# Patient Record
Sex: Female | Born: 1945 | Race: White | Hispanic: No | Marital: Married | Smoking: Former smoker
Health system: Southern US, Community
[De-identification: ages and names within clinical notes are randomized; demographics above are authoritative.]

## PROBLEM LIST (undated history)

## (undated) DIAGNOSIS — I483 Typical atrial flutter: Secondary | ICD-10-CM

## (undated) DIAGNOSIS — Z9289 Personal history of other medical treatment: Secondary | ICD-10-CM

## (undated) DIAGNOSIS — I4892 Unspecified atrial flutter: Secondary | ICD-10-CM

## (undated) DIAGNOSIS — I10 Essential (primary) hypertension: Secondary | ICD-10-CM

## (undated) DIAGNOSIS — I480 Paroxysmal atrial fibrillation: Secondary | ICD-10-CM

## (undated) DIAGNOSIS — F32A Depression, unspecified: Secondary | ICD-10-CM

## (undated) DIAGNOSIS — E785 Hyperlipidemia, unspecified: Secondary | ICD-10-CM

## (undated) DIAGNOSIS — F329 Major depressive disorder, single episode, unspecified: Secondary | ICD-10-CM

## (undated) HISTORY — DX: Typical atrial flutter: I48.3

## (undated) HISTORY — PX: COLONOSCOPY: SHX174

## (undated) HISTORY — PX: TUBAL LIGATION: SHX77

## (undated) HISTORY — DX: Essential (primary) hypertension: I10

## (undated) HISTORY — DX: Personal history of other medical treatment: Z92.89

## (undated) HISTORY — DX: Paroxysmal atrial fibrillation: I48.0

## (undated) HISTORY — DX: Depression, unspecified: F32.A

## (undated) HISTORY — PX: CARPAL TUNNEL RELEASE: SHX101

## (undated) HISTORY — PX: FOOT SURGERY: SHX648

## (undated) HISTORY — DX: Unspecified atrial flutter: I48.92

## (undated) HISTORY — DX: Hyperlipidemia, unspecified: E78.5

## (undated) HISTORY — PX: APPENDECTOMY: SHX54

## (undated) HISTORY — DX: Major depressive disorder, single episode, unspecified: F32.9

---

## 1971-08-08 DIAGNOSIS — K759 Inflammatory liver disease, unspecified: Secondary | ICD-10-CM

## 1971-08-08 HISTORY — DX: Inflammatory liver disease, unspecified: K75.9

## 1996-08-07 HISTORY — PX: ROTATOR CUFF REPAIR: SHX139

## 1998-08-07 HISTORY — PX: KNEE SURGERY: SHX244

## 2007-05-09 ENCOUNTER — Ambulatory Visit: Payer: Self-pay | Admitting: Family Medicine

## 2007-06-03 ENCOUNTER — Ambulatory Visit: Payer: Self-pay | Admitting: Gastroenterology

## 2008-05-11 ENCOUNTER — Ambulatory Visit: Payer: Self-pay | Admitting: Family Medicine

## 2009-05-13 ENCOUNTER — Ambulatory Visit: Payer: Self-pay | Admitting: Internal Medicine

## 2010-01-25 LAB — HM PAP SMEAR: HM Pap smear: NORMAL

## 2010-05-20 ENCOUNTER — Ambulatory Visit: Payer: Self-pay | Admitting: Internal Medicine

## 2011-05-10 ENCOUNTER — Telehealth: Payer: Self-pay | Admitting: Internal Medicine

## 2011-05-10 DIAGNOSIS — Z1239 Encounter for other screening for malignant neoplasm of breast: Secondary | ICD-10-CM

## 2011-05-10 NOTE — Telephone Encounter (Signed)
On your printer

## 2011-05-10 NOTE — Telephone Encounter (Signed)
Patient needs order for mammogram to be faxed to [facility].

## 2011-05-10 NOTE — Telephone Encounter (Signed)
Pt called she received letter from the hospital stating its time for her mammogram  She would like you to set this up

## 2011-05-31 ENCOUNTER — Ambulatory Visit: Payer: Self-pay | Admitting: Internal Medicine

## 2011-07-17 ENCOUNTER — Other Ambulatory Visit (INDEPENDENT_AMBULATORY_CARE_PROVIDER_SITE_OTHER): Payer: Medicare Other | Admitting: *Deleted

## 2011-07-17 DIAGNOSIS — E785 Hyperlipidemia, unspecified: Secondary | ICD-10-CM

## 2011-07-17 LAB — LIPID PANEL
HDL: 46.2 mg/dL (ref >39.00)
Total CHOL/HDL Ratio: 3
Triglycerides: 123 mg/dL (ref 0.0–149.0)

## 2011-07-24 ENCOUNTER — Encounter: Payer: Self-pay | Admitting: Internal Medicine

## 2011-07-24 ENCOUNTER — Ambulatory Visit (INDEPENDENT_AMBULATORY_CARE_PROVIDER_SITE_OTHER): Payer: Medicare Other | Admitting: Internal Medicine

## 2011-07-24 VITALS — BP 128/70 | HR 72 | Temp 98.7°F | Wt 173.5 lb

## 2011-07-24 DIAGNOSIS — G4700 Insomnia, unspecified: Secondary | ICD-10-CM

## 2011-07-24 DIAGNOSIS — I10 Essential (primary) hypertension: Secondary | ICD-10-CM

## 2011-07-24 DIAGNOSIS — Z79899 Other long term (current) drug therapy: Secondary | ICD-10-CM

## 2011-07-24 DIAGNOSIS — R197 Diarrhea, unspecified: Secondary | ICD-10-CM

## 2011-07-24 DIAGNOSIS — F324 Major depressive disorder, single episode, in partial remission: Secondary | ICD-10-CM | POA: Insufficient documentation

## 2011-07-24 DIAGNOSIS — E782 Mixed hyperlipidemia: Secondary | ICD-10-CM | POA: Insufficient documentation

## 2011-07-24 DIAGNOSIS — Z124 Encounter for screening for malignant neoplasm of cervix: Secondary | ICD-10-CM

## 2011-07-24 DIAGNOSIS — E785 Hyperlipidemia, unspecified: Secondary | ICD-10-CM

## 2011-07-24 DIAGNOSIS — L600 Ingrowing nail: Secondary | ICD-10-CM

## 2011-07-24 MED ORDER — SIMVASTATIN 40 MG PO TABS: 40.0000 mg | ORAL_TABLET | Freq: Every day | ORAL | Status: DC

## 2011-07-24 MED ORDER — LISINOPRIL 30 MG PO TABS: 30.0000 mg | ORAL_TABLET | Freq: Every day | ORAL | Status: DC

## 2011-07-24 MED ORDER — SERTRALINE HCL 100 MG PO TABS: 100.0000 mg | ORAL_TABLET | Freq: Every day | ORAL | Status: DC

## 2011-07-24 MED ORDER — HYDROCHLOROTHIAZIDE 25 MG PO TABS: 25.0000 mg | ORAL_TABLET | Freq: Every day | ORAL | Status: DC

## 2011-07-24 MED ORDER — TRAZODONE HCL 100 MG PO TABS: 100.0000 mg | ORAL_TABLET | Freq: Every day | ORAL | Status: DC

## 2011-07-24 NOTE — Assessment & Plan Note (Signed)
Etiology unclear.  Will rule out infectious etiologies with stool cultures

## 2011-07-24 NOTE — Progress Notes (Signed)
  Subjective:    Patient ID: Katherine Hudson, female    DOB: [birth year], 65 y.o.   MRN: [id number]  HPI  65 yo white female presentss with 3 weeks of change in bowel habitsw with loose stools alternating with constipation . She notes some cramping with loose stools .  No blood, no fever,  Occasional nausea and near vomiting,  Last colonoscopy was  3 yrs ago at [facility]  by unknown practitioner.  Has had recent travel to [place], [place] .  No sick contacts.    Past Medical History  Diagnosis Date  . Hyperlipidemia   . Hypertension   . Depression    No current outpatient prescriptions on file prior to visit.     Review of Systems  Constitutional: Negative for fever, chills and unexpected weight change.  HENT: Negative for hearing loss, ear pain, nosebleeds, congestion, sore throat, facial swelling, rhinorrhea, sneezing, mouth sores, trouble swallowing, neck pain, neck stiffness, voice change, postnasal drip, sinus pressure, tinnitus and ear discharge.   Eyes: Negative for pain, discharge, redness and visual disturbance.  Respiratory: Negative for cough, chest tightness, shortness of breath, wheezing and stridor.   Cardiovascular: Negative for chest pain, palpitations and leg swelling.  Musculoskeletal: Negative for myalgias and arthralgias.  Skin: Negative for color change and rash.  Neurological: Negative for dizziness, weakness, light-headedness and headaches.  Hematological: Negative for adenopathy.       Objective:   Physical Exam  Constitutional: She is oriented to person, place, and time. She appears well-developed and well-nourished.  HENT:  Mouth/Throat: Oropharynx is clear and moist.  Eyes: EOM are normal. Pupils are equal, round, and reactive to light. No scleral icterus.  Neck: Normal range of motion. Neck supple. No JVD present. No thyromegaly present.  Cardiovascular: Normal rate, regular rhythm, normal heart sounds and intact distal pulses.   Pulmonary/Chest: Effort normal and  breath sounds normal.  Abdominal: Soft. Bowel sounds are normal. She exhibits no mass. There is no tenderness.  Musculoskeletal: Normal range of motion. She exhibits no edema.  Lymphadenopathy:    She has no cervical adenopathy.  Neurological: She is alert and oriented to person, place, and time.  Skin: Skin is warm and dry.  Psychiatric: She has a normal mood and affect.      Assessment & Plan:

## 2011-07-24 NOTE — Assessment & Plan Note (Signed)
Well controlled on current meds,  No changes

## 2011-07-24 NOTE — Assessment & Plan Note (Signed)
Well controlled on current statin by recent labs. No changes today.

## 2011-07-25 LAB — COMPREHENSIVE METABOLIC PANEL
BUN: 21 mg/dL (ref 6–23)
CO2: 24 mEq/L (ref 19–32)
Calcium: 9.4 mg/dL (ref 8.4–10.5)
Chloride: 112 mEq/L (ref 96–112)
Creatinine, Ser: 0.9 mg/dL (ref 0.4–1.2)
GFR: 67.54 mL/min (ref >60.00)
Glucose, Bld: 104 mg/dL — ABNORMAL HIGH (ref 70–99)
Total Bilirubin: 0.5 mg/dL (ref 0.3–1.2)

## 2011-07-28 LAB — FECAL LACTOFERRIN, QUANT: Lactoferrin: NEGATIVE

## 2011-07-28 LAB — OVA AND PARASITE SCREEN: OP: NONE SEEN

## 2011-07-28 LAB — CLOSTRIDIUM DIFFICILE EIA: CDIFTX: NEGATIVE

## 2011-07-31 LAB — STOOL CULTURE

## 2011-08-04 ENCOUNTER — Ambulatory Visit: Payer: Medicare Other | Admitting: Internal Medicine

## 2011-08-15 ENCOUNTER — Other Ambulatory Visit: Payer: Self-pay | Admitting: Internal Medicine

## 2011-08-15 ENCOUNTER — Other Ambulatory Visit: Payer: Self-pay | Admitting: *Deleted

## 2011-08-15 DIAGNOSIS — G4700 Insomnia, unspecified: Secondary | ICD-10-CM

## 2011-08-15 NOTE — Telephone Encounter (Signed)
Faxed request from [facility] [address], last filled today.  This request is for future refills.

## 2011-08-16 MED ORDER — SERTRALINE HCL 100 MG PO TABS: 100.0000 mg | ORAL_TABLET | Freq: Every day | ORAL | Status: DC

## 2011-08-28 ENCOUNTER — Telehealth: Payer: Self-pay | Admitting: Internal Medicine

## 2011-08-28 MED ORDER — FLUTICASONE PROPIONATE 50 MCG/ACT NA SUSP: 2.0000 | Freq: Every day | NASAL | Status: DC | PRN

## 2011-08-28 NOTE — Telephone Encounter (Signed)
[phone]  cell  Pt call to get refill on fluticafone 50 cg  1 daily at night [facility] [address]

## 2011-08-29 ENCOUNTER — Telehealth: Payer: Self-pay | Admitting: *Deleted

## 2011-08-29 DIAGNOSIS — G4700 Insomnia, unspecified: Secondary | ICD-10-CM

## 2011-08-29 NOTE — Telephone Encounter (Signed)
Patient says that the rx for the Sertraline that was sent in for her is incorrect. This was done on 08-15-11. She says that it was sent in as 100 mg daily and she has been taking 150 mg daily.Patient is asking for a new script to be sent to pharmacy.

## 2011-08-30 MED ORDER — SERTRALINE HCL 50 MG PO TABS: 150.0000 mg | ORAL_TABLET | Freq: Every day | ORAL | Status: DC

## 2011-08-30 NOTE — Telephone Encounter (Signed)
Left message asking patient to return my call.

## 2011-08-30 NOTE — Telephone Encounter (Signed)
Sent rx for #90 50 mg tablets  (150 mg total)

## 2011-09-01 NOTE — Telephone Encounter (Signed)
Left message informing patient.

## 2012-01-16 ENCOUNTER — Encounter: Payer: Self-pay | Admitting: Internal Medicine

## 2012-01-29 ENCOUNTER — Telehealth: Payer: Self-pay | Admitting: Internal Medicine

## 2012-01-29 NOTE — Telephone Encounter (Signed)
Caller: Katherine Hudson/Patient; PCP: Duncan Dull; CB#: [phone];  Call regarding Diarrhea and Nausea;  Onset: 01/25/12.  Temp unknown.  Reports at least 6 watery stools qd.  Advised to see MD within 24 hrs for diarrhea lasting > 24 hrs and not improving with home care per Diarrhea or Other Change in Bowel Habits. No appts remain for 01/29/12 o 01/30/12.  Info noted and sent to [facility] for work in appt.  Please call pt.

## 2012-01-29 NOTE — Telephone Encounter (Signed)
Called and advised patient the BRATS diet, no dairy, drink plenty of clear liquids and call us if symptoms have not improved in a couple of day. Patient agreed.

## 2012-03-01 ENCOUNTER — Encounter: Payer: Self-pay | Admitting: *Deleted

## 2012-03-08 ENCOUNTER — Encounter: Payer: Self-pay | Admitting: Internal Medicine

## 2012-03-25 LAB — HM PAP SMEAR: HM Pap smear: NORMAL

## 2012-03-27 ENCOUNTER — Ambulatory Visit (INDEPENDENT_AMBULATORY_CARE_PROVIDER_SITE_OTHER): Payer: Medicare Other | Admitting: Internal Medicine

## 2012-03-27 ENCOUNTER — Encounter: Payer: Self-pay | Admitting: Internal Medicine

## 2012-03-27 ENCOUNTER — Other Ambulatory Visit (HOSPITAL_COMMUNITY)
Admission: RE | Admit: 2012-03-27 | Discharge: 2012-03-27 | Disposition: A | Discharge status: Home / Self Care | Payer: Medicare Other | Source: Ambulatory Visit | Attending: Internal Medicine | Admitting: Internal Medicine

## 2012-03-27 VITALS — BP 120/68 | HR 64 | Temp 98.0°F | Resp 14 | Ht 65.0 in | Wt 178.0 lb

## 2012-03-27 DIAGNOSIS — R5383 Other fatigue: Secondary | ICD-10-CM

## 2012-03-27 DIAGNOSIS — F5104 Psychophysiologic insomnia: Secondary | ICD-10-CM | POA: Insufficient documentation

## 2012-03-27 DIAGNOSIS — Z79899 Other long term (current) drug therapy: Secondary | ICD-10-CM

## 2012-03-27 DIAGNOSIS — E785 Hyperlipidemia, unspecified: Secondary | ICD-10-CM

## 2012-03-27 DIAGNOSIS — Z124 Encounter for screening for malignant neoplasm of cervix: Secondary | ICD-10-CM

## 2012-03-27 DIAGNOSIS — R0683 Snoring: Secondary | ICD-10-CM

## 2012-03-27 DIAGNOSIS — G4700 Insomnia, unspecified: Secondary | ICD-10-CM

## 2012-03-27 DIAGNOSIS — Z0000 Encounter for general adult medical examination without abnormal findings: Secondary | ICD-10-CM

## 2012-03-27 DIAGNOSIS — Z1151 Encounter for screening for human papillomavirus (HPV): Secondary | ICD-10-CM | POA: Insufficient documentation

## 2012-03-27 DIAGNOSIS — R0989 Other specified symptoms and signs involving the circulatory and respiratory systems: Secondary | ICD-10-CM

## 2012-03-27 DIAGNOSIS — I10 Essential (primary) hypertension: Secondary | ICD-10-CM

## 2012-03-27 DIAGNOSIS — R5381 Other malaise: Secondary | ICD-10-CM

## 2012-03-27 DIAGNOSIS — R6882 Decreased libido: Secondary | ICD-10-CM | POA: Insufficient documentation

## 2012-03-27 LAB — COMPREHENSIVE METABOLIC PANEL
Alkaline Phosphatase: 55 U/L (ref 39–117)
BUN: 19 mg/dL (ref 6–23)
Creatinine, Ser: 0.9 mg/dL (ref 0.4–1.2)
Glucose, Bld: 98 mg/dL (ref 70–99)
Total Bilirubin: 0.8 mg/dL (ref 0.3–1.2)

## 2012-03-27 LAB — LIPID PANEL
Cholesterol: 147 mg/dL (ref 0–200)
HDL: 46.7 mg/dL (ref >39.00)
VLDL: 26.8 mg/dL (ref 0.0–40.0)

## 2012-03-27 LAB — TSH: TSH: 1.83 u[IU]/mL (ref 0.35–5.50)

## 2012-03-27 MED ORDER — SERTRALINE HCL 50 MG PO TABS: 150.0000 mg | ORAL_TABLET | Freq: Every day | ORAL | Status: DC

## 2012-03-27 MED ORDER — HYDROCHLOROTHIAZIDE 25 MG PO TABS: 25.0000 mg | ORAL_TABLET | Freq: Every day | ORAL | Status: DC

## 2012-03-27 MED ORDER — TRAZODONE HCL 100 MG PO TABS: 100.0000 mg | ORAL_TABLET | Freq: Every day | ORAL | Status: DC

## 2012-03-27 MED ORDER — LISINOPRIL 30 MG PO TABS: 30.0000 mg | ORAL_TABLET | Freq: Every day | ORAL | Status: DC

## 2012-03-27 MED ORDER — SIMVASTATIN 40 MG PO TABS: 40.0000 mg | ORAL_TABLET | Freq: Every day | ORAL | Status: DC

## 2012-03-27 MED ORDER — BUPROPION HCL ER (SR) 150 MG PO TB12: 150.0000 mg | ORAL_TABLET | Freq: Two times a day (BID) | ORAL | Status: DC

## 2012-03-27 MED ORDER — FLUTICASONE PROPIONATE 50 MCG/ACT NA SUSP: 2.0000 | Freq: Every day | NASAL | Status: DC | PRN

## 2012-03-27 NOTE — Assessment & Plan Note (Signed)
Well controlled on current regimen. LDL is at goal on current statin dose and LFTS are normal. No changes today. 

## 2012-03-27 NOTE — Assessment & Plan Note (Signed)
Well controlled on current regimen. Renal function stable, no changes today. 

## 2012-03-27 NOTE — Progress Notes (Signed)
Patient ID: Katherine Hudson, female   DOB: Sep 07, 1945, 66 y.o.   MRN: [id number]  The patient is here for annual Medicare wellness examination and management of other chronic and acute problems. She needs a PAP .  Cc: Poor sleep.  Persistent fatigue.  Snoring,  Frequent wakenings.  Non restorative sleep.  Had a remote home study which showed desats.   Needs sleep study test.  2) No libido, even on vacation.  Not due to anticipated pain from vaginal dryness.  Has been gone for years.  Happy in marriage.     The risk factors are reflected in the social history.  The roster of all physicians providing medical care to patient - is listed in the Snapshot section of the chart.  Activities of daily living:  The patient is 100% independent in all ADLs: dressing, toileting, feeding as well as independent mobility  Home safety : The patient has smoke detectors in the home. They wear seatbelts.  There are no firearms at home. There is no violence in the home.   There is no risks for hepatitis, STDs or HIV. There is no   history of blood transfusion. They have no travel history to infectious disease endemic areas of the world.  The patient has seen their dentist in the last six month. They have seen their eye doctor in the last year. They admit to slight hearing difficulty with regard to whispered voices and some television programs.  They have deferred audiologic testing in the last year.  They do not  have excessive sun exposure. Discussed the need for sun protection: hats, long sleeves and use of sunscreen if there is significant sun exposure.   Diet: the importance of a healthy diet is discussed. They do have a healthy diet.  The benefits of regular aerobic exercise were discussed. She walks 2 times per week ,  20 minutes.   Depression screen:her depression is well managed.  there are no signs or vegative symptoms of depression- irritability, change in appetite, anhedonia, sadness/tearfullness.  Cognitive  assessment: the patient manages all their financial and personal affairs and is actively engaged. They could relate day,date,year and events; recalled 2/3 objects at 3 minutes; performed clock-face test normally.  The following portions of the patient's history were reviewed and updated as appropriate: allergies, current medications, past family history, past medical history,  past surgical history, past social history  and problem list.  Visual acuity was not assessed per patient preference since she has regular follow up with her ophthalmologist. Hearing and body mass index were assessed and reviewed.   During the course of the visit the patient was educated and counseled about appropriate screening and preventive services including : fall prevention , diabetes screening, nutrition counseling, colorectal cancer screening, and recommended immunizations.    BP 120/68  Pulse 64  Temp 98 F (36.7 C) (Oral)  Resp 14  Ht 5\' 5"  (1.651 m)  Wt 178 lb (80.74 kg)  BMI 29.62 kg/m2  SpO2 95%  General Appearance:    Alert, cooperative, no distress, appears stated age  Head:    Normocephalic, without obvious abnormality, atraumatic  Eyes:    PERRL, conjunctiva/corneas clear, EOM's intact, fundi    benign, both eyes  Ears:    Normal TM's and external ear canals, both ears  Nose:   Nares normal, septum midline, mucosa normal, no drainage    or sinus tenderness  Throat:   Lips, mucosa, and tongue normal; teeth and gums normal  Neck:  Supple, symmetrical, trachea midline, no adenopathy;    thyroid:  no enlargement/tenderness/nodules; no carotid   bruit or JVD  Back:     Symmetric, no curvature, ROM normal, no CVA tenderness  Lungs:     Clear to auscultation bilaterally, respirations unlabored  Chest Wall:    No tenderness or deformity   Heart:    Regular rate and rhythm, S1 and S2 normal, no murmur, rub   or gallop  Breast Exam:    No tenderness, masses, or nipple abnormality  Abdomen:     Soft,  non-tender, bowel sounds active all four quadrants,    no masses, no organomegaly  Genitalia:    Normal female without lesion, discharge or tenderness     Extremities:   Extremities normal, atraumatic, no cyanosis or edema  Pulses:   2+ and symmetric all extremities  Skin:   Skin color, texture, turgor normal, no rashes or lesions  Lymph nodes:   Cervical, supraclavicular, and axillary nodes normal  Neurologic:   CNII-XII intact, normal strength, sensation and reflexes    Throughout    Assessment and Plan  Chronic insomnia With snoring, hypersomnolence, hypertesnion.  Sleep study ordered.   Loss of libido Likely secondary to chronic use of sertraline.  VAginal exam was not atrophic.  Trial of wellbutrin as a substitute for sertraline.   Screening for cervical cancer PAP was done today.  Last one was 2011, normal.   Hypertension Well controlled on current regimen. Renal function stable, no changes today.  Hyperlipidemia Well controlled on current regimen. LDL is at goal on current statin dose and LFTS are normal. No changes today.    Updated Medication List Outpatient Encounter Prescriptions as of 03/27/2012  Medication Sig Dispense Refill  . aspirin 81 MG tablet Take 81 mg by mouth daily.        Marland Kitchen co-enzyme Q-10 30 MG capsule Take 30 mg by mouth 3 (three) times daily.      . fluticasone (FLONASE) 50 MCG/ACT nasal spray Place 2 sprays into the nose daily as needed.  16 g  5  . Glucosamine-Chondroit-Vit C-Mn (GLUCOSAMINE CHONDR 1500 COMPLX PO) Take by mouth 2 (two) times daily.        . hydrochlorothiazide (HYDRODIURIL) 25 MG tablet Take 1 tablet (25 mg total) by mouth daily.  90 tablet  3  . ibuprofen (ADVIL,MOTRIN) 200 MG tablet Take 200 mg by mouth as needed.        Marland Kitchen KRILL OIL PO Take by mouth daily.      Marland Kitchen lisinopril (PRINIVIL,ZESTRIL) 30 MG tablet Take 1 tablet (30 mg total) by mouth daily.  90 tablet  3  . loratadine (CLARITIN) 10 MG tablet Take 10 mg by mouth daily.        . methocarbamol (ROBAXIN) 750 MG tablet Take 750 mg by mouth at bedtime as needed.      . Multiple Vitamin (MULTIVITAMIN) tablet Take 1 tablet by mouth daily.        . simvastatin (ZOCOR) 40 MG tablet Take 1 tablet (40 mg total) by mouth at bedtime.  90 tablet  3  . traMADol (ULTRAM) 50 MG tablet Take 50 mg by mouth at bedtime as needed.      . traZODone (DESYREL) 100 MG tablet Take 1 tablet (100 mg total) by mouth at bedtime.  90 tablet  3  . DISCONTD: fluticasone (FLONASE) 50 MCG/ACT nasal spray Place 2 sprays into the nose daily as needed.  4 g  2  .  DISCONTD: hydrochlorothiazide (HYDRODIURIL) 25 MG tablet Take 1 tablet (25 mg total) by mouth daily.  90 tablet  1  . DISCONTD: hydrochlorothiazide (HYDRODIURIL) 25 MG tablet Take 1 tablet (25 mg total) by mouth daily.  90 tablet  3  . DISCONTD: lisinopril (PRINIVIL,ZESTRIL) 30 MG tablet TAKE ONE TABLET BY MOUTH EVERY DAY  90 tablet  3  . DISCONTD: lisinopril (PRINIVIL,ZESTRIL) 30 MG tablet Take 1 tablet (30 mg total) by mouth daily.  90 tablet  3  . DISCONTD: sertraline (ZOLOFT) 50 MG tablet Take 3 tablets (150 mg total) by mouth daily.  90 tablet  6  . DISCONTD: sertraline (ZOLOFT) 50 MG tablet Take 3 tablets (150 mg total) by mouth daily.  270 tablet  3  . DISCONTD: simvastatin (ZOCOR) 40 MG tablet Take 1 tablet (40 mg total) by mouth at bedtime.  90 tablet  1  . DISCONTD: traZODone (DESYREL) 100 MG tablet Take 1 tablet (100 mg total) by mouth at bedtime.  90 tablet  3  . buPROPion (WELLBUTRIN SR) 150 MG 12 hr tablet Take 1 tablet (150 mg total) by mouth 2 (two) times daily.  60 tablet  3  . DISCONTD: calcium gluconate 500 MG tablet Take 500 mg by mouth daily.        Marland Kitchen DISCONTD: Omega-3 Fatty Acids (FISH OIL) 1200 MG CAPS Take by mouth daily.        Marland Kitchen DISCONTD: polycarbophil (FIBERCON) 625 MG tablet Take by mouth 3 times a week

## 2012-03-27 NOTE — Assessment & Plan Note (Signed)
PAP was done today.  Last one was 2011, normal.

## 2012-03-27 NOTE — Assessment & Plan Note (Signed)
With snoring, hypersomnolence, hypertesnion.  Sleep study ordered.

## 2012-03-27 NOTE — Assessment & Plan Note (Signed)
Likely secondary to chronic use of sertraline.  VAginal exam was not atrophic.  Trial of wellbutrin as a substitute for sertraline.

## 2012-03-27 NOTE — Patient Instructions (Addendum)
We are substituting wellbutrin for zoloft to see if it helps your lack of libido.,  Start with one tablet daily in the morning,  You can add the second tablet after one week either in the morning or by 2 pm  Daily

## 2012-04-04 ENCOUNTER — Ambulatory Visit: Payer: Self-pay | Admitting: Internal Medicine

## 2012-04-24 ENCOUNTER — Telehealth: Payer: Self-pay | Admitting: Internal Medicine

## 2012-04-24 NOTE — Telephone Encounter (Signed)
Have you seen patients results?  They are not scanned in her chart.

## 2012-04-24 NOTE — Telephone Encounter (Signed)
Pt came in today she stated she had sleep study done in end of August @ [facility].  Pt stated she has not received the results yet and she will be leaving Monday to go out of town for 6 months.  Please  Advise pt of results

## 2012-04-25 NOTE — Telephone Encounter (Signed)
Patient advised as instructed via telephone, she stated that she leaves for [place] on Monday and she doesn't know if she can order the supplies in [place] or not.  She would like a call as soon as the results are back.

## 2012-04-25 NOTE — Telephone Encounter (Signed)
Pt came in today checking on her results.  i had pt signed medical release form and faxed request to get results

## 2012-04-25 NOTE — Telephone Encounter (Signed)
[facility] Has not sent me the results.  I [facility] with [facility] and Dr. Meredeth Ide has not finished interpreting it yet.  Please apologize to patient and let her know we will call her when we get the results and order the supplies if it is positive.

## 2012-05-01 ENCOUNTER — Telehealth: Payer: Self-pay | Admitting: Internal Medicine

## 2012-05-01 DIAGNOSIS — G2581 Restless legs syndrome: Secondary | ICD-10-CM

## 2012-05-01 NOTE — Telephone Encounter (Signed)
Her sleep study was negative for sleep apnea but positive for snoring about 155 of the time,  And postive for restless legs .  If she would like to try a medicatino for the restless legs to take at bedtime ,.  Let me know

## 2012-05-02 DIAGNOSIS — G2581 Restless legs syndrome: Secondary | ICD-10-CM | POA: Insufficient documentation

## 2012-05-02 MED ORDER — PRAMIPEXOLE DIHYDROCHLORIDE 0.25 MG PO TABS: ORAL_TABLET | ORAL | Status: DC

## 2012-05-02 NOTE — Telephone Encounter (Signed)
Ok i have sent rx to [facility].  Have her start with `1/2 tablet daily 2 to 3 hours before bedtime.  She may increase by 1/2 tablet every week up to a maximum dose of 2 tablets (5 mg)  daily .   Headache,  insomnia , and nausea are the most common side effects .  I have printed patient information; mail to patient

## 2012-05-02 NOTE — Telephone Encounter (Signed)
Spoke with patient and advised her of the medication and side effects.  The information that was printed has been mailed to patient.

## 2012-05-02 NOTE — Telephone Encounter (Signed)
Left message asking patient to return my call.

## 2012-05-02 NOTE — Assessment & Plan Note (Signed)
Trial of mirapex.  

## 2012-05-02 NOTE — Telephone Encounter (Signed)
Patient notified.  She does want to try a medication for restless legs.  Please advise.

## 2012-05-02 NOTE — Patient Instructions (Signed)
Restless Legs Syndrome  Restless legs syndrome is a movement disorder. It may also be called a sensori-motor disorder.   CAUSES   No one knows what specifically causes restless legs syndrome, but it tends to run in families. It is also more common in people with low iron, in pregnancy, in people who need dialysis, and those with nerve damage (neuropathy).Some medications may make restless legs syndrome worse.Those medications include drugs to treat high blood pressure, some heart conditions, nausea, colds, allergies, and depression.  SYMPTOMS  Symptoms include uncomfortable sensations in the legs. These leg sensations are worse during periods of inactivity or rest. They are also worse while sitting or lying down. Individuals that have the disorder describe sensations in the legs that feel like:   Pulling.   Drawing.   Crawling.   Worming.   Boring.   Tingling.   Pins and needles.   Prickling.   Pain.  The sensations are usually accompanied by an overwhelming urge to move the legs. Sudden muscle jerks may also occur. Movement provides temporary relief from the discomfort. In rare cases, the arms may also be affected. Symptoms may interfere with going to sleep (sleep onset insomnia). Restless legs syndrome may also be related to periodic limb movement disorder (PLMD). PLMD is another more common motor disorder. It also causes interrupted sleep. The symptoms from PLMD usually occur most often when you are awake.  TREATMENT   Treatment for restless legs syndrome is symptomatic. This means that the symptoms are treated.    Massage and cold compresses may provide temporary relief.   Walk, stretch, or take a cold or hot bath.   Get regular exercise and a good night's sleep.   Avoid caffeine, alcohol, nicotine, and medications that can make it worse.   Do activities that provide mental stimulation like discussions, needlework, and video games. These may be helpful if you are not able to walk or  stretch.  Some medications are effective in relieving the symptoms. However, many of these medications have side effects. Ask your caregiver about medications that may help your symptoms. Correcting iron deficiency may improve symptoms for some patients.  Document Released: 07/14/2002 Document Revised: 07/13/2011 Document Reviewed: 10/20/2010  Exit[employer] Patient Information 2012 [employer].

## 2012-08-10 ENCOUNTER — Other Ambulatory Visit: Payer: Self-pay | Admitting: Internal Medicine

## 2012-09-09 ENCOUNTER — Ambulatory Visit (INDEPENDENT_AMBULATORY_CARE_PROVIDER_SITE_OTHER): Payer: Medicare Other | Admitting: Internal Medicine

## 2012-09-09 ENCOUNTER — Encounter: Payer: Self-pay | Admitting: Internal Medicine

## 2012-09-09 VITALS — BP 126/72 | HR 62 | Temp 98.1°F | Resp 16 | Wt 186.8 lb

## 2012-09-09 DIAGNOSIS — Z79899 Other long term (current) drug therapy: Secondary | ICD-10-CM

## 2012-09-09 DIAGNOSIS — R21 Rash and other nonspecific skin eruption: Secondary | ICD-10-CM

## 2012-09-09 DIAGNOSIS — R350 Frequency of micturition: Secondary | ICD-10-CM

## 2012-09-09 DIAGNOSIS — N390 Urinary tract infection, site not specified: Secondary | ICD-10-CM

## 2012-09-09 LAB — COMPREHENSIVE METABOLIC PANEL
AST: 24 U/L (ref 0–37)
Alkaline Phosphatase: 58 U/L (ref 39–117)
BUN: 21 mg/dL (ref 6–23)
Creatinine, Ser: 1 mg/dL (ref 0.4–1.2)
Potassium: 4.1 mEq/L (ref 3.5–5.1)
Total Bilirubin: 0.7 mg/dL (ref 0.3–1.2)

## 2012-09-09 LAB — POCT URINALYSIS DIPSTICK
Ketones, UA: NEGATIVE
Protein, UA: NEGATIVE
Urobilinogen, UA: 0.2

## 2012-09-09 MED ORDER — CIPROFLOXACIN HCL 250 MG PO TABS: 250.0000 mg | ORAL_TABLET | Freq: Two times a day (BID) | ORAL | Status: DC

## 2012-09-09 NOTE — Patient Instructions (Addendum)
I am treating you for a UTI with cipro for 5 days  Your chest wall skin looks irritated by no cancer lesions,  Try eucerin skin cream and protect from sun  try a MVI with biotin for your nails.    You need to lose 10%  Of your current body weight over the next 6 months   This is  my version of a  "Low GI"  Diet:  It is not ultra low carb, but will still lower your blood sugars and allow you to lose 5 to 10 lbs per month if you follow it carefully. All of the foods can be found at grocery stores and in bulk at [employer].  The Atkins protein bars and shakes are available in more varieties at [employer], [employer] and [employer].     7 AM Breakfast:  Low carbohydrate Protein  Shakes (I recommend the EAS AdvantEdge "Carb Control" shakes  Or the low carb shakes by Atkins.   Both are available everywhere:  In  cases at [employer]Js  Or in 4 packs at grocery stores and pharmacies  2.5 carbs  (Alternative is  a toasted Arnold's Sandwhich Thin w/ peanut butter, a "Bagel Thin" with cream cheese and salmon) or  a scrambled egg burrito made with a low carb tortilla .  Avoid cereal and bananas, oatmeal too unless you are cooking the old fashioned kind that takes 30-40 minutes to prepare.  the rest is overly processed, has minimal fiber, and is loaded with carbohydrates!   10 AM: Protein bar by Atkins (the snack size, under 200 cal).  There are many varieties , available widely again or in bulk in limited varieties at [employer]Js)  Other so called "protein bars" tend to be loaded with carbohydrates.  Remember, in food advertising, the word "energy" is synonymous for " carbohydrate."  Lunch: sandwich of [place], (or any lunchmeat, grilled meat or canned tuna), fresh avocado, mayonnaise  and cheese on a lower carbohydrate pita bread, flatbread, or tortilla . Ok to use regular mayonnaise. The bread is the only source or carbohydrate that can be decreased (Joseph's makes a pita bread and a flat bread that are 50 cal and 4 net carbs ;  Toufayan makes a low carb flatbread that's 100 cal and 9 net carbs  and  [employer] makes a low carb whole wheat tortilla  That is 210 cal and 6 net carbs)  3 PM:  Mid day :  Another protein bar,  Or a  cheese stick (100 cal, 0 carbs),  Or 1 ounce of  almonds, walnuts, pistachios, pecans, peanuts,  Macadamia nuts. Or a Dannon light n Fit greek yogurt, 80 cal 8 net carbs . Avoid "granola"; the dried cranberries and raisins are loaded with carbohydrates. Mixed nuts ok if no raisins or cranberries or dried fruit.      6 PM  Dinner:  "mean and green:"  Meat/chicken/fish or a high protein legume; , with a green salad, and a low GI  Veggie (broccoli, cauliflower, green beans, spinach, brussel sprouts. Lima beans) : Avoid "Low fat dressings, as well as Reyne Dumas and [address]! They are loaded with sugar! Instead use ranch, vinagrette,  Blue cheese, etc.  There is a low carb pasta by Dreamfield's available at [facility] that is acceptable and tastes great. Try Michel Angel's chicken piccata over low carb pasta. The chicken dish is 0 carbs, and can be found in frozen section at [employer]Js and [employer]. Also try [employer] "  Carnitas" (pulled pork, no sauce,  0 carbs) and his pot roast.   both are in the refrigerated section at BJs   Dreamfield's makes a low carb pasta only 5 g/serving.  Available at all grocery stores,  And tastes like normal pasta  9 PM snack : Breyer's "low carb" fudgsicle or  ice cream bar (Carb Smart line), or  Weight Watcher's ice cream bar , or another "no sugar added" ice cream;a serving of fresh berries/cherries with whipped cream (Avoid bananas, pineapple, grapes  and watermelon on a regular basis because they are high in sugar)   Remember that snack Substitutions should be less than 10 carbs per serving and meals < 20 carbs. Remember to subtract fiber grams and sugar alcohols to get the "net carbs."

## 2012-09-09 NOTE — Assessment & Plan Note (Signed)
Empiric antibiotics prescribed.  

## 2012-09-09 NOTE — Progress Notes (Signed)
Patient ID: Katherine Hudson, female   DOB: [birth year], 67 y.o.   MRN: [id number]  Patient Active Problem List  Diagnosis  . Screening for cervical cancer  . Hyperlipidemia  . Hypertension  . Depression  . Diarrhea  . Chronic insomnia  . Loss of libido  . Restless legs syndrome  . UTI (lower urinary tract infection)  . Rash of neck    Subjective:  CC:   Chief Complaint  Patient presents with  . Urinary Tract Infection    HPI:   Katherine Hudson a 67 y.o. female who presents 1) Urinary frequency and urinary odor for the past month,  Was in [place]  But did not go to Urgent Care despite a month of symptoms,  No swimming while in [place] , no pools.  No discharge   2) Larry's mother died in 08-01-2023 at the age of 67 of dementia.  Her mother is 71 and in [place]   3) telangiectasias n chest,  Skin of chest wall irritated.  Has been wearing the same gold necklace for years.  No steroid use and overindulgence.  No sunscreen.    Past Medical History  Diagnosis Date  . Hyperlipidemia   . Hypertension   . Depression     Past Surgical History  Procedure Date  . Appendectomy   . Knee surgery 2000    right  . Rotator cuff repair 1998    right         The following portions of the patient's history were reviewed and updated as appropriate: Allergies, current medications, and problem list.    Review of Systems:   Patient denies headache, fevers, malaise, unintentional weight loss, skin rash, eye pain, sinus congestion and sinus pain, sore throat, dysphagia,  hemoptysis , cough, dyspnea, wheezing, chest pain, palpitations, orthopnea, edema, abdominal pain, nausea, melena, diarrhea, constipation, flank pain, dysuria, hematuria, urinary  Frequency, nocturia, numbness, tingling, seizures,  Focal weakness, Loss of consciousness,  Tremor, insomnia, depression, anxiety, and suicidal ideation.      History   Social History  . Marital Status: Married    Spouse Name: N/A    Number of  Children: N/A  . Years of Education: N/A   Occupational History  . Not on file.   Social History Main Topics  . Smoking status: Former Games developer  . Smokeless tobacco: Never Used     Comment: quit 1980  . Alcohol Use: Yes     Comment: occassionally  . Drug Use: No  . Sexually Active: Not on file   Other Topics Concern  . Not on file   Social History Narrative  . No narrative on file    Objective:  BP 126/72  Pulse 62  Temp 98.1 F (36.7 C) (Oral)  Resp 16  Wt 186 lb 12 oz (84.709 kg)  SpO2 95%  General appearance: alert, cooperative and appears stated age Ears: normal TM's and external ear canals both ears Throat: lips, mucosa, and tongue normal; teeth and gums normal Neck: no adenopathy, no carotid bruit, supple, symmetrical, trachea midline and thyroid not enlarged, symmetric, no tenderness/mass/nodules Back: symmetric, no curvature. ROM normal. No CVA tenderness. Lungs: clear to auscultation bilaterally Heart: regular rate and rhythm, S1, S2 normal, no murmur, click, rub or gallop Abdomen: soft, non-tender; bowel sounds normal; no masses,  no organomegaly Pulses: 2+ and symmetric Skin: Skin color, texture, turgor normal. Chest/neck wall containing multiple telangiectasiasNo rashes or lesions Lymph nodes: Cervical, supraclavicular, and axillary nodes normal.  Assessment and Plan:  UTI (lower urinary tract infection) Empiric antibiotics prescribed  Rash of neck Blanching, with multiple scattered telangiectasias.  recommended moisturizer and sunscreen .  No precancerous lesions noted.    Updated Medication List Outpatient Encounter Prescriptions as of 09/09/2012  Medication Sig Dispense Refill  . aspirin 81 MG tablet Take 81 mg by mouth daily.        Marland Kitchen co-enzyme Q-10 30 MG capsule Take 30 mg by mouth 3 (three) times daily.      . fluticasone (FLONASE) 50 MCG/ACT nasal spray Place 2 sprays into the nose daily as needed.  16 g  5  . Glucosamine-Chondroit-Vit C-Mn  (GLUCOSAMINE CHONDR 1500 COMPLX PO) Take by mouth 2 (two) times daily.        . hydrochlorothiazide (HYDRODIURIL) 25 MG tablet Take 1 tablet (25 mg total) by mouth daily.  90 tablet  3  . ibuprofen (ADVIL,MOTRIN) 200 MG tablet Take 200 mg by mouth as needed.        Marland Kitchen KRILL OIL PO Take by mouth daily.      Marland Kitchen lisinopril (PRINIVIL,ZESTRIL) 30 MG tablet Take 1 tablet (30 mg total) by mouth daily.  90 tablet  3  . loratadine (CLARITIN) 10 MG tablet Take 10 mg by mouth daily.      . methocarbamol (ROBAXIN) 750 MG tablet Take 750 mg by mouth at bedtime as needed.      . Multiple Vitamin (MULTIVITAMIN) tablet Take 1 tablet by mouth daily.        . pramipexole (MIRAPEX) 0.25 MG tablet 1 or 2 tablet daily at bedtime for restless legs  60 tablet  3  . sertraline (ZOLOFT) 50 MG tablet       . simvastatin (ZOCOR) 40 MG tablet Take 1 tablet (40 mg total) by mouth at bedtime.  90 tablet  3  . traMADol (ULTRAM) 50 MG tablet Take 50 mg by mouth at bedtime as needed.      . traZODone (DESYREL) 100 MG tablet Take 1 tablet (100 mg total) by mouth at bedtime.  90 tablet  3  . traZODone (DESYREL) 100 MG tablet TAKE ONE TABLET BY MOUTH AT BEDTIME  90 tablet  2  . buPROPion (WELLBUTRIN SR) 150 MG 12 hr tablet Take 1 tablet (150 mg total) by mouth 2 (two) times daily.  60 tablet  3  . ciprofloxacin (CIPRO) 250 MG tablet Take 1 tablet (250 mg total) by mouth 2 (two) times daily.  10 tablet  0     Orders Placed This Encounter  Procedures  . Urine culture  . Comprehensive metabolic panel  . POCT urinalysis dipstick    No Follow-up on file.

## 2012-09-09 NOTE — Assessment & Plan Note (Signed)
Blanching, with multiple scattered telangiectasias.  recommended moisturizer and sunscreen .  No precancerous lesions noted.

## 2012-09-10 LAB — URINE CULTURE: Colony Count: NO GROWTH

## 2012-10-03 ENCOUNTER — Other Ambulatory Visit: Payer: Self-pay | Admitting: Internal Medicine

## 2012-10-04 NOTE — Telephone Encounter (Signed)
Med filled.  

## 2012-10-24 ENCOUNTER — Other Ambulatory Visit: Payer: Self-pay | Admitting: Internal Medicine

## 2012-10-25 NOTE — Telephone Encounter (Signed)
Med filled.  

## 2013-02-01 ENCOUNTER — Other Ambulatory Visit: Payer: Self-pay | Admitting: Internal Medicine

## 2013-03-07 ENCOUNTER — Ambulatory Visit (INDEPENDENT_AMBULATORY_CARE_PROVIDER_SITE_OTHER): Payer: Medicare Other | Admitting: Internal Medicine

## 2013-03-07 ENCOUNTER — Encounter: Payer: Self-pay | Admitting: Internal Medicine

## 2013-03-07 VITALS — BP 106/60 | HR 78 | Temp 98.1°F | Resp 14 | Wt 179.5 lb

## 2013-03-07 DIAGNOSIS — N179 Acute kidney failure, unspecified: Secondary | ICD-10-CM

## 2013-03-07 DIAGNOSIS — R197 Diarrhea, unspecified: Secondary | ICD-10-CM

## 2013-03-07 DIAGNOSIS — Z1211 Encounter for screening for malignant neoplasm of colon: Secondary | ICD-10-CM

## 2013-03-07 LAB — COMPREHENSIVE METABOLIC PANEL
ALT: 20 U/L (ref 0–35)
CO2: 23 mEq/L (ref 19–32)
Calcium: 10.3 mg/dL (ref 8.4–10.5)
Chloride: 106 mEq/L (ref 96–112)
Creat: 1.43 mg/dL — ABNORMAL HIGH (ref 0.50–1.10)
Glucose, Bld: 97 mg/dL (ref 70–99)
Total Protein: 7.4 g/dL (ref 6.0–8.3)

## 2013-03-07 LAB — CBC WITH DIFFERENTIAL/PLATELET
Basophils Relative: 1 % (ref 0–1)
Eosinophils Relative: 0 % (ref 0–5)
HCT: 45.8 % (ref 36.0–46.0)
Hemoglobin: 15.6 g/dL — ABNORMAL HIGH (ref 12.0–15.0)
MCH: 28.2 pg (ref 26.0–34.0)
MCHC: 34.1 g/dL (ref 30.0–36.0)
MCV: 82.7 fL (ref 78.0–100.0)
Monocytes Absolute: 0.5 10*3/uL (ref 0.1–1.0)
Monocytes Relative: 7 % (ref 3–12)
Neutro Abs: 5.5 10*3/uL (ref 1.7–7.7)

## 2013-03-07 MED ORDER — CIPROFLOXACIN HCL 250 MG PO TABS: 250.0000 mg | ORAL_TABLET | Freq: Two times a day (BID) | ORAL | Status: DC

## 2013-03-07 MED ORDER — METRONIDAZOLE 500 MG PO TABS: 500.0000 mg | ORAL_TABLET | Freq: Three times a day (TID) | ORAL | Status: DC

## 2013-03-07 MED ORDER — CIPROFLOXACIN HCL 500 MG PO TABS: 500.0000 mg | ORAL_TABLET | Freq: Two times a day (BID) | ORAL | Status: DC

## 2013-03-07 NOTE — Progress Notes (Signed)
Patient ID: Katherine Hudson, female   DOB: 03-12-1946, 67 y.o.   MRN: [id number]   Patient Active Problem List   Diagnosis Date Noted  . Diarrhea in adult patient 03/09/2013  . UTI (lower urinary tract infection) 09/09/2012  . Rash of neck 09/09/2012  . Restless legs syndrome 05/02/2012  . Chronic insomnia 03/27/2012  . Loss of libido 03/27/2012  . Screening for cervical cancer 07/24/2011  . Diarrhea 07/24/2011  . Hyperlipidemia   . Hypertension   . Depression     Subjective:  CC:   Chief Complaint  Patient presents with  . Acute Visit    diarrhea X 1 month patient reports also fever  and chills with pain in lower  abdomen.    HPI:   Katherine Hudson a 67 y.o. female who presents with a one month history of daily loose stools occurring 7 to 8 times per day and waking her up at night,  She has not recent history of endemic travel use of antibiotics,  Or contact with sick people.  Her last colonoscopy was in  2008 by [facility],  And there was no report of diverticulosis.  She has had subjective chills and low grade fevetrs but has not measured temp.  Wt loss of 7 lbs noted , unintentional.  Has been increasing her daily fiber with no improvement.  Waking up with fecal in continence.  Stools occur about 30 minutes after eating,  Accompanied by mild LLQ cramping.     Past Medical History  Diagnosis Date  . Hyperlipidemia   . Hypertension   . Depression     Past Surgical History  Procedure Laterality Date  . Appendectomy    . Knee surgery  2000    right  . Rotator cuff repair  1998    right       The following portions of the patient's history were reviewed and updated as appropriate: Allergies, current medications, and problem list.    Review of Systems:   12 Pt  review of systems was negative except those addressed in the HPI,     History   Social History  . Marital Status: Married    Spouse Name: N/A    Number of Children: N/A  . Years of Education: N/A    Occupational History  . Not on file.   Social History Main Topics  . Smoking status: Former Games developer  . Smokeless tobacco: Never Used     Comment: quit 1980  . Alcohol Use: Yes     Comment: occassionally  . Drug Use: No  . Sexually Active: Not on file   Other Topics Concern  . Not on file   Social History Narrative  . No narrative on file    Objective:  BP 106/60  Pulse 78  Temp(Src) 98.1 F (36.7 C) (Oral)  Resp 14  Wt 179 lb 8 oz (81.421 kg)  BMI 29.87 kg/m2  SpO2 98%  General appearance: alert, cooperative and appears stated age Ears: normal TM's and external ear canals both ears Throat: lips, mucosa, and tongue normal; teeth and gums normal Neck: no adenopathy, no carotid bruit, supple, symmetrical, trachea midline and thyroid not enlarged, symmetric, no tenderness/mass/nodules Back: symmetric, no curvature. ROM normal. No CVA tenderness. Lungs: clear to auscultation bilaterally Heart: regular rate and rhythm, S1, S2 normal, no murmur, click, rub or gallop Abdomen: soft, non-tender; bowel sounds normal; no masses,  no organomegaly Pulses: 2+ and symmetric Skin: Skin color, texture, turgor normal. No rashes  or lesions Lymph nodes: Cervical, supraclavicular, and axillary nodes normal.  Assessment and Plan:  Diarrhea Etiology unclear.  Chronic, accompanied by weight loss .  No leuokocytosis on today's CBC.  Clear liquid diet and Empiric cipro/flagyl x 7 days pending stool studies,  if no improvement will need to consider collagenous colitis with colonoscopy     Updated Medication List Outpatient Encounter Prescriptions as of 03/07/2013  Medication Sig Dispense Refill  . aspirin 81 MG tablet Take 81 mg by mouth daily.        Marland Kitchen co-enzyme Q-10 30 MG capsule Take 30 mg by mouth 3 (three) times daily.      . fluticasone (FLONASE) 50 MCG/ACT nasal spray Place 2 sprays into the nose daily as needed.  16 g  5  . fluticasone (FLONASE) 50 MCG/ACT nasal spray SPRAY TWO  SPRAYS IN EACH NOSTRIL EVERY DAY AS NEEDED  16 g  3  . Glucosamine-Chondroit-Vit C-Mn (GLUCOSAMINE CHONDR 1500 COMPLX PO) Take by mouth 2 (two) times daily.        . hydrochlorothiazide (HYDRODIURIL) 25 MG tablet Take 1 tablet (25 mg total) by mouth daily.  90 tablet  3  . ibuprofen (ADVIL,MOTRIN) 200 MG tablet Take 200 mg by mouth as needed.        Marland Kitchen KRILL OIL PO Take by mouth daily.      Marland Kitchen lisinopril (PRINIVIL,ZESTRIL) 30 MG tablet Take 1 tablet (30 mg total) by mouth daily.  90 tablet  3  . loratadine (CLARITIN) 10 MG tablet Take 10 mg by mouth daily.      . Multiple Vitamin (MULTIVITAMIN) tablet Take 1 tablet by mouth daily.        . sertraline (ZOLOFT) 50 MG tablet       . simvastatin (ZOCOR) 40 MG tablet Take 1 tablet (40 mg total) by mouth at bedtime.  90 tablet  3  . simvastatin (ZOCOR) 40 MG tablet TAKE ONE TABLET BY MOUTH AT BEDTIME  90 tablet  0  . traMADol (ULTRAM) 50 MG tablet Take 50 mg by mouth at bedtime as needed.      . traZODone (DESYREL) 100 MG tablet Take 1 tablet (100 mg total) by mouth at bedtime.  90 tablet  3  . traZODone (DESYREL) 100 MG tablet TAKE ONE TABLET BY MOUTH AT BEDTIME  90 tablet  2  . buPROPion (WELLBUTRIN SR) 150 MG 12 hr tablet Take 1 tablet (150 mg total) by mouth 2 (two) times daily.  60 tablet  3  . ciprofloxacin (CIPRO) 500 MG tablet Take 1 tablet (500 mg total) by mouth 2 (two) times daily.  14 tablet  0  . methocarbamol (ROBAXIN) 750 MG tablet Take 750 mg by mouth at bedtime as needed.      . metroNIDAZOLE (FLAGYL) 500 MG tablet Take 1 tablet (500 mg total) by mouth 3 (three) times daily.  21 tablet  0  . pramipexole (MIRAPEX) 0.25 MG tablet 1 or 2 tablet daily at bedtime for restless legs  60 tablet  3  . [DISCONTINUED] ciprofloxacin (CIPRO) 250 MG tablet Take 1 tablet (250 mg total) by mouth 2 (two) times daily.  10 tablet  0  . [DISCONTINUED] ciprofloxacin (CIPRO) 250 MG tablet Take 1 tablet (250 mg total) by mouth 2 (two) times daily.  10 tablet   0  . [DISCONTINUED] ciprofloxacin (CIPRO) 500 MG tablet Take 1 tablet (500 mg total) by mouth 2 (two) times daily.  14 tablet  0   No  facility-administered encounter medications on file as of 03/07/2013.     Orders Placed This Encounter  Procedures  . Ova and parasite screen  . Stool culture  . Clostridium Difficile by PCR  . MM Digital Screening  . CBC w/Diff  . Magnesium  . Comp Met (CMET)  . HM COLONOSCOPY    No Follow-up on file.

## 2013-03-07 NOTE — Patient Instructions (Addendum)
Please submit your stool for testing prior to startng the 2 antibiotics I sent to your drug store  Clear liquid diet for 3 days may help   Do not drink any alcohol until 24 hours after your last dose of antibiotics

## 2013-03-09 ENCOUNTER — Encounter: Payer: Self-pay | Admitting: Internal Medicine

## 2013-03-09 DIAGNOSIS — Z8619 Personal history of other infectious and parasitic diseases: Secondary | ICD-10-CM | POA: Insufficient documentation

## 2013-03-09 NOTE — Assessment & Plan Note (Addendum)
Etiology unclear.  Chronic, accompanied by weight loss .  No leuokocytosis on today's CBC.  Clear liquid diet and Empiric cipro/flagyl x 7 days pending stool studies,  if no improvement will need to consider collagenous colitis with colonoscopy

## 2013-03-11 LAB — STOOL CULTURE

## 2013-03-11 NOTE — Addendum Note (Signed)
Addended by: Sherlene Shams on: 03/11/2013 08:08 AM   Modules accepted: Orders

## 2013-03-13 ENCOUNTER — Telehealth: Payer: Self-pay | Admitting: Internal Medicine

## 2013-03-13 NOTE — Telephone Encounter (Signed)
States she is returning a call, hoping it is regarding results.

## 2013-03-24 ENCOUNTER — Telehealth: Payer: Self-pay | Admitting: Internal Medicine

## 2013-03-24 DIAGNOSIS — Z1239 Encounter for other screening for malignant neoplasm of breast: Secondary | ICD-10-CM

## 2013-03-24 DIAGNOSIS — R197 Diarrhea, unspecified: Secondary | ICD-10-CM

## 2013-03-24 NOTE — Telephone Encounter (Signed)
Pt asking for slip to have labs drawn for her illness that has returned.  Please contact pt for any questions.  No other information/detail given to front staff.  Fax slip to:  [phone].

## 2013-03-25 ENCOUNTER — Other Ambulatory Visit: Payer: Self-pay | Admitting: Internal Medicine

## 2013-03-25 DIAGNOSIS — R197 Diarrhea, unspecified: Secondary | ICD-10-CM

## 2013-03-25 MED ORDER — ONDANSETRON HCL 4 MG PO TABS: 4.0000 mg | ORAL_TABLET | Freq: Three times a day (TID) | ORAL | Status: DC | PRN

## 2013-03-25 MED ORDER — CIPROFLOXACIN HCL 500 MG PO TABS: 500.0000 mg | ORAL_TABLET | Freq: Two times a day (BID) | ORAL | Status: DC

## 2013-03-25 MED ORDER — METRONIDAZOLE 500 MG PO TABS: 500.0000 mg | ORAL_TABLET | Freq: Three times a day (TID) | ORAL | Status: DC

## 2013-03-25 NOTE — Telephone Encounter (Signed)
Talked with patient clarified symptoms and advised MD of symptoms flagyl and cipro called in . Patient notified.

## 2013-03-25 NOTE — Assessment & Plan Note (Signed)
Recurrent 2nd episode in 3 weeks,  Patient currently in [place].  Will repeat cipro/flagyl and add zofran,  Sent to [facility]

## 2013-03-28 ENCOUNTER — Encounter: Payer: Medicare Other | Admitting: Internal Medicine

## 2013-04-08 ENCOUNTER — Telehealth: Payer: Self-pay | Admitting: Internal Medicine

## 2013-04-08 NOTE — Telephone Encounter (Signed)
Pt wants to inform that she has had a tick bite end of June/first of July and wants to know if that could be causing her problems she is being treated for.  Also states she was to have a colonoscopy set up.  Needs mammogram set up for October, states she did not get one last year.  Has f/u appt scheduled with Dr. Darrick Huntsman 04/30/13.

## 2013-04-09 NOTE — Telephone Encounter (Signed)
1) no the tick bite has nothing to do with the diarrhea 2) GI referral in processs 3)mammogram ordered

## 2013-04-09 NOTE — Telephone Encounter (Signed)
Left message, notifying pt.  

## 2013-04-25 ENCOUNTER — Ambulatory Visit (INDEPENDENT_AMBULATORY_CARE_PROVIDER_SITE_OTHER): Payer: Medicare Other | Admitting: Internal Medicine

## 2013-04-25 ENCOUNTER — Encounter: Payer: Self-pay | Admitting: Internal Medicine

## 2013-04-25 VITALS — BP 126/60 | HR 59 | Temp 98.2°F | Resp 14 | Ht 65.0 in | Wt 172.8 lb

## 2013-04-25 DIAGNOSIS — E785 Hyperlipidemia, unspecified: Secondary | ICD-10-CM

## 2013-04-25 DIAGNOSIS — R197 Diarrhea, unspecified: Secondary | ICD-10-CM

## 2013-04-25 DIAGNOSIS — K529 Noninfective gastroenteritis and colitis, unspecified: Secondary | ICD-10-CM

## 2013-04-25 DIAGNOSIS — G2581 Restless legs syndrome: Secondary | ICD-10-CM

## 2013-04-25 DIAGNOSIS — N179 Acute kidney failure, unspecified: Secondary | ICD-10-CM

## 2013-04-25 DIAGNOSIS — K5289 Other specified noninfective gastroenteritis and colitis: Secondary | ICD-10-CM

## 2013-04-25 LAB — LIPID PANEL
Cholesterol: 121 mg/dL (ref 0–200)
HDL: 33.4 mg/dL — ABNORMAL LOW (ref >39.00)
LDL Cholesterol: 57 mg/dL (ref 0–99)
Triglycerides: 152 mg/dL — ABNORMAL HIGH (ref 0.0–149.0)
VLDL: 30.4 mg/dL (ref 0.0–40.0)

## 2013-04-25 LAB — COMPREHENSIVE METABOLIC PANEL
ALT: 16 U/L (ref 0–35)
AST: 18 U/L (ref 0–37)
Alkaline Phosphatase: 41 U/L (ref 39–117)
CO2: 23 mEq/L (ref 19–32)
Creatinine, Ser: 0.9 mg/dL (ref 0.4–1.2)
Sodium: 138 mEq/L (ref 135–145)
Total Bilirubin: 0.7 mg/dL (ref 0.3–1.2)
Total Protein: 6.9 g/dL (ref 6.0–8.3)

## 2013-04-25 LAB — MAGNESIUM: Magnesium: 1.8 mg/dL (ref 1.5–2.5)

## 2013-04-25 LAB — BASIC METABOLIC PANEL
BUN: 12 mg/dL (ref 6–23)
Calcium: 8.9 mg/dL (ref 8.4–10.5)
Creatinine, Ser: 0.9 mg/dL (ref 0.4–1.2)
GFR: 64.65 mL/min (ref >60.00)
Glucose, Bld: 108 mg/dL — ABNORMAL HIGH (ref 70–99)
Potassium: 3.5 mEq/L (ref 3.5–5.1)

## 2013-04-25 LAB — SEDIMENTATION RATE: Sed Rate: 17 mm/hr (ref 0–22)

## 2013-04-25 NOTE — Patient Instructions (Addendum)
Do not take the Lomotil until you hear from me about the stool test Resume your probiotic immediately Avoid dairy as much as possible,  Try taking Lactase before any meal with dairy

## 2013-04-25 NOTE — Assessment & Plan Note (Addendum)
Recurrent,  With c difficile now positive.  She was contacted by on  Call MD Dr Caryl Never, and oral metronidazole tid was prescribed.  She has an  appt with Dr Mechele Collin at the end of the month.

## 2013-04-25 NOTE — Progress Notes (Signed)
Patient ID: Katherine Hudson, female   DOB: [birth year], 67 y.o.   MRN: [id number]  Patient Active Problem List   Diagnosis Date Noted  . 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 UTI (lower urinary tract infection) 09/09/2012  . Rash of neck 09/09/2012  . Restless legs syndrome 05/02/2012  . Chronic insomnia 03/27/2012  . Loss of libido 03/27/2012  . Screening for cervical cancer 07/24/2011  . Diarrhea 07/24/2011  . Hyperlipidemia   . Hypertension   . Depression     Subjective:  CC:   Chief Complaint  Patient presents with  . Follow-up    cramping, diarrhea. 10 - 12 BM per day, 3rd bout, S/S reyurned 8 days ago.    HPI:   Katherine Hudson a 67 y.o. female who presents Persistent diarrhea for the past 8 days .  First seen in February for one month history of urinary symptoms (dysuria anf frequency) and was treated with antibiotics.  Returned in August with one month history of diarrhea associated with cramping and fevers and was treated with cipro and flagyl after stool studies, including PCR for c dificile  were negative.  Her stool Improved to soft consistency with empiric cipro/flagyl but never competely solidified.   One week ago the diarrhea returned.  Currently she is having more than 6 explosive liquid stools daily for the past week, occurring 1/2 hour post prandially.  She has not eliminated dairy from diet, has cheese and yogurt  daily but no milk.  Does not chew sugar free gum except rarely .  She has had a 14 lb wt loss . She has a history of History of IBS but has not tried immodium or lomotil .  She was taking a probiotic but stopped it after after seeing me in August for unclear reasons.    2) RLS:  Did not have any relief with trial of low dose pramiprexole for restless legs .  Has been using Icy hot on calves and eases the feeling,  Has stopped the NSAIDs on a regular basis    Past Medical History  Diagnosis Date  . Hyperlipidemia   . Hypertension   . Depression      Past Surgical History  Procedure Laterality Date  . Appendectomy    . Knee surgery  2000    right  . Rotator cuff repair  1998    right       The following portions of the patient's history were reviewed and updated as appropriate: Allergies, current medications, and problem list.    Review of Systems:   12 Pt  review of systems was negative except those addressed in the HPI,     History   Social History  . Marital Status: Married    Spouse Name: N/A    Number of Children: N/A  . Years of Education: N/A   Occupational History  . Not on file.   Social History Main Topics  . Smoking status: Former Games developer  . Smokeless tobacco: Never Used     Comment: quit 1980  . Alcohol Use: Yes     Comment: occassionally  . Drug Use: No  . Sexual Activity: Not on file   Other Topics Concern  . Not on file   Social History Narrative  . No narrative on file    Objective:  Filed Vitals:   04/25/13 0904  BP: 126/60  Pulse: 59  Temp: 98.2 F (36.8 C)  Resp: 14     General  appearance: alert, cooperative and appears stated age Ears: normal TM's and external ear canals both ears Throat: lips, mucosa, and tongue normal; teeth and gums normal Neck: no adenopathy, no carotid bruit, supple, symmetrical, trachea midline and thyroid not enlarged, symmetric, no tenderness/mass/nodules Back: symmetric, no curvature. ROM normal. No CVA tenderness. Lungs: clear to auscultation bilaterally Heart: regular rate and rhythm, S1, S2 normal, no murmur, click, rub or gallop Abdomen: soft, non-tender; bowel sounds normal; no masses,  no organomegaly Pulses: 2+ and symmetric Skin: Skin color, texture, turgor normal. No rashes or lesions Lymph nodes: Cervical, supraclavicular, and axillary nodes normal.  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  She was contacted by on  Call MD Dr Caryl Never, and oral metronidazole tid  was prescribed.  She has an  appt with Dr Mechele Collin at the end of the month.   Restless legs syndrome Initial trial of pramiprexole not helpful.  Symptoms improved with use of topical balms    Updated Medication List Outpatient Encounter Prescriptions as of 04/25/2013  Medication Sig Dispense Refill  . aspirin 81 MG tablet Take 81 mg by mouth daily.        . Biotin 1000 MCG tablet Take 1,000 mcg by mouth 3 (three) times daily.      Marland Kitchen co-enzyme Q-10 30 MG capsule Take 30 mg by mouth 3 (three) times daily.      . fluticasone (FLONASE) 50 MCG/ACT nasal spray Place 2 sprays into the nose daily as needed.  16 g  5  . Glucosamine-Chondroit-Vit C-Mn (GLUCOSAMINE CHONDR 1500 COMPLX PO) Take by mouth 2 (two) times daily.        . hydrochlorothiazide (HYDRODIURIL) 25 MG tablet Take 1 tablet (25 mg total) by mouth daily.  90 tablet  3  . ibuprofen (ADVIL,MOTRIN) 200 MG tablet Take 200 mg by mouth as needed.        Marland Kitchen KRILL OIL PO Take by mouth daily.      Marland Kitchen lisinopril (PRINIVIL,ZESTRIL) 30 MG tablet Take 1 tablet (30 mg total) by mouth daily.  90 tablet  3  . loratadine (CLARITIN) 10 MG tablet Take 10 mg by mouth daily.      . methocarbamol (ROBAXIN) 750 MG tablet Take 750 mg by mouth at bedtime as needed.      . Multiple Vitamin (MULTIVITAMIN) tablet Take 1 tablet by mouth daily.        . sertraline (ZOLOFT) 50 MG tablet       . simvastatin (ZOCOR) 40 MG tablet TAKE ONE TABLET BY MOUTH AT BEDTIME  90 tablet  0  . traZODone (DESYREL) 100 MG tablet Take 1 tablet (100 mg total) by mouth at bedtime.  90 tablet  3  . traZODone (DESYREL) 100 MG tablet TAKE ONE TABLET BY MOUTH AT BEDTIME  90 tablet  2  . [DISCONTINUED] fluticasone (FLONASE) 50 MCG/ACT nasal spray SPRAY TWO SPRAYS IN EACH NOSTRIL EVERY DAY AS NEEDED  16 g  3  . buPROPion (WELLBUTRIN SR) 150 MG 12 hr tablet Take 1 tablet (150 mg total) by mouth 2 (two) times daily.  60 tablet  3  . [DISCONTINUED] ciprofloxacin (CIPRO) 500 MG tablet Take 1 tablet  (500 mg total) by mouth 2 (two) times daily.  14 tablet  0  . [DISCONTINUED] metroNIDAZOLE (FLAGYL) 500 MG tablet Take 1 tablet (500 mg total) by mouth 3 (three) times daily.  21 tablet  0  . [DISCONTINUED] ondansetron (ZOFRAN)  4 MG tablet Take 1 tablet (4 mg total) by mouth every 8 (eight) hours as needed for nausea.  20 tablet  0  . [DISCONTINUED] pramipexole (MIRAPEX) 0.25 MG tablet 1 or 2 tablet daily at bedtime for restless legs  60 tablet  3  . [DISCONTINUED] simvastatin (ZOCOR) 40 MG tablet Take 1 tablet (40 mg total) by mouth at bedtime.  90 tablet  3  . [DISCONTINUED] traMADol (ULTRAM) 50 MG tablet Take 50 mg by mouth at bedtime as needed.       No facility-administered encounter medications on file as of 04/25/2013.     Orders Placed This Encounter  Procedures  . Clostridium difficile EIA  . HM PAP SMEAR  . C-reactive protein  . Sedimentation rate  . Magnesium  . Comprehensive metabolic panel  . Lipid panel    No Follow-up on file.

## 2013-04-26 ENCOUNTER — Other Ambulatory Visit: Payer: Self-pay

## 2013-04-26 ENCOUNTER — Telehealth: Payer: Self-pay

## 2013-04-26 LAB — CLOSTRIDIUM DIFFICILE EIA

## 2013-04-26 MED ORDER — METRONIDAZOLE 500 MG PO TABS: 500.0000 mg | ORAL_TABLET | Freq: Three times a day (TID) | ORAL | Status: DC

## 2013-04-26 NOTE — Telephone Encounter (Signed)
Per Dr. Lucie Leather request called in Flagyl 500 mg tid x 14 days to [facility] on [address].  Called and spoke with pt about medication and she states the only allergy she has is to penicillin and she had never taken flagyl before.  Advised to discuss possible side effects with the pharmacist about the medication.  Explained to pt that c-diff was an inflammation of the colon that was causing her watery stools, abdominal pain and tenderness. Pt would like to know more about this. Pt aware rx sent to pharmacy and to start medication.

## 2013-04-26 NOTE — Telephone Encounter (Signed)
Called regarding positive C dif.  Will start Flagyl 500 mg po TID and recommend follow up with primary to reassess within 2 weeks.

## 2013-04-26 NOTE — Assessment & Plan Note (Signed)
Initial trial of pramiprexole not helpful.  Symptoms improved with use of topical balms

## 2013-04-26 NOTE — Telephone Encounter (Signed)
Thanks For handling that

## 2013-04-28 ENCOUNTER — Encounter: Payer: Self-pay | Admitting: *Deleted

## 2013-04-30 ENCOUNTER — Ambulatory Visit: Payer: Medicare Other | Admitting: Internal Medicine

## 2013-05-03 ENCOUNTER — Other Ambulatory Visit: Payer: Self-pay | Admitting: Internal Medicine

## 2013-05-05 ENCOUNTER — Other Ambulatory Visit: Payer: Self-pay | Admitting: *Deleted

## 2013-05-05 DIAGNOSIS — I10 Essential (primary) hypertension: Secondary | ICD-10-CM

## 2013-05-05 MED ORDER — SERTRALINE HCL 50 MG PO TABS: 50.0000 mg | ORAL_TABLET | Freq: Every day | ORAL | Status: DC

## 2013-05-05 MED ORDER — LISINOPRIL 30 MG PO TABS: 30.0000 mg | ORAL_TABLET | Freq: Every day | ORAL | Status: DC

## 2013-05-13 ENCOUNTER — Telehealth: Payer: Self-pay | Admitting: *Deleted

## 2013-05-13 NOTE — Telephone Encounter (Signed)
Pharmacy Note:  Sertraline 50 mg   Pt would like a 90 day supply

## 2013-05-18 LAB — HM MAMMOGRAPHY: HM MAMMO: NORMAL

## 2013-05-21 ENCOUNTER — Other Ambulatory Visit: Payer: Self-pay | Admitting: Gastroenterology

## 2013-05-22 ENCOUNTER — Ambulatory Visit: Payer: Self-pay | Admitting: Internal Medicine

## 2013-05-23 LAB — STOOL CULTURE

## 2013-05-25 ENCOUNTER — Telehealth: Payer: Self-pay | Admitting: Internal Medicine

## 2013-05-25 ENCOUNTER — Other Ambulatory Visit: Payer: Self-pay | Admitting: Internal Medicine

## 2013-05-25 DIAGNOSIS — R928 Other abnormal and inconclusive findings on diagnostic imaging of breast: Secondary | ICD-10-CM

## 2013-05-25 NOTE — Telephone Encounter (Signed)
[facility] has reported that Her mammogram was abnormal on the left .   She will be contacted to get additional films and an ultrasound the facility. If she has not heard from them yet, let us know

## 2013-05-26 NOTE — Telephone Encounter (Signed)
Per Marchelle Folks, the patient was just seen on 05/22/13 for mammogram. It is taken about 2 weeks to get the patient back in for added views, they schedule themselves for added views

## 2013-05-26 NOTE — Telephone Encounter (Signed)
Printed,  Give to [employer]

## 2013-05-26 NOTE — Telephone Encounter (Signed)
Forwarded to [facility] as requested.

## 2013-05-26 NOTE — Telephone Encounter (Signed)
Patient reported she has not heard from [facility], please advise.

## 2013-06-02 ENCOUNTER — Ambulatory Visit (INDEPENDENT_AMBULATORY_CARE_PROVIDER_SITE_OTHER): Payer: Medicare Other | Admitting: Internal Medicine

## 2013-06-02 ENCOUNTER — Encounter: Payer: Self-pay | Admitting: Internal Medicine

## 2013-06-02 VITALS — BP 120/64 | HR 64 | Temp 98.6°F | Resp 12 | Ht 65.6 in | Wt 166.5 lb

## 2013-06-02 DIAGNOSIS — Z23 Encounter for immunization: Secondary | ICD-10-CM

## 2013-06-02 DIAGNOSIS — F329 Major depressive disorder, single episode, unspecified: Secondary | ICD-10-CM

## 2013-06-02 DIAGNOSIS — I10 Essential (primary) hypertension: Secondary | ICD-10-CM

## 2013-06-02 DIAGNOSIS — Z0000 Encounter for general adult medical examination without abnormal findings: Secondary | ICD-10-CM

## 2013-06-02 DIAGNOSIS — A0472 Enterocolitis due to Clostridium difficile, not specified as recurrent: Secondary | ICD-10-CM

## 2013-06-02 DIAGNOSIS — M8580 Other specified disorders of bone density and structure, unspecified site: Secondary | ICD-10-CM

## 2013-06-02 DIAGNOSIS — E785 Hyperlipidemia, unspecified: Secondary | ICD-10-CM

## 2013-06-02 DIAGNOSIS — M899 Disorder of bone, unspecified: Secondary | ICD-10-CM

## 2013-06-02 LAB — COMPREHENSIVE METABOLIC PANEL
ALT: 33 U/L (ref 0–35)
Alkaline Phosphatase: 42 U/L (ref 39–117)
CO2: 28 mEq/L (ref 19–32)
Creatinine, Ser: 0.9 mg/dL (ref 0.4–1.2)
GFR: 69.86 mL/min (ref >60.00)
Glucose, Bld: 96 mg/dL (ref 70–99)
Total Bilirubin: 0.8 mg/dL (ref 0.3–1.2)

## 2013-06-02 LAB — MAGNESIUM: Magnesium: 2.1 mg/dL (ref 1.5–2.5)

## 2013-06-02 MED ORDER — ZOSTER VACCINE LIVE 19400 UNT/0.65ML ~~LOC~~ SOLR: 0.6500 mL | Freq: Once | SUBCUTANEOUS | Status: DC

## 2013-06-02 MED ORDER — TETANUS-DIPHTH-ACELL PERTUSSIS 5-2.5-18.5 LF-MCG/0.5 IM SUSP: 0.5000 mL | Freq: Once | INTRAMUSCULAR | Status: DC

## 2013-06-02 NOTE — Progress Notes (Signed)
Patient ID: Katherine Hudson, female   DOB: [birth year], 67 y.o.   MRN: [id number]  The patient is here for annual Medicare wellness examination and management of other chronic and acute problems.   She was diagnosed with c dificile enteritis 4 weeks ago  repeat testing of stools due to persistent diarrhea and weight loss was positive.  She was referred to GI who changed her antibiotic regimen from Flagyl to vancomycin for persistent symptoms after 10 days. She thinks that she still has the infection because she continues to have more than one stool daily. However her stools are partially formed and no longer watery. He has however lost another 6 pounds. She has been seen 3 times by the nurse practitioner at Dr. Philmore Pali office. Her Last office visit was oct 21st  No repeat stool test done ,  colonoscopy is scheduled for nov 21st .  She is asking for me to petition her insurance to pay for fecal transplant.   2) abnormal mammogram. She was contacted by our office over a week ago but has not heard from the hospital yet for additional studies of the left breast.      The risk factors are reflected in the social history.  The roster of all physicians providing medical care to patient - is listed in the Snapshot section of the chart.  Activities of daily living:  The patient is 100% independent in all ADLs: dressing, toileting, feeding as well as independent mobility  Home safety : The patient has smoke detectors in the home. They wear seatbelts.  There are no firearms at home. There is no violence in the home.   There is no risks for hepatitis, STDs or HIV. There is no   history of blood transfusion. They have no travel history to infectious disease endemic areas of the world.  The patient has seen their dentist in the last six month. They have seen their eye doctor in the last year. They admit to slight hearing difficulty with regard to whispered voices and some television programs.  They have deferred  audiologic testing in the last year.  They do not  have excessive sun exposure. Discussed the need for sun protection: hats, long sleeves and use of sunscreen if there is significant sun exposure.   Diet: the importance of a healthy diet is discussed. They do have a healthy diet.  The benefits of regular aerobic exercise were discussed. She walks 4 times per week ,  20 minutes.   Depression screen: there are no signs or vegative symptoms of depression- irritability, change in appetite, anhedonia, sadness/tearfullness.  Cognitive assessment: the patient manages all their financial and personal affairs and is actively engaged. They could relate day,date,year and events; recalled 2/3 objects at 3 minutes; performed clock-face test normally.  The following portions of the patient's history were reviewed and updated as appropriate: allergies, current medications, past family history, past medical history,  past surgical history, past social history  and problem list.  Visual acuity was not assessed per patient preference since she has regular follow up with her ophthalmologist. Hearing and body mass index were assessed and reviewed.   During the course of the visit the patient was educated and counseled about appropriate screening and preventive services including : fall prevention , diabetes screening, nutrition counseling, colorectal cancer screening, and recommended immunizations.     Objective:  BP 120/64  Pulse 64  Temp(Src) 98.6 F (37 C) (Oral)  Resp 12  Ht 5' 5.6" (1.666  m)  Wt 166 lb 8 oz (75.524 kg)  BMI 27.21 kg/m2  SpO2 96%  General Appearance:    Alert, cooperative, no distress, appears stated age  Head:    Normocephalic, without obvious abnormality, atraumatic  Eyes:    PERRL, conjunctiva/corneas clear, EOM's intact, fundi    benign, both eyes  Ears:    Normal TM's and external ear canals, both ears  Nose:   Nares normal, septum midline, mucosa normal, no drainage    or  sinus tenderness  Throat:   Lips, mucosa, and tongue normal; teeth and gums normal  Neck:   Supple, symmetrical, trachea midline, no adenopathy;    thyroid:  no enlargement/tenderness/nodules; no carotid   bruit or JVD  Back:     Symmetric, no curvature, ROM normal, no CVA tenderness  Lungs:     Clear to auscultation bilaterally, respirations unlabored  Chest Wall:    No tenderness or deformity   Heart:    Regular rate and rhythm, S1 and S2 normal, no murmur, rub   or gallop  Breast Exam:    No tenderness, masses, or nipple abnormality  Abdomen:     Soft, non-tender, bowel sounds active all four quadrants,    no masses, no organomegaly        Extremities:   Extremities normal, atraumatic, no cyanosis or edema  Pulses:   2+ and symmetric all extremities  Skin:   Skin color, texture, turgor normal, no rashes or lesions  Lymph nodes:   Cervical, supraclavicular, and axillary nodes normal  Neurologic:   CNII-XII intact, normal strength, sensation and reflexes    throughout   Assessment and Plan:  Enteritis due to Clostridium difficile C dificile confirmed on  Week 3 of treatment,, week 2 of vanocmycin .Marland Kitchen Stools are semi solid. colonoscopy planned for nov 21st . resubmitted c dificile stool test today   Depression Symptoms are stable on current therapy. No changes today.  Hypertension Well controlled on current regimen. Renal function stable, no changes today.  Hyperlipidemia She is taking Zocor for reduction in coronary risk. LFTs to be checked today.  Lab Results  Component Value Date   CHOL 121 04/25/2013   HDL 33.40* 04/25/2013   LDLCALC 57 04/25/2013   TRIG 152.0* 04/25/2013   CHOLHDL 4 04/25/2013    Routine general medical examination at a health care facility Annual comprehensive exam was done including breast, excluding pelvic and PAP smear. All screenings have been addressed .    Updated Medication List Outpatient Encounter Prescriptions as of 06/02/2013  Medication  Sig Dispense Refill  . aspirin 81 MG tablet Take 81 mg by mouth daily.        . Biotin 1000 MCG tablet Take 1,000 mcg by mouth 3 (three) times daily.      Marland Kitchen co-enzyme Q-10 30 MG capsule Take 30 mg by mouth 3 (three) times daily.      . fluticasone (FLONASE) 50 MCG/ACT nasal spray Place 2 sprays into the nose daily as needed.  16 g  5  . Glucosamine-Chondroit-Vit C-Mn (GLUCOSAMINE CHONDR 1500 COMPLX PO) Take by mouth 2 (two) times daily.        . hydrochlorothiazide (HYDRODIURIL) 25 MG tablet Take 1 tablet (25 mg total) by mouth daily.  90 tablet  3  . ibuprofen (ADVIL,MOTRIN) 200 MG tablet Take 200 mg by mouth as needed.        Marland Kitchen KRILL OIL PO Take by mouth daily.      Marland Kitchen lisinopril (  PRINIVIL,ZESTRIL) 30 MG tablet Take 1 tablet (30 mg total) by mouth daily.  90 tablet  1  . loratadine (CLARITIN) 10 MG tablet Take 10 mg by mouth daily.      . methocarbamol (ROBAXIN) 750 MG tablet Take 750 mg by mouth at bedtime as needed.      . Multiple Vitamin (MULTIVITAMIN) tablet Take 1 tablet by mouth daily.        . sertraline (ZOLOFT) 50 MG tablet Take 1 tablet (50 mg total) by mouth daily.  30 tablet  2  . simvastatin (ZOCOR) 40 MG tablet TAKE ONE TABLET BY MOUTH AT BEDTIME  90 tablet  1  . vancomycin (VANCOCIN) 250 MG capsule Take 1 capsule by mouth 3 (three) times daily.      . TDaP (BOOSTRIX) 5-2.5-18.5 LF-MCG/0.5 injection Inject 0.5 mLs into the muscle once.  0.5 mL  0  . zoster vaccine live, PF, (ZOSTAVAX) 14782 UNT/0.65ML injection Inject 19,400 Units into the skin once.  1 each  0  . [DISCONTINUED] buPROPion (WELLBUTRIN SR) 150 MG 12 hr tablet Take 1 tablet (150 mg total) by mouth 2 (two) times daily.  60 tablet  3  . [DISCONTINUED] metroNIDAZOLE (FLAGYL) 500 MG tablet Take 1 tablet (500 mg total) by mouth 3 (three) times daily.  42 tablet  0  . [DISCONTINUED] traZODone (DESYREL) 100 MG tablet Take 1 tablet (100 mg total) by mouth at bedtime.  90 tablet  3  . [DISCONTINUED] traZODone (DESYREL) 100  MG tablet TAKE ONE TABLET BY MOUTH AT BEDTIME  90 tablet  2   No facility-administered encounter medications on file as of 06/02/2013.

## 2013-06-02 NOTE — Patient Instructions (Signed)
You had your annual Medicare wellness exam today  You need to have a TDaP vaccine and a Shingles vaccine.  I have given you prescriptions for these because they will be cheaper at the health Dept or at your  local pharmacy because Medicare will not reimburse for them.   You received the pneumonia and the influenza vaccines today.  We will contact you with the bloodwork results

## 2013-06-02 NOTE — Assessment & Plan Note (Addendum)
C dificile confirmed on  Week 3 of treatment,, week 2 of vanocmycin .Marland Kitchen Stools are semi solid. colonoscopy planned for nov 21st . resubmitted c dificile stool test today

## 2013-06-03 ENCOUNTER — Ambulatory Visit: Payer: Self-pay | Admitting: Internal Medicine

## 2013-06-03 DIAGNOSIS — Z0000 Encounter for general adult medical examination without abnormal findings: Secondary | ICD-10-CM | POA: Insufficient documentation

## 2013-06-03 NOTE — Assessment & Plan Note (Signed)
Annual comprehensive exam was done including breast, excluding pelvic and PAP smear. All screenings have been addressed .  

## 2013-06-03 NOTE — Assessment & Plan Note (Signed)
Well controlled on current regimen. Renal function stable, no changes today. 

## 2013-06-03 NOTE — Assessment & Plan Note (Signed)
She is taking Zocor for reduction in coronary risk. LFTs to be checked today.  Lab Results  Component Value Date   CHOL 121 04/25/2013   HDL 33.40* 04/25/2013   LDLCALC 57 04/25/2013   TRIG 152.0* 04/25/2013   CHOLHDL 4 04/25/2013

## 2013-06-03 NOTE — Assessment & Plan Note (Signed)
Symptoms are stable on current therapy. No changes today.   

## 2013-06-09 ENCOUNTER — Telehealth: Payer: Self-pay | Admitting: Internal Medicine

## 2013-06-09 LAB — STOOL CULTURE

## 2013-06-09 LAB — C DIFFICILE, CYTOTOXIN B

## 2013-06-09 LAB — C DIFFICILE TOXINS A+B W/RFLX: C difficile Toxins A+B, EIA: NEGATIVE

## 2013-06-09 NOTE — Telephone Encounter (Signed)
The patient states that her Zoloft 50 mg was called into the pharmacy and on the instructions it was for her to take 1 tablet once a day ,but she takes 3 tablets once a day.   She also wants her lab results.

## 2013-06-10 ENCOUNTER — Encounter: Payer: Self-pay | Admitting: Internal Medicine

## 2013-06-11 MED ORDER — SERTRALINE HCL 50 MG PO TABS: 150.0000 mg | ORAL_TABLET | Freq: Every day | ORAL | Status: DC

## 2013-06-11 NOTE — Telephone Encounter (Signed)
Please apologize for the delay in response (her labs were resulted and sent in a message to [place] on  Nov 3rd. See note;  All stool cultures were negative for c dificile.   rx for #3 zoloft daily sent to pharmacy

## 2013-06-11 NOTE — Telephone Encounter (Signed)
Pt notified of results and Rx sent to pharmacy  

## 2013-06-11 NOTE — Telephone Encounter (Signed)
Ok to send with new directions?

## 2013-06-23 ENCOUNTER — Encounter: Payer: Self-pay | Admitting: Internal Medicine

## 2013-06-25 ENCOUNTER — Ambulatory Visit: Payer: Self-pay | Admitting: Internal Medicine

## 2013-06-25 LAB — HM DEXA SCAN

## 2013-06-30 ENCOUNTER — Ambulatory Visit: Payer: Self-pay | Admitting: Gastroenterology

## 2013-07-01 LAB — CLOSTRIDIUM DIFFICILE(ARMC)

## 2013-07-02 ENCOUNTER — Telehealth: Payer: Self-pay | Admitting: Internal Medicine

## 2013-07-02 DIAGNOSIS — M8580 Other specified disorders of bone density and structure, unspecified site: Secondary | ICD-10-CM

## 2013-07-02 NOTE — Assessment & Plan Note (Signed)
Bone Density scores received, she has osteopenia,  Mild.  Would repeat in 2 years and consider therapy then if there is a significant change. Continue calcium, vitamin d and weight bearing exercise on a regular basis.  

## 2013-07-02 NOTE — Telephone Encounter (Signed)
Bone Density scores received, she has osteopenia,  Mild.  Would repeat in 2 years and consider therapy then if there is a significant change. Continue calcium, vitamin d and weight bearing exercise on a regular basis.  

## 2013-07-02 NOTE — Telephone Encounter (Signed)
Patient notified

## 2013-07-03 LAB — STOOL CULTURE

## 2013-07-18 ENCOUNTER — Ambulatory Visit: Payer: Self-pay | Admitting: Unknown Physician Specialty

## 2013-07-23 ENCOUNTER — Encounter: Payer: Self-pay | Admitting: Internal Medicine

## 2013-08-22 ENCOUNTER — Encounter: Payer: Self-pay | Admitting: Internal Medicine

## 2013-10-22 ENCOUNTER — Telehealth: Payer: Self-pay | Admitting: *Deleted

## 2013-10-22 NOTE — Telephone Encounter (Signed)
Ok to refill sertralinea for 90 days and send to pharmacy in [place]

## 2013-10-22 NOTE — Telephone Encounter (Signed)
Patient would like refill on sertraline please advise ok to fill, patient still in [place], pharmacy is in [place].

## 2013-10-23 MED ORDER — SERTRALINE HCL 50 MG PO TABS: 150.0000 mg | ORAL_TABLET | Freq: Every day | ORAL | Status: DC

## 2013-10-23 NOTE — Telephone Encounter (Signed)
Script sent as ordered. To Pharmacy

## 2013-10-23 NOTE — Telephone Encounter (Signed)
Rx sent to pharmacy by escript  

## 2013-10-23 NOTE — Telephone Encounter (Signed)
Pt is not in [place] any longer would like the rx sent [facility] [address]  [place]  [phone]  50mg  3 times daily for 90 days  Loose tablets sertraline    Pt is on [place] way to [place] now Pt is completely out of meds  Ms [place] phone number is  [phone]

## 2013-10-29 ENCOUNTER — Other Ambulatory Visit: Payer: Self-pay | Admitting: *Deleted

## 2013-10-29 DIAGNOSIS — I10 Essential (primary) hypertension: Secondary | ICD-10-CM

## 2013-10-31 MED ORDER — LISINOPRIL 30 MG PO TABS: 30.0000 mg | ORAL_TABLET | Freq: Every day | ORAL | Status: DC

## 2013-10-31 MED ORDER — SIMVASTATIN 40 MG PO TABS: ORAL_TABLET | ORAL | Status: DC

## 2013-12-05 ENCOUNTER — Other Ambulatory Visit: Payer: Self-pay | Admitting: Internal Medicine

## 2014-01-02 ENCOUNTER — Ambulatory Visit (INDEPENDENT_AMBULATORY_CARE_PROVIDER_SITE_OTHER): Payer: Medicare Other | Admitting: Internal Medicine

## 2014-01-02 ENCOUNTER — Encounter: Payer: Self-pay | Admitting: Internal Medicine

## 2014-01-02 VITALS — BP 140/80 | HR 58 | Temp 98.5°F | Resp 16 | Ht 65.0 in | Wt 173.8 lb

## 2014-01-02 DIAGNOSIS — N390 Urinary tract infection, site not specified: Secondary | ICD-10-CM

## 2014-01-02 DIAGNOSIS — R197 Diarrhea, unspecified: Secondary | ICD-10-CM

## 2014-01-02 DIAGNOSIS — R300 Dysuria: Secondary | ICD-10-CM

## 2014-01-02 LAB — POCT URINALYSIS DIPSTICK
Bilirubin, UA: NEGATIVE
Glucose, UA: NEGATIVE
Ketones, UA: NEGATIVE
NITRITE UA: NEGATIVE
PH UA: 5.5
PROTEIN UA: NEGATIVE
Spec Grav, UA: 1.025
UROBILINOGEN UA: 0.2

## 2014-01-02 MED ORDER — SULFAMETHOXAZOLE-TRIMETHOPRIM 800-160 MG PO TABS: 1.0000 | ORAL_TABLET | Freq: Two times a day (BID) | ORAL | Status: DC

## 2014-01-02 MED ORDER — METRONIDAZOLE 500 MG PO TABS: 500.0000 mg | ORAL_TABLET | Freq: Four times a day (QID) | ORAL | Status: DC

## 2014-01-02 NOTE — Progress Notes (Signed)
Pre-visit discussion using our clinic review tool. No additional management support is needed unless otherwise documented below in the visit note.  

## 2014-01-02 NOTE — Progress Notes (Signed)
Patient ID: Katherine Hudson, female   DOB: [birth year], 68 y.o.   MRN: [id number]  Patient Active Problem List   Diagnosis Date Noted  . Osteopenia 07/02/2013  . Routine general medical examination at a health care facility 06/03/2013  . 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 Restless legs syndrome 05/02/2012  . Chronic insomnia 03/27/2012  . Loss of libido 03/27/2012  . Screening for cervical cancer 07/24/2011  . Hyperlipidemia   . Hypertension   . Depression     Subjective:  CC:   Chief Complaint  Patient presents with  . Urinary Tract Infection    HPI:   Katherine Hudson is a 68 y.o. female who presents for Symptoms of UTi lasting approximately one week. Was tending her mother's funeral and could be treated earlier. History of c dificile colitis  Which occurred last time after treatment for UTi.    Past Medical History  Diagnosis Date  . Hyperlipidemia   . Hypertension   . Depression     Past Surgical History  Procedure Laterality Date  . Appendectomy    . Knee surgery  2000    right  . Rotator cuff repair  1998    right       The following portions of the patient's history were reviewed and updated as appropriate: Allergies, current medications, and problem list.    Review of Systems:   Patient denies headache, fevers, malaise, unintentional weight loss, skin rash, eye pain, sinus congestion and sinus pain, sore throat, dysphagia,  hemoptysis , cough, dyspnea, wheezing, chest pain, palpitations, orthopnea, edema, abdominal pain, nausea, melena, diarrhea, constipation, flank pain, dysuria, hematuria, urinary  Frequency, nocturia, numbness, tingling, seizures,  Focal weakness, Loss of consciousness,  Tremor, insomnia, depression, anxiety, and suicidal ideation.     History   Social History  . Marital Status: Married    Spouse Name: N/A    Number of Children: N/A  . Years of Education: N/A   Occupational History  . Not on file.   Social History Main  Topics  . Smoking status: Former Research scientist (life sciences)  . Smokeless tobacco: Never Used     Comment: quit 1980  . Alcohol Use: Yes     Comment: occassionally  . Drug Use: No  . Sexual Activity: Not on file   Other Topics Concern  . Not on file   Social History Narrative  . No narrative on file    Objective:  Filed Vitals:   01/02/14 1514  BP: 140/80  Pulse: 58  Temp: 98.5 F (36.9 C)  Resp: 16     General appearance: alert, cooperative and appears stated age Ears: normal TM's and external ear canals both ears Throat: lips, mucosa, and tongue normal; teeth and gums normal Neck: no adenopathy, no carotid bruit, supple, symmetrical, trachea midline and thyroid not enlarged, symmetric, no tenderness/mass/nodules Back: symmetric, no curvature. ROM normal. No CVA tenderness. Lungs: clear to auscultation bilaterally Heart: regular rate and rhythm, S1, S2 normal, no murmur, click, rub or gallop Abdomen: soft, non-tender; bowel sounds normal; no masses,  no organomegaly Pulses: 2+ and symmetric Skin: Skin color, texture, turgor normal. No rashes or lesions Lymph nodes: Cervical, supraclavicular, and axillary nodes normal.  Assessment and Plan:  UTI (lower urinary tract infection) Empiric treatment accompanied by flagyl.   Diarrhea Prophylactic treatment with flagyl during treatment for UTI   Updated Medication List Outpatient Encounter Prescriptions as of 01/02/2014  Medication Sig  . aspirin 81 MG tablet Take 81 mg by  mouth daily.    . Biotin 1000 MCG tablet Take 1,000 mcg by mouth 3 (three) times daily.  Marland Kitchen co-enzyme Q-10 30 MG capsule Take 30 mg by mouth 3 (three) times daily.  . fluticasone (FLONASE) 50 MCG/ACT nasal spray Place 2 sprays into the nose daily as needed.  . Glucosamine-Chondroit-Vit C-Mn (GLUCOSAMINE CHONDR 1500 COMPLX PO) Take by mouth 2 (two) times daily.    . hydrochlorothiazide (HYDRODIURIL) 25 MG tablet Take 1 tablet (25 mg total) by mouth daily.  Marland Kitchen ibuprofen  (ADVIL,MOTRIN) 200 MG tablet Take 200 mg by mouth as needed.    Marland Kitchen KRILL OIL PO Take by mouth daily.  Marland Kitchen lisinopril (PRINIVIL,ZESTRIL) 30 MG tablet Take 1 tablet (30 mg total) by mouth daily.  Marland Kitchen loratadine (CLARITIN) 10 MG tablet Take 10 mg by mouth daily.  . methocarbamol (ROBAXIN) 750 MG tablet Take 750 mg by mouth at bedtime as needed.  . Multiple Vitamin (MULTIVITAMIN) tablet Take 1 tablet by mouth daily.    . sertraline (ZOLOFT) 50 MG tablet Take 3 tablets (150 mg total) by mouth daily.  . simvastatin (ZOCOR) 40 MG tablet TAKE ONE TABLET BY MOUTH AT BEDTIME  . vancomycin (VANCOCIN) 250 MG capsule Take 1 capsule by mouth 3 (three) times daily.  . metroNIDAZOLE (FLAGYL) 500 MG tablet Take 1 tablet (500 mg total) by mouth 4 (four) times daily.  Marland Kitchen sulfamethoxazole-trimethoprim (SEPTRA DS) 800-160 MG per tablet Take 1 tablet by mouth 2 (two) times daily.  . TDaP (BOOSTRIX) 5-2.5-18.5 LF-MCG/0.5 injection Inject 0.5 mLs into the muscle once.  . zoster vaccine live, PF, (ZOSTAVAX) 33825 UNT/0.65ML injection Inject 19,400 Units into the skin once.     Orders Placed This Encounter  Procedures  . Urine culture  . POCT Urinalysis Dipstick    No Follow-up on file.

## 2014-01-02 NOTE — Patient Instructions (Signed)
Increase the probiotic to twice daily  Septra DS one tablet twice daily for 3 days   Metronidazole 500 mg 4 times daily for 7 weeks (hopefully to prevent another episode of C dificile )  Start taking cranberry tablets daily to prevent UTIs

## 2014-01-04 ENCOUNTER — Encounter: Payer: Self-pay | Admitting: Internal Medicine

## 2014-01-04 NOTE — Assessment & Plan Note (Signed)
Empiric treatment accompanied by flagyl.

## 2014-01-04 NOTE — Assessment & Plan Note (Signed)
Prophylactic treatment with flagyl during treatment for UTI

## 2014-01-05 LAB — URINE CULTURE: Colony Count: >=100000

## 2014-01-09 ENCOUNTER — Telehealth: Payer: Self-pay | Admitting: Internal Medicine

## 2014-01-09 NOTE — Telephone Encounter (Signed)
Patient  needs a 3 month refill on sertraline. Patient stated that she need more meds for a UTI that she was treated for. Please call pt when rx is ready/msn

## 2014-01-09 NOTE — Telephone Encounter (Signed)
Fine to refill Sertraline. Needs to be seen again if persistent UTI Symptoms.

## 2014-01-09 NOTE — Telephone Encounter (Signed)
Left message for patient to return call to office. Tried verify S/S patient still having but no answer, patient was given septra DS for UTi on 01/02/14 and may I refill Sertraline please advise?

## 2014-01-12 MED ORDER — SERTRALINE HCL 50 MG PO TABS: 150.0000 mg | ORAL_TABLET | Freq: Every day | ORAL | Status: DC

## 2014-01-12 NOTE — Telephone Encounter (Signed)
Pt in office, states she did not hear back about UTI symptoms she continues to have.  Also went to pharmacy to pick up script for sertraline but it had not been called in.  Advised pt she needs to be seen for continued UTI symptoms.  Please call in sertraline for pt.

## 2014-01-12 NOTE — Telephone Encounter (Signed)
Appt made 01/13/14

## 2014-01-13 ENCOUNTER — Ambulatory Visit (INDEPENDENT_AMBULATORY_CARE_PROVIDER_SITE_OTHER): Payer: Medicare Other | Admitting: Adult Health

## 2014-01-13 ENCOUNTER — Encounter: Payer: Self-pay | Admitting: Adult Health

## 2014-01-13 VITALS — BP 130/74 | HR 57 | Temp 97.9°F | Resp 14 | Wt 172.5 lb

## 2014-01-13 DIAGNOSIS — N390 Urinary tract infection, site not specified: Secondary | ICD-10-CM

## 2014-01-13 LAB — POCT URINALYSIS DIPSTICK
Bilirubin, UA: NEGATIVE
Blood, UA: NEGATIVE
Glucose, UA: NEGATIVE
Nitrite, UA: NEGATIVE
Protein, UA: NEGATIVE
Spec Grav, UA: >=1.03
Urobilinogen, UA: 0.2
pH, UA: 5.5

## 2014-01-13 MED ORDER — CIPROFLOXACIN HCL 250 MG PO TABS: 250.0000 mg | ORAL_TABLET | Freq: Two times a day (BID) | ORAL | Status: DC

## 2014-01-13 NOTE — Progress Notes (Signed)
Patient ID: Katherine Hudson, female   DOB: 1946-05-19, 68 y.o.   MRN: [id number]    Subjective:    Patient ID: Katherine Hudson, female    DOB: 11/10/1945, 68 y.o.   MRN: [id number]  HPI  Pt is a pleasant 68 year old female who presents to clinic with ongoing urinary tract infection symptoms. She was recently seen in clinic and treated with Septra twice a day x3 days. She called reporting that her symptoms have not subsided completely. Urinary culture showed Escherichia coli which was sensitive to Septra as well as Cipro and other antibiotics. She has provided a urine sample in clinic today. No fever or chills.   Past Medical History  Diagnosis Date  . Hyperlipidemia   . Hypertension   . Depression     Current Outpatient Prescriptions on File Prior to Visit  Medication Sig Dispense Refill  . aspirin 81 MG tablet Take 81 mg by mouth daily.        . Biotin 1000 MCG tablet Take 1,000 mcg by mouth 3 (three) times daily.      Marland Kitchen co-enzyme Q-10 30 MG capsule Take 30 mg by mouth 3 (three) times daily.      . fluticasone (FLONASE) 50 MCG/ACT nasal spray Place 2 sprays into the nose daily as needed.  16 g  5  . Glucosamine-Chondroit-Vit C-Mn (GLUCOSAMINE CHONDR 1500 COMPLX PO) Take by mouth 2 (two) times daily.        . hydrochlorothiazide (HYDRODIURIL) 25 MG tablet Take 1 tablet (25 mg total) by mouth daily.  90 tablet  3  . ibuprofen (ADVIL,MOTRIN) 200 MG tablet Take 200 mg by mouth as needed.        Marland Kitchen KRILL OIL PO Take by mouth daily.      Marland Kitchen lisinopril (PRINIVIL,ZESTRIL) 30 MG tablet Take 1 tablet (30 mg total) by mouth daily.  90 tablet  1  . loratadine (CLARITIN) 10 MG tablet Take 10 mg by mouth daily.      . methocarbamol (ROBAXIN) 750 MG tablet Take 750 mg by mouth at bedtime as needed.      . Multiple Vitamin (MULTIVITAMIN) tablet Take 1 tablet by mouth daily.        . sertraline (ZOLOFT) 50 MG tablet Take 3 tablets (150 mg total) by mouth daily.  90 tablet  0  . simvastatin (ZOCOR) 40 MG tablet TAKE  ONE TABLET BY MOUTH AT BEDTIME  90 tablet  1  . sulfamethoxazole-trimethoprim (SEPTRA DS) 800-160 MG per tablet Take 1 tablet by mouth 2 (two) times daily.  6 tablet  0  . TDaP (BOOSTRIX) 5-2.5-18.5 LF-MCG/0.5 injection Inject 0.5 mLs into the muscle once.  0.5 mL  0  . zoster vaccine live, PF, (ZOSTAVAX) 97026 UNT/0.65ML injection Inject 19,400 Units into the skin once.  1 each  0   No current facility-administered medications on file prior to visit.     Review of Systems  Constitutional: Negative for fever and chills.  Genitourinary: Positive for dysuria, urgency and frequency. Negative for hematuria, flank pain and difficulty urinating.  All other systems reviewed and are negative.      Objective:  BP 130/74  Pulse 57  Temp(Src) 97.9 F (36.6 C) (Oral)  Resp 14  Wt 172 lb 8 oz (78.245 kg)  SpO2 94%   Physical Exam  Constitutional: She is oriented to person, place, and time. No distress.  Cardiovascular: Normal rate and regular rhythm.   Pulmonary/Chest: Effort normal. No respiratory distress.  Genitourinary:  No suprapubic discomfort.  Musculoskeletal: Normal range of motion.  Neurological: She is alert and oriented to person, place, and time.  Skin: Skin is warm and dry.  Psychiatric: She has a normal mood and affect. Her behavior is normal. Judgment and thought content normal.      Assessment & Plan:   1. UTI (urinary tract infection) UA shows some leukocytes. No nitrites. Recent completed septra x 3 days. Start Cipro 250 mg bid x 5 days. Hx of c diff following antibiotic use. She is taking probiotic twice daily and will continue. I has asked her to report any diarrhea immediately. Send urine for culture.  - POCT urinalysis dipstick - Urine culture

## 2014-01-13 NOTE — Progress Notes (Signed)
Pre visit review using our clinic review tool, if applicable. No additional management support is needed unless otherwise documented below in the visit note. 

## 2014-01-13 NOTE — Patient Instructions (Signed)
  Start Cipro 250 mg twice a day for 5 days.  Drink plenty of water.  Continue your probiotic  Report any diarrhea if it occurs.  If symptoms do not improve within 4-5 days please call the office.

## 2014-01-15 LAB — URINE CULTURE: Colony Count: >=100000

## 2014-02-02 ENCOUNTER — Ambulatory Visit (INDEPENDENT_AMBULATORY_CARE_PROVIDER_SITE_OTHER)
Admission: RE | Admit: 2014-02-02 | Discharge: 2014-02-02 | Disposition: A | Discharge status: Home / Self Care | Payer: Medicare Other | Source: Ambulatory Visit | Attending: Adult Health | Admitting: Adult Health

## 2014-02-02 ENCOUNTER — Ambulatory Visit (INDEPENDENT_AMBULATORY_CARE_PROVIDER_SITE_OTHER): Payer: Medicare Other | Admitting: Adult Health

## 2014-02-02 ENCOUNTER — Encounter: Payer: Self-pay | Admitting: Adult Health

## 2014-02-02 VITALS — BP 114/71 | HR 68 | Temp 98.3°F | Resp 14 | Wt 165.8 lb

## 2014-02-02 DIAGNOSIS — M25569 Pain in unspecified knee: Secondary | ICD-10-CM

## 2014-02-02 DIAGNOSIS — W1809XA Striking against other object with subsequent fall, initial encounter: Secondary | ICD-10-CM

## 2014-02-02 DIAGNOSIS — S298XXA Other specified injuries of thorax, initial encounter: Secondary | ICD-10-CM

## 2014-02-02 DIAGNOSIS — R197 Diarrhea, unspecified: Secondary | ICD-10-CM

## 2014-02-02 MED ORDER — METRONIDAZOLE 500 MG PO TABS: 500.0000 mg | ORAL_TABLET | Freq: Three times a day (TID) | ORAL | Status: DC

## 2014-02-02 NOTE — Progress Notes (Signed)
Pre visit review using our clinic review tool, if applicable. No additional management support is needed unless otherwise documented below in the visit note. 

## 2014-02-02 NOTE — Patient Instructions (Addendum)
  Please go to our [facility] office for x-ray of the right side of your ribs and left knee.  When I receive the results I will call you with further instructions.  Please provide stool samples and return them to the clinic to check for C. Diff  I am starting you on metronidazole 500 mg three times a day for c diff.

## 2014-02-02 NOTE — Progress Notes (Signed)
Patient ID: Katherine Hudson, female   DOB: [birth year], 68 y.o.   MRN: [id number]   Subjective:    Patient ID: Katherine Hudson, female    DOB: January 28, 1946, 68 y.o.   MRN: [id number]  HPI  68 y/o pt of Dr. Derrel Nip who presents to clinic follow a fall ~ 1 week ago. Fell 11 days ago going down stairs. She landed on her right side. She twisted the left knee. Feeling pain and discomfort on the right posterior rib cage. Pain when sneezing or coughing. Denies shortness of breath. Was having discomfort with taking deep breaths but this has improved. No other symptoms associated with fall.  She also reports having diarrhea. Treated with Cipro for UTI on 01/13/14. She reports diarrhea started 2 days prior to fall. Denies fever, chills. Hx of c diff. Having 6-8 episodes daily.   Past Medical History  Diagnosis Date  . Hyperlipidemia   . Hypertension   . Depression     Current Outpatient Prescriptions on File Prior to Visit  Medication Sig Dispense Refill  . aspirin 81 MG tablet Take 81 mg by mouth daily.        . Biotin 1000 MCG tablet Take 1,000 mcg by mouth 3 (three) times daily.      Marland Kitchen co-enzyme Q-10 30 MG capsule Take 30 mg by mouth 3 (three) times daily.      . fluticasone (FLONASE) 50 MCG/ACT nasal spray Place 2 sprays into the nose daily as needed.  16 g  5  . Glucosamine-Chondroit-Vit C-Mn (GLUCOSAMINE CHONDR 1500 COMPLX PO) Take by mouth 2 (two) times daily.        . hydrochlorothiazide (HYDRODIURIL) 25 MG tablet Take 1 tablet (25 mg total) by mouth daily.  90 tablet  3  . ibuprofen (ADVIL,MOTRIN) 200 MG tablet Take 200 mg by mouth as needed.        Marland Kitchen KRILL OIL PO Take by mouth daily.      Marland Kitchen lisinopril (PRINIVIL,ZESTRIL) 30 MG tablet Take 1 tablet (30 mg total) by mouth daily.  90 tablet  1  . loratadine (CLARITIN) 10 MG tablet Take 10 mg by mouth daily.      . methocarbamol (ROBAXIN) 750 MG tablet Take 750 mg by mouth at bedtime as needed.      . Multiple Vitamin (MULTIVITAMIN) tablet Take 1 tablet by  mouth daily.        . sertraline (ZOLOFT) 50 MG tablet Take 3 tablets (150 mg total) by mouth daily.  90 tablet  0  . simvastatin (ZOCOR) 40 MG tablet TAKE ONE TABLET BY MOUTH AT BEDTIME  90 tablet  1  . sulfamethoxazole-trimethoprim (SEPTRA DS) 800-160 MG per tablet Take 1 tablet by mouth 2 (two) times daily.  6 tablet  0  . TDaP (BOOSTRIX) 5-2.5-18.5 LF-MCG/0.5 injection Inject 0.5 mLs into the muscle once.  0.5 mL  0  . zoster vaccine live, PF, (ZOSTAVAX) 62694 UNT/0.65ML injection Inject 19,400 Units into the skin once.  1 each  0   No current facility-administered medications on file prior to visit.    Review of Systems  Constitutional: Positive for unexpected weight change (7 lb weight loss in 1 week). Negative for fever, chills and fatigue.  Gastrointestinal: Positive for diarrhea.  Musculoskeletal:       Right side posterior rib pain. Left knee pain.       Objective:  There were no vitals taken for this visit.   Physical Exam  Constitutional: She is  oriented to person, place, and time. No distress.  Cardiovascular: Normal rate and regular rhythm.   Pulmonary/Chest: Effort normal. No respiratory distress.  Abdominal: Soft. Bowel sounds are normal.  Musculoskeletal: Normal range of motion. She exhibits edema.  Left knee minimal edema  Neurological: She is alert and oriented to person, place, and time.  Skin: Skin is warm and dry.  Psychiatric: She has a normal mood and affect. Her behavior is normal. Judgment and thought content normal.      Assessment & Plan:   1. Fall against object, initial encounter Will obtain xrays to r/o fracture. We discussed that if positive for rib fracture the treatment is supportive.   - DG Knee Complete 4 Views Left; Future - DG Ribs Unilateral Right; Future  2. Diarrhea Hx of c diff, recently treated for UTI. Check for c diff given ongoing diarrhea. Start metronidazole to treat. She was advised to provide sample prior to starting  medication.  - Clostridium difficile EIA - metroNIDAZOLE (FLAGYL) 500 MG tablet; Take 1 tablet (500 mg total) by mouth 3 (three) times daily.  Dispense: 30 tablet; Refill: 0

## 2014-02-03 ENCOUNTER — Other Ambulatory Visit: Payer: Self-pay | Admitting: Adult Health

## 2014-02-03 ENCOUNTER — Telehealth: Payer: Self-pay | Admitting: Internal Medicine

## 2014-02-03 NOTE — Telephone Encounter (Signed)
Pt came in to office requesting refills.  States they need to be for 90 days.  1.  Simvastatin 40 mg  2.  Sertraline hydrochloride 50 mg tab   Not sure about lisinopril, states she will need this one soon.    Wants loose tabs, states she cannot open the little boxes.

## 2014-02-03 NOTE — Telephone Encounter (Signed)
Please see previous note.  Pt states she would like a call when these have been called in.  States the pharmacy does not call her to let her know they are there, so she needs a call from [place].

## 2014-02-04 ENCOUNTER — Telehealth: Payer: Self-pay

## 2014-02-04 LAB — C. DIFFICILE GDH AND TOXIN A/B
C. difficile GDH: DETECTED — AB
C. difficile Toxin A/B: DETECTED — AB

## 2014-02-04 NOTE — Telephone Encounter (Signed)
Spoke with patient to notify her of positive lab results for C. Diff. Per Raquel advised patient to continue taking the prescribed Flagyl Rx and schedule a follow up appt once flagyl is complete. Patient verbalized understanding. Patient stated that she is feeling better and will call back later to schedule follow up. Advised patient to call the office if symptoms get worse.

## 2014-02-04 NOTE — Telephone Encounter (Signed)
Critical lab: Positive for C. Diff

## 2014-02-05 MED ORDER — SERTRALINE HCL 50 MG PO TABS: 150.0000 mg | ORAL_TABLET | Freq: Every day | ORAL | Status: DC

## 2014-02-05 NOTE — Telephone Encounter (Signed)
Sertraline sent to pharmacy, pt to check with pharmacy regarding Simvastatin refills, shows should have refill available. Also advised she would need a lab appt soon since last labs 10/14, states she will call back to schedule.

## 2014-02-12 ENCOUNTER — Other Ambulatory Visit: Payer: Self-pay | Admitting: Internal Medicine

## 2014-02-16 ENCOUNTER — Telehealth: Payer: Self-pay | Admitting: *Deleted

## 2014-02-16 DIAGNOSIS — R5383 Other fatigue: Principal | ICD-10-CM

## 2014-02-16 DIAGNOSIS — E559 Vitamin D deficiency, unspecified: Secondary | ICD-10-CM

## 2014-02-16 DIAGNOSIS — R5381 Other malaise: Secondary | ICD-10-CM

## 2014-02-16 DIAGNOSIS — R634 Abnormal weight loss: Secondary | ICD-10-CM

## 2014-02-16 DIAGNOSIS — E785 Hyperlipidemia, unspecified: Secondary | ICD-10-CM

## 2014-02-16 NOTE — Telephone Encounter (Signed)
Pt is coming in tomorrow what labs and dx?  

## 2014-02-17 ENCOUNTER — Other Ambulatory Visit (INDEPENDENT_AMBULATORY_CARE_PROVIDER_SITE_OTHER): Payer: Medicare Other

## 2014-02-17 DIAGNOSIS — R5381 Other malaise: Secondary | ICD-10-CM

## 2014-02-17 DIAGNOSIS — E785 Hyperlipidemia, unspecified: Secondary | ICD-10-CM

## 2014-02-17 DIAGNOSIS — R634 Abnormal weight loss: Secondary | ICD-10-CM

## 2014-02-17 DIAGNOSIS — E559 Vitamin D deficiency, unspecified: Secondary | ICD-10-CM

## 2014-02-17 DIAGNOSIS — R5383 Other fatigue: Principal | ICD-10-CM

## 2014-02-17 LAB — COMPREHENSIVE METABOLIC PANEL
ALBUMIN: 3.8 g/dL (ref 3.5–5.2)
ALT: 22 U/L (ref 0–35)
AST: 24 U/L (ref 0–37)
Alkaline Phosphatase: 60 U/L (ref 39–117)
BUN: 17 mg/dL (ref 6–23)
CO2: 28 mEq/L (ref 19–32)
Calcium: 9.2 mg/dL (ref 8.4–10.5)
Chloride: 105 mEq/L (ref 96–112)
Creatinine, Ser: 0.8 mg/dL (ref 0.4–1.2)
GFR: 81.64 mL/min (ref >60.00)
Glucose, Bld: 107 mg/dL — ABNORMAL HIGH (ref 70–99)
POTASSIUM: 4.4 meq/L (ref 3.5–5.1)
SODIUM: 139 meq/L (ref 135–145)
Total Bilirubin: 0.7 mg/dL (ref 0.2–1.2)
Total Protein: 6.2 g/dL (ref 6.0–8.3)

## 2014-02-17 LAB — LIPID PANEL
Cholesterol: 140 mg/dL (ref 0–200)
HDL: 36.4 mg/dL — AB (ref >39.00)
LDL Cholesterol: 70 mg/dL (ref 0–99)
NONHDL: 103.6
TRIGLYCERIDES: 166 mg/dL — AB (ref 0.0–149.0)
Total CHOL/HDL Ratio: 4
VLDL: 33.2 mg/dL (ref 0.0–40.0)

## 2014-02-17 LAB — VITAMIN D 25 HYDROXY (VIT D DEFICIENCY, FRACTURES): VITD: 27.47 ng/mL

## 2014-02-17 LAB — TSH: TSH: 1.59 u[IU]/mL (ref 0.35–4.50)

## 2014-02-17 LAB — CBC WITH DIFFERENTIAL/PLATELET
Basophils Absolute: 0 10*3/uL (ref 0.0–0.1)
Basophils Relative: 0.6 % (ref 0.0–3.0)
Eosinophils Absolute: 0 10*3/uL (ref 0.0–0.7)
Eosinophils Relative: 0.3 % (ref 0.0–5.0)
HCT: 42.3 % (ref 36.0–46.0)
Hemoglobin: 13.9 g/dL (ref 12.0–15.0)
Lymphocytes Relative: 27.1 % (ref 12.0–46.0)
Lymphs Abs: 1.5 10*3/uL (ref 0.7–4.0)
MCHC: 32.9 g/dL (ref 30.0–36.0)
MCV: 85.8 fl (ref 78.0–100.0)
Monocytes Absolute: 0.4 10*3/uL (ref 0.1–1.0)
Monocytes Relative: 7.1 % (ref 3.0–12.0)
Neutro Abs: 3.7 10*3/uL (ref 1.4–7.7)
Neutrophils Relative %: 64.9 % (ref 43.0–77.0)
Platelets: 213 10*3/uL (ref 150.0–400.0)
RBC: 4.93 Mil/uL (ref 3.87–5.11)
RDW: 14.9 % (ref 11.5–15.5)
WBC: 5.7 10*3/uL (ref 4.0–10.5)

## 2014-02-17 LAB — MAGNESIUM: MAGNESIUM: 1.9 mg/dL (ref 1.5–2.5)

## 2014-02-18 ENCOUNTER — Other Ambulatory Visit: Payer: Self-pay | Admitting: Internal Medicine

## 2014-02-18 ENCOUNTER — Ambulatory Visit (INDEPENDENT_AMBULATORY_CARE_PROVIDER_SITE_OTHER): Payer: Medicare Other | Admitting: Internal Medicine

## 2014-02-18 ENCOUNTER — Encounter: Payer: Self-pay | Admitting: Internal Medicine

## 2014-02-18 VITALS — BP 110/60 | HR 69 | Temp 98.7°F | Ht 65.0 in | Wt 168.2 lb

## 2014-02-18 DIAGNOSIS — A0472 Enterocolitis due to Clostridium difficile, not specified as recurrent: Secondary | ICD-10-CM

## 2014-02-18 DIAGNOSIS — E559 Vitamin D deficiency, unspecified: Secondary | ICD-10-CM | POA: Insufficient documentation

## 2014-02-18 MED ORDER — SERTRALINE HCL 50 MG PO TABS: 150.0000 mg | ORAL_TABLET | Freq: Every day | ORAL | Status: DC

## 2014-02-18 MED ORDER — ERGOCALCIFEROL 1.25 MG (50000 UT) PO CAPS: 50000.0000 [IU] | ORAL_CAPSULE | ORAL | Status: DC

## 2014-02-18 MED ORDER — SIMVASTATIN 40 MG PO TABS: ORAL_TABLET | ORAL | Status: DC

## 2014-02-18 NOTE — Progress Notes (Signed)
Pre visit review using our clinic review tool, if applicable. No additional management support is needed unless otherwise documented below in the visit note. 

## 2014-02-18 NOTE — Patient Instructions (Signed)
We will test your stool again for C dificile colitis  If it is positive ,  We will set you up to see Dr Vira Agar again  Your liver enzymes and cholesterol are normal,  So we will refill the simvastatin  Your vitamin D is low, which can increase your risk of weak bones and fractures and interfere with your body's ability to absorb the calcium in your diet.   I am calling in a megadose of Vit D to take once weekly ,  Then after you finish the weekly supplement, you should start taking an OTC  Vit D3 supplement 1000 units daily.

## 2014-02-18 NOTE — Progress Notes (Addendum)
Patient ID: Katherine Hudson, female   DOB: 1946-06-14, 68 y.o.   MRN: [id number]   Patient Active Problem List   Diagnosis Date Noted  . Hypovitaminosis D 02/18/2014  . Osteopenia 07/02/2013  . Routine general medical examination at a health care facility 06/03/2013  . 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 Restless legs syndrome 05/02/2012  . Chronic insomnia 03/27/2012  . Loss of libido 03/27/2012  . Screening for cervical cancer 07/24/2011  . Hyperlipidemia   . Hypertension   . Depression     Subjective:  CC:   Chief Complaint  Patient presents with  . Follow-up    f/u from c-diff & fall    HPI:   Katherine Hudson is a 68 y.o. female who presents for Recurrent diarrhea.  Patient has a history of c dificile colitis requiring hospitalization, use of oral vancomycin  and ultimately a  stool transplant in Dec  2014  By Dr. Vira Agar before the infection resolved.  She was treated recently for a UTI and concurrently treated with metronidazole.  She finished the metronidazole  Days ago and has noted an increased frequency and decrease in fomred stools over the last 3 days.  She is currently having 5/day and formed but over the last 24 hoyrs has been more liquid in texutre    Past Medical History  Diagnosis Date  . Hyperlipidemia   . Hypertension   . Depression     Past Surgical History  Procedure Laterality Date  . Appendectomy    . Knee surgery  2000    right  . Rotator cuff repair  1998    right       The following portions of the patient's history were reviewed and updated as appropriate: Allergies, current medications, and problem list.    Review of Systems:   Patient denies headache, fevers, malaise, unintentional weight loss, skin rash, eye pain, sinus congestion and sinus pain, sore throat, dysphagia,  hemoptysis , cough, dyspnea, wheezing, chest pain, palpitations, orthopnea, edema, abdominal pain, nausea, melena, diarrhea, constipation, flank pain,  dysuria, hematuria, urinary  Frequency, nocturia, numbness, tingling, seizures,  Focal weakness, Loss of consciousness,  Tremor, insomnia, depression, anxiety, and suicidal ideation.     History   Social History  . Marital Status: Married    Spouse Name: N/A    Number of Children: N/A  . Years of Education: N/A   Occupational History  . Not on file.   Social History Main Topics  . Smoking status: Former Research scientist (life sciences)  . Smokeless tobacco: Never Used     Comment: quit 1980  . Alcohol Use: Yes     Comment: occassionally  . Drug Use: No  . Sexual Activity: Not on file   Other Topics Concern  . Not on file   Social History Narrative  . No narrative on file    Objective:  Filed Vitals:   02/18/14 1546  BP: 110/60  Pulse: 69  Temp: 98.7 F (37.1 C)     General appearance: alert, cooperative and appears stated age Ears: normal TM's and external ear canals both ears Throat: lips, mucosa, and tongue normal; teeth and gums normal Neck: no adenopathy, no carotid bruit, supple, symmetrical, trachea midline and thyroid not enlarged, symmetric, no tenderness/mass/nodules Back: symmetric, no curvature. ROM normal. No CVA tenderness. Lungs: clear to auscultation bilaterally Heart: regular rate and rhythm, S1, S2 normal, no murmur, click, rub or gallop Abdomen: soft, non-tender; bowel sounds normal; no masses,  no  organomegaly Pulses: 2+ and symmetric Skin: Skin color, texture, turgor normal. No rashes or lesions Lymph nodes: Cervical, supraclavicular, and axillary nodes normal.  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  Confirmed by today's testing .  Resume flagyl and refer to Dr Vira Agar.    Updated Medication List Outpatient Encounter Prescriptions as of 02/18/2014  Medication Sig  . aspirin 81 MG tablet Take 81 mg by mouth daily.    . Biotin 1000 MCG tablet Take 1,000 mcg by  mouth 3 (three) times daily.  Marland Kitchen co-enzyme Q-10 30 MG capsule Take 30 mg by mouth 3 (three) times daily.  . ergocalciferol (DRISDOL) 50000 UNITS capsule Take 1 capsule (50,000 Units total) by mouth once a week.  . fluticasone (FLONASE) 50 MCG/ACT nasal spray Place 2 sprays into the nose daily as needed.  . Glucosamine-Chondroit-Vit C-Mn (GLUCOSAMINE CHONDR 1500 COMPLX PO) Take by mouth 2 (two) times daily.    . hydrochlorothiazide (HYDRODIURIL) 25 MG tablet Take 1 tablet (25 mg total) by mouth daily.  Marland Kitchen ibuprofen (ADVIL,MOTRIN) 200 MG tablet Take 200 mg by mouth as needed.    Marland Kitchen KRILL OIL PO Take by mouth daily.  Marland Kitchen lisinopril (PRINIVIL,ZESTRIL) 30 MG tablet TAKE ONE TABLET BY MOUTH ONCE DAILY  . loratadine (CLARITIN) 10 MG tablet Take 10 mg by mouth daily.  . Multiple Vitamin (MULTIVITAMIN) tablet Take 1 tablet by mouth daily.    . sertraline (ZOLOFT) 50 MG tablet Take 3 tablets (150 mg total) by mouth daily.  . simvastatin (ZOCOR) 40 MG tablet TAKE ONE TABLET BY MOUTH AT BEDTIME  . [DISCONTINUED] lisinopril (PRINIVIL,ZESTRIL) 30 MG tablet Take 1 tablet (30 mg total) by mouth daily.  . [DISCONTINUED] methocarbamol (ROBAXIN) 750 MG tablet Take 750 mg by mouth at bedtime as needed.  . [DISCONTINUED] sertraline (ZOLOFT) 50 MG tablet Take 3 tablets (150 mg total) by mouth daily.  . [DISCONTINUED] simvastatin (ZOCOR) 40 MG tablet TAKE ONE TABLET BY MOUTH AT BEDTIME  . TDaP (BOOSTRIX) 5-2.5-18.5 LF-MCG/0.5 injection Inject 0.5 mLs into the muscle once.  . zoster vaccine live, PF, (ZOSTAVAX) 23762 UNT/0.65ML injection Inject 19,400 Units into the skin once.  . [DISCONTINUED] metroNIDAZOLE (FLAGYL) 500 MG tablet Take 1 tablet (500 mg total) by mouth 3 (three) times daily.     Orders Placed This Encounter  Procedures  . Clostridium difficile EIA  . Stool culture    No Follow-up on file.

## 2014-02-18 NOTE — Addendum Note (Signed)
Addended by: Crecencio Mc on: 02/18/2014 01:37 PM   Modules accepted: Orders

## 2014-02-19 ENCOUNTER — Telehealth: Payer: Self-pay | Admitting: *Deleted

## 2014-02-19 ENCOUNTER — Encounter: Payer: Self-pay | Admitting: *Deleted

## 2014-02-19 LAB — C. DIFFICILE GDH AND TOXIN A/B
C. DIFFICILE GDH: DETECTED — AB
C. difficile Toxin A/B: DETECTED — AB

## 2014-02-19 MED ORDER — METRONIDAZOLE 500 MG PO TABS: 500.0000 mg | ORAL_TABLET | Freq: Four times a day (QID) | ORAL | Status: DC

## 2014-02-19 NOTE — Telephone Encounter (Signed)
Katherine Hudson from [place] lab called to report C Diff was detected.

## 2014-02-19 NOTE — Telephone Encounter (Signed)
She has recurrent c dificile.  i will send metronidazole to pharmacy,  Referral to [facility] pending

## 2014-02-19 NOTE — Assessment & Plan Note (Signed)
Recurrent,  By repeat studies.  [facility] referral and metronidazole rx

## 2014-02-19 NOTE — Addendum Note (Signed)
Addended by: Crecencio Mc on: 02/19/2014 05:01 PM   Modules accepted: Orders

## 2014-02-20 MED ORDER — METRONIDAZOLE 50 MG/ML ORAL SUSPENSION: 500.0000 mg | Freq: Four times a day (QID) | ORAL | Status: DC

## 2014-02-20 NOTE — Addendum Note (Signed)
Addended by: Crecencio Mc on: 02/20/2014 01:28 PM   Modules accepted: Orders

## 2014-02-20 NOTE — Telephone Encounter (Signed)
Spoke with pt results given °

## 2014-02-20 NOTE — Telephone Encounter (Signed)
Left message for patient to return call to office. 

## 2014-02-21 NOTE — Assessment & Plan Note (Addendum)
Recurrent, secondary to treated UTI in early June.  By current tesitng.  Resume flagyl and refer to Dr Vira Agar.

## 2014-02-22 LAB — STOOL CULTURE

## 2014-06-11 ENCOUNTER — Telehealth: Payer: Self-pay | Admitting: Internal Medicine

## 2014-06-11 NOTE — Telephone Encounter (Signed)
Katherine Hudson would like a phone call with the results of her knee xray. Pt ph# [phone] Thank you.

## 2014-06-11 NOTE — Telephone Encounter (Signed)
SEE MY PREVISOUSLY UNROUTED RESPONSE.  EPIC STRIKES AGAIN

## 2014-06-11 NOTE — Telephone Encounter (Signed)
PLEASE SEE THE COMMENTS IN THE CHART,  THE X RAY WAS ORDERED IN June BY RAQUEL AND THE RESULTS WERE GIVEN TO PATIENT IN June

## 2014-06-11 NOTE — Telephone Encounter (Signed)
Do you have these results

## 2014-06-12 NOTE — Telephone Encounter (Signed)
Left patient a voicemail asking if she had anything recent other than the xray in June (which results were given at that time). Also asked pt to call back if she had any further questions & ask for triage nurse or Dr. Lupita Dawn nurse.

## 2014-09-18 ENCOUNTER — Encounter (INDEPENDENT_AMBULATORY_CARE_PROVIDER_SITE_OTHER): Payer: Self-pay

## 2014-09-18 ENCOUNTER — Encounter: Payer: Self-pay | Admitting: Internal Medicine

## 2014-09-18 ENCOUNTER — Ambulatory Visit (INDEPENDENT_AMBULATORY_CARE_PROVIDER_SITE_OTHER): Payer: Medicare Other | Admitting: Internal Medicine

## 2014-09-18 VITALS — BP 154/70 | HR 59 | Temp 97.6°F | Resp 14 | Ht 66.0 in | Wt 183.5 lb

## 2014-09-18 DIAGNOSIS — R351 Nocturia: Secondary | ICD-10-CM

## 2014-09-18 DIAGNOSIS — A0472 Enterocolitis due to Clostridium difficile, not specified as recurrent: Secondary | ICD-10-CM

## 2014-09-18 DIAGNOSIS — R5383 Other fatigue: Secondary | ICD-10-CM

## 2014-09-18 DIAGNOSIS — Z1239 Encounter for other screening for malignant neoplasm of breast: Secondary | ICD-10-CM

## 2014-09-18 DIAGNOSIS — G4700 Insomnia, unspecified: Secondary | ICD-10-CM

## 2014-09-18 DIAGNOSIS — N3281 Overactive bladder: Secondary | ICD-10-CM

## 2014-09-18 DIAGNOSIS — F5104 Psychophysiologic insomnia: Secondary | ICD-10-CM

## 2014-09-18 DIAGNOSIS — G8929 Other chronic pain: Secondary | ICD-10-CM

## 2014-09-18 DIAGNOSIS — E785 Hyperlipidemia, unspecified: Secondary | ICD-10-CM

## 2014-09-18 DIAGNOSIS — M25562 Pain in left knee: Secondary | ICD-10-CM

## 2014-09-18 DIAGNOSIS — Z0000 Encounter for general adult medical examination without abnormal findings: Secondary | ICD-10-CM

## 2014-09-18 DIAGNOSIS — I10 Essential (primary) hypertension: Secondary | ICD-10-CM

## 2014-09-18 DIAGNOSIS — Z1159 Encounter for screening for other viral diseases: Secondary | ICD-10-CM

## 2014-09-18 DIAGNOSIS — E559 Vitamin D deficiency, unspecified: Secondary | ICD-10-CM

## 2014-09-18 LAB — URINALYSIS, ROUTINE W REFLEX MICROSCOPIC
Ketones, ur: NEGATIVE
Leukocytes, UA: NEGATIVE
NITRITE: NEGATIVE
Total Protein, Urine: NEGATIVE
Urine Glucose: NEGATIVE
Urobilinogen, UA: 0.2 (ref 0.0–1.0)
pH: 5.5 (ref 5.0–8.0)

## 2014-09-18 LAB — COMPREHENSIVE METABOLIC PANEL
ALK PHOS: 63 U/L (ref 39–117)
ALT: 20 U/L (ref 0–35)
AST: 21 U/L (ref 0–37)
Albumin: 4.7 g/dL (ref 3.5–5.2)
BUN: 22 mg/dL (ref 6–23)
CALCIUM: 9.9 mg/dL (ref 8.4–10.5)
CO2: 26 mEq/L (ref 19–32)
CREATININE: 0.9 mg/dL (ref 0.40–1.20)
Chloride: 110 mEq/L (ref 96–112)
GFR: 66.04 mL/min (ref >60.00)
Glucose, Bld: 97 mg/dL (ref 70–99)
Potassium: 4.5 mEq/L (ref 3.5–5.1)
Sodium: 145 mEq/L (ref 135–145)
Total Bilirubin: 1.1 mg/dL (ref 0.2–1.2)
Total Protein: 7.5 g/dL (ref 6.0–8.3)

## 2014-09-18 LAB — CBC WITH DIFFERENTIAL/PLATELET
Basophils Absolute: 0 10*3/uL (ref 0.0–0.1)
Basophils Relative: 0.6 % (ref 0.0–3.0)
Eosinophils Absolute: 0.1 10*3/uL (ref 0.0–0.7)
Eosinophils Relative: 1.7 % (ref 0.0–5.0)
HCT: 45.9 % (ref 36.0–46.0)
Hemoglobin: 15.7 g/dL — ABNORMAL HIGH (ref 12.0–15.0)
LYMPHS PCT: 18.7 % (ref 12.0–46.0)
Lymphs Abs: 1.3 10*3/uL (ref 0.7–4.0)
MCHC: 34.2 g/dL (ref 30.0–36.0)
MCV: 82.2 fl (ref 78.0–100.0)
MONOS PCT: 5.2 % (ref 3.0–12.0)
Monocytes Absolute: 0.4 10*3/uL (ref 0.1–1.0)
NEUTROS PCT: 73.8 % (ref 43.0–77.0)
Neutro Abs: 5.3 10*3/uL (ref 1.4–7.7)
PLATELETS: 184 10*3/uL (ref 150.0–400.0)
RBC: 5.59 Mil/uL — ABNORMAL HIGH (ref 3.87–5.11)
RDW: 14.3 % (ref 11.5–15.5)
WBC: 7.1 10*3/uL (ref 4.0–10.5)

## 2014-09-18 LAB — LIPID PANEL
CHOL/HDL RATIO: 4
Cholesterol: 176 mg/dL (ref 0–200)
HDL: 47.5 mg/dL (ref >39.00)
NONHDL: 128.5
TRIGLYCERIDES: 207 mg/dL — AB (ref 0.0–149.0)
VLDL: 41.4 mg/dL — AB (ref 0.0–40.0)

## 2014-09-18 LAB — VITAMIN D 25 HYDROXY (VIT D DEFICIENCY, FRACTURES): VITD: 28.86 ng/mL — AB (ref 30.00–100.00)

## 2014-09-18 LAB — LDL CHOLESTEROL, DIRECT: LDL DIRECT: 106 mg/dL

## 2014-09-18 LAB — TSH: TSH: 2.15 u[IU]/mL (ref 0.35–4.50)

## 2014-09-18 MED ORDER — ALPRAZOLAM 0.5 MG PO TABS: 0.5000 mg | ORAL_TABLET | Freq: Every evening | ORAL | Status: DC | PRN

## 2014-09-18 MED ORDER — SOLIFENACIN SUCCINATE 5 MG PO TABS: 5.0000 mg | ORAL_TABLET | Freq: Every day | ORAL | Status: DC

## 2014-09-18 NOTE — Patient Instructions (Addendum)
Tiral of alprazolam for insomnia.  Do not mix with alcohol or share with other people.  It is a controlled substance   Trial of bladder medication (Vesicare) for overactive bladder   Please ask Katherine Hudson to take his wellbutrin twice daily every day,  It will help him lose weight   Ask your insurance company about the PAP smear.  It is $>200 if they don't pay for it.   Health Maintenance Adopting a healthy lifestyle and getting preventive care can go a long way to promote health and wellness. Talk with your health care provider about what schedule of regular examinations is right for you. This is a good chance for you to check in with your provider about disease prevention and staying healthy. In between checkups, there are plenty of things you can do on your own. Experts have done a lot of research about which lifestyle changes and preventive measures are most likely to keep you healthy. Ask your health care provider for more information. WEIGHT AND DIET  Eat a healthy diet  Be sure to include plenty of vegetables, fruits, low-fat dairy products, and lean protein.  Do not eat a lot of foods high in solid fats, added sugars, or salt.  Get regular exercise. This is one of the most important things you can do for your health.  Most adults should exercise for at least 150 minutes each week. The exercise should increase your heart rate and make you sweat (moderate-intensity exercise).  Most adults should also do strengthening exercises at least twice a week. This is in addition to the moderate-intensity exercise.  Maintain a healthy weight  Body mass index (BMI) is a measurement that can be used to identify possible weight problems. It estimates body fat based on height and weight. Your health care provider can help determine your BMI and help you achieve or maintain a healthy weight.  For females 69 years of age and older:   A BMI below 18.5 is considered underweight.  A BMI of 18.5 to 24.9  is normal.  A BMI of 25 to 29.9 is considered overweight.  A BMI of 30 and above is considered obese.  Watch levels of cholesterol and blood lipids  You should start having your blood tested for lipids and cholesterol at 69 years of age, then have this test every 5 years.  You may need to have your cholesterol levels checked more often if:  Your lipid or cholesterol levels are high.  You are older than 69 years of age.  You are at high risk for heart disease.  CANCER SCREENING   Lung Cancer  Lung cancer screening is recommended for adults 57-29 years old who are at high risk for lung cancer because of a history of smoking.  A yearly low-dose CT scan of the lungs is recommended for people who:  Currently smoke.  Have quit within the past 15 years.  Have at least a 30-pack-year history of smoking. A pack year is smoking an average of one pack of cigarettes a day for 1 year.  Yearly screening should continue until it has been 15 years since you quit.  Yearly screening should stop if you develop a health problem that would prevent you from having lung cancer treatment.  Breast Cancer  Practice breast self-awareness. This means understanding how your breasts normally appear and feel.  It also means doing regular breast self-exams. Let your health care provider know about any changes, no matter how small.  If  you are in your 20s or 30s, you should have a clinical breast exam (CBE) by a health care provider every 1-3 years as part of a regular health exam.  If you are 86 or older, have a CBE every year. Also consider having a breast X-ray (mammogram) every year.  If you have a family history of breast cancer, talk to your health care provider about genetic screening.  If you are at high risk for breast cancer, talk to your health care provider about having an MRI and a mammogram every year.  Breast cancer gene (BRCA) assessment is recommended for women who have family  members with BRCA-related cancers. BRCA-related cancers include:  Breast.  Ovarian.  Tubal.  Peritoneal cancers.  Results of the assessment will determine the need for genetic counseling and BRCA1 and BRCA2 testing. Cervical Cancer Routine pelvic examinations to screen for cervical cancer are no longer recommended for nonpregnant women who are considered low risk for cancer of the pelvic organs (ovaries, uterus, and vagina) and who do not have symptoms. A pelvic examination may be necessary if you have symptoms including those associated with pelvic infections. Ask your health care provider if a screening pelvic exam is right for you.   The Pap test is the screening test for cervical cancer for women who are considered at risk.  If you had a hysterectomy for a problem that was not cancer or a condition that could lead to cancer, then you no longer need Pap tests.  If you are older than 65 years, and you have had normal Pap tests for the past 10 years, you no longer need to have Pap tests.  If you have had past treatment for cervical cancer or a condition that could lead to cancer, you need Pap tests and screening for cancer for at least 20 years after your treatment.  If you no longer get a Pap test, assess your risk factors if they change (such as having a new sexual partner). This can affect whether you should start being screened again.  Some women have medical problems that increase their chance of getting cervical cancer. If this is the case for you, your health care provider may recommend more frequent screening and Pap tests.  The human papillomavirus (HPV) test is another test that may be used for cervical cancer screening. The HPV test looks for the virus that can cause cell changes in the cervix. The cells collected during the Pap test can be tested for HPV.  The HPV test can be used to screen women 31 years of age and older. Getting tested for HPV can extend the interval  between normal Pap tests from three to five years.  An HPV test also should be used to screen women of any age who have unclear Pap test results.  After 69 years of age, women should have HPV testing as often as Pap tests.  Colorectal Cancer  This type of cancer can be detected and often prevented.  Routine colorectal cancer screening usually begins at 69 years of age and continues through 69 years of age.  Your health care provider may recommend screening at an earlier age if you have risk factors for colon cancer.  Your health care provider may also recommend using home test kits to check for hidden blood in the stool.  A small camera at the end of a tube can be used to examine your colon directly (sigmoidoscopy or colonoscopy). This is done to check for the  earliest forms of colorectal cancer.  Routine screening usually begins at age 65.  Direct examination of the colon should be repeated every 5-10 years through 69 years of age. However, you may need to be screened more often if early forms of precancerous polyps or small growths are found. Skin Cancer  Check your skin from head to toe regularly.  Tell your health care provider about any new moles or changes in moles, especially if there is a change in a mole's shape or color.  Also tell your health care provider if you have a mole that is larger than the size of a pencil eraser.  Always use sunscreen. Apply sunscreen liberally and repeatedly throughout the day.  Protect yourself by wearing long sleeves, pants, a wide-brimmed hat, and sunglasses whenever you are outside. HEART DISEASE, DIABETES, AND HIGH BLOOD PRESSURE   Have your blood pressure checked at least every 1-2 years. High blood pressure causes heart disease and increases the risk of stroke.  If you are between 54 years and 31 years old, ask your health care provider if you should take aspirin to prevent strokes.  Have regular diabetes screenings. This involves  taking a blood sample to check your fasting blood sugar level.  If you are at a normal weight and have a low risk for diabetes, have this test once every three years after 69 years of age.  If you are overweight and have a high risk for diabetes, consider being tested at a younger age or more often. PREVENTING INFECTION  Hepatitis B  If you have a higher risk for hepatitis B, you should be screened for this virus. You are considered at high risk for hepatitis B if:  You were born in a country where hepatitis B is common. Ask your health care provider which countries are considered high risk.  Your parents were born in a high-risk country, and you have not been immunized against hepatitis B (hepatitis B vaccine).  You have HIV or AIDS.  You use needles to inject street drugs.  You live with someone who has hepatitis B.  You have had sex with someone who has hepatitis B.  You get hemodialysis treatment.  You take certain medicines for conditions, including cancer, organ transplantation, and autoimmune conditions. Hepatitis C  Blood testing is recommended for:  Everyone born from [birth year] through 1965.  Anyone with known risk factors for hepatitis C. Sexually transmitted infections (STIs)  You should be screened for sexually transmitted infections (STIs) including gonorrhea and chlamydia if:  You are sexually active and are younger than 69 years of age.  You are older than 69 years of age and your health care provider tells you that you are at risk for this type of infection.  Your sexual activity has changed since you were last screened and you are at an increased risk for chlamydia or gonorrhea. Ask your health care provider if you are at risk.  If you do not have HIV, but are at risk, it may be recommended that you take a prescription medicine daily to prevent HIV infection. This is called pre-exposure prophylaxis (PrEP). You are considered at risk if:  You are sexually active  and do not regularly use condoms or know the HIV status of your partner(s).  You take drugs by injection.  You are sexually active with a partner who has HIV. Talk with your health care provider about whether you are at high risk of being infected with HIV. If you choose  to begin PrEP, you should first be tested for HIV. You should then be tested every 3 months for as long as you are taking PrEP.  PREGNANCY   If you are premenopausal and you may become pregnant, ask your health care provider about preconception counseling.  If you may become pregnant, take 400 to 800 micrograms (mcg) of folic acid every day.  If you want to prevent pregnancy, talk to your health care provider about birth control (contraception). OSTEOPOROSIS AND MENOPAUSE   Osteoporosis is a disease in which the bones lose minerals and strength with aging. This can result in serious bone fractures. Your risk for osteoporosis can be identified using a bone density scan.  If you are 60 years of age or older, or if you are at risk for osteoporosis and fractures, ask your health care provider if you should be screened.  Ask your health care provider whether you should take a calcium or vitamin D supplement to lower your risk for osteoporosis.  Menopause may have certain physical symptoms and risks.  Hormone replacement therapy may reduce some of these symptoms and risks. Talk to your health care provider about whether hormone replacement therapy is right for you.  HOME CARE INSTRUCTIONS   Schedule regular health, dental, and eye exams.  Stay current with your immunizations.   Do not use any tobacco products including cigarettes, chewing tobacco, or electronic cigarettes.  If you are pregnant, do not drink alcohol.  If you are breastfeeding, limit how much and how often you drink alcohol.  Limit alcohol intake to no more than 1 drink per day for nonpregnant women. One drink equals 12 ounces of beer, 5 ounces of wine,  or 1 ounces of hard liquor.  Do not use street drugs.  Do not share needles.  Ask your health care provider for help if you need support or information about quitting drugs.  Tell your health care provider if you often feel depressed.  Tell your health care provider if you have ever been abused or do not feel safe at home. Document Released: 02/06/2011 Document Revised: 12/08/2013 Document Reviewed: 06/25/2013 [facility] Patient Information 2015 [place], [place]. This information is not intended to replace advice given to you by your health care provider. Make sure you discuss any questions you have with your health care provider.

## 2014-09-18 NOTE — Progress Notes (Signed)
Pre-visit discussion using our clinic review tool. No additional management support is needed unless otherwise documented below in the visit note.  

## 2014-09-18 NOTE — Progress Notes (Addendum)
Patient ID: Katherine Hudson, female   DOB: 08-Jul-1946, 69 y.o.   MRN: [id number]  The patient is here for annual Medicare wellness examination and management of other chronic and acute problems, including hypertension .  Did not take 2 bp meds this morning bc of labs,  Has recorded normal readings at home 140/80 or less   Last Fall was June 2015 Occurred while descending a wooden staircase,.  Felt knee wrench and has been having recurrent pain with lying on her side  On the medial side of knee,  But not with walkign or standing,  Plain films were done at time of injury .  Requesting MRI   Not sleeping well.  No change with melatonin  Unless "its a real bad day" She has been having nocturia  Chronically as many as  4-5 times per night,  Currently only 3 ,  But now has urgency and occasional incontinence.   has tried OTC oxybutynine patches for a short trial helped initially on first try .  Denies dysuria and back pain.  History of c dificile colitis   She has a history of depression treated with sertraline     The risk factors are reflected in the social history.  The roster of all physicians providing medical care to patient - is listed in the Snapshot section of the chart.  Activities of daily living:  The patient is 100% independent in all ADLs: dressing, toileting, feeding as well as independent mobility  Home safety : The patient has smoke detectors in the home. They wear seatbelts.  There are no firearms at home. There is no violence in the home.   There is no risks for hepatitis, STDs or HIV. There is no   history of blood transfusion. They have no travel history to infectious disease endemic areas of the world.  The patient has seen their dentist in the last six month. They have seen their eye doctor in the last year. They admit to slight hearing difficulty with regard to whispered voices and some television programs.  They have deferred audiologic testing in the last year.  They do not  have  excessive sun exposure. Discussed the need for sun protection: hats, long sleeves and use of sunscreen if there is significant sun exposure.   Diet: the importance of a healthy diet is discussed. They do have a healthy diet.  The benefits of regular aerobic exercise were discussed. She walks 4 times per week ,  20 minutes.   Depression screen: there are no signs or vegative symptoms of depression- irritability, change in appetite, anhedonia, sadness/tearfullness.  Cognitive assessment: the patient manages all their financial and personal affairs and is actively engaged. They could relate day,date,year and events; recalled 2/3 objects at 3 minutes; performed clock-face test normally.  The following portions of the patient's history were reviewed and updated as appropriate: allergies, current medications, past family history, past medical history,  past surgical history, past social history  and problem list.  Visual acuity was not assessed per patient preference since she has regular follow up with her ophthalmologist. Hearing and body mass index were assessed and reviewed.   During the course of the visit the patient was educated and counseled about appropriate screening and preventive services including : fall prevention , diabetes screening, nutrition counseling, colorectal cancer screening, and recommended immunizations.    Review of Systems:  Patient denies headache, fevers, malaise, unintentional weight loss, skin rash, eye pain, sinus congestion and sinus pain,  sore throat, dysphagia,  hemoptysis , cough, dyspnea, wheezing, chest pain, palpitations, orthopnea, edema, abdominal pain, nausea, melena, diarrhea, constipation, flank pain, dysuria, hematuria,  numbness, tingling, seizures,  Focal weakness, Loss of consciousness,  Tremor, insomnia,, anxiety, and suicidal ideation.     Objective:  BP 154/70 mmHg  Pulse 59  Temp(Src) 97.6 F (36.4 C) (Oral)  Resp 14  Ht 5\' 6"  (1.676 m)  Wt  183 lb 8 oz (83.235 kg)  BMI 29.63 kg/m2  SpO2 98%  General appearance: alert, cooperative and appears stated age Head: Normocephalic, without obvious abnormality, atraumatic Eyes: conjunctivae/corneas clear. PERRL, EOM's intact. Fundi benign. Ears: normal TM's and external ear canals both ears Nose: Nares normal. Septum midline. Mucosa normal. No drainage or sinus tenderness. Throat: lips, mucosa, and tongue normal; teeth and gums normal Neck: no adenopathy, no carotid bruit, no JVD, supple, symmetrical, trachea midline and thyroid not enlarged, symmetric, no tenderness/mass/nodules Lungs: clear to auscultation bilaterally Breasts: normal appearance, no masses or tenderness Heart: regular rate and rhythm, S1, S2 normal, no murmur, click, rub or gallop Abdomen: soft, non-tender; bowel sounds normal; no masses,  no organomegaly Extremities: extremities normal, atraumatic, no cyanosis or edema MSK: left knee larger than right, mild effusion ,  Tender in medial hollow, Pulses: 2+ and symmetric Skin: Skin color, texture, turgor normal. No rashes or lesions Neurologic: Alert and oriented X 3, normal strength and tone. Normal symmetric reflexes. Normal coordination and gait.   Assessment and Plan:  Problem List Items Addressed This Visit    Nocturia more than twice per night    UA is abnormal, but since symptoms are chronic will not treat unless urine culture is positive as well,  Given history of c dificile colitis .       Relevant Orders   Urinalysis, Routine w reflex microscopic (Completed)   Medicare annual wellness visit, subsequent - Primary    Annual Medicare wellness  exam was done as well as a comprehensive physical exam and management of acute and chronic conditions .  During the course of the visit the patient was educated and counseled about appropriate screening and preventive services including : fall prevention , diabetes screening, nutrition counseling, colorectal cancer  screening, and recommended immunizations.  Printed recommendations for health maintenance screenings was given.       Medial knee pain    Her medial knee pain and swelling have persisted for months.  I suspect she has a torn MCL.  MRI ordered.       Hypovitaminosis D    Improved to 29 ,  Recommended use of D3 2000 IUs daily      Hypertension    Elevated today due to lapse in medication. Home readings have been consistently < 140/80.  Renal functioN IS STABLE NO CHANGE TODAY.  Lab Results  Component Value Date   CREATININE 0.90 09/18/2014  , Lab Results  Component Value Date   NA 145 09/18/2014   K 4.5 09/18/2014   CL 110 09/18/2014   CO2 26 09/18/2014         Hyperlipidemia    LDL and triglycerides are at goal on current medications. She  has no side effects and liver enzymes are normal. No changes today.  Lab Results  Component Value Date   CHOL 176 09/18/2014   HDL 47.50 09/18/2014   LDLCALC 70 02/17/2014   LDLDIRECT 106.0 09/18/2014   TRIG 207.0* 09/18/2014   CHOLHDL 4 09/18/2014   Lab Results  Component Value Date  ALT 20 09/18/2014   AST 21 09/18/2014   ALKPHOS 63 09/18/2014   BILITOT 1.1 09/18/2014         Relevant Orders   Lipid panel (Completed)   LDL cholesterol, direct (Completed)   Enteritis due to Clostridium difficile    Resolved after treatment with oral vancomycin,  Continue lifelong probiotic.  Avoid unnecessary use of antibiotics.       Chronic insomnia    She is averaging < 5 hours per night despite trial of otc medications.  Trial of alprazolam. If not helpful will give trial of Requip given history of restless legs by priro sleep study.   The risks and benefits of benzodiazepine use were discussed with patient today including excessive sedation leading to respiratory depression,  impaired thinking/driving, and addiction.  Patient was advised to avoid concurrent use with alcohol, to use medication only as needed and not to share with  others  .         Other Visit Diagnoses    Need for hepatitis C screening test        Relevant Orders    Hepatitis C antibody (Completed)    Breast cancer screening        Relevant Orders    MM DIGITAL SCREENING BILATERAL    Other fatigue        Relevant Orders    TSH (Completed)    CBC with Differential/Platelet (Completed)    Comprehensive metabolic panel (Completed)    Vitamin D deficiency        Relevant Orders    Vit D  25 hydroxy (rtn osteoporosis monitoring) (Completed)    Overactive bladder        Relevant Orders    Urinalysis, Routine w reflex microscopic    Knee pain, chronic, left        Relevant Orders    MR Knee Left  Wo Contrast

## 2014-09-19 LAB — HEPATITIS C ANTIBODY: HCV Ab: NEGATIVE

## 2014-09-20 DIAGNOSIS — R351 Nocturia: Secondary | ICD-10-CM | POA: Insufficient documentation

## 2014-09-20 DIAGNOSIS — M1712 Unilateral primary osteoarthritis, left knee: Secondary | ICD-10-CM | POA: Insufficient documentation

## 2014-09-20 NOTE — Assessment & Plan Note (Signed)
LDL and triglycerides are at goal on current medications. She  has no side effects and liver enzymes are normal. No changes today.  Lab Results  Component Value Date   CHOL 176 09/18/2014   HDL 47.50 09/18/2014   LDLCALC 70 02/17/2014   LDLDIRECT 106.0 09/18/2014   TRIG 207.0* 09/18/2014   CHOLHDL 4 09/18/2014   Lab Results  Component Value Date   ALT 20 09/18/2014   AST 21 09/18/2014   ALKPHOS 63 09/18/2014   BILITOT 1.1 09/18/2014

## 2014-09-20 NOTE — Assessment & Plan Note (Signed)
Resolved after treatment with oral vancomycin,  Continue lifelong probiotic.  Avoid unnecessary use of antibiotics.

## 2014-09-20 NOTE — Assessment & Plan Note (Signed)
UA is abnormal, but since symptoms are chronic will not treat unless urine culture is positive as well,  Given history of c dificile colitis .

## 2014-09-20 NOTE — Assessment & Plan Note (Signed)

## 2014-09-20 NOTE — Assessment & Plan Note (Addendum)
She is averaging < 5 hours per night despite trial of otc medications.  Trial of alprazolam. If not helpful will give trial of Requip given history of restless legs by priro sleep study.   The risks and benefits of benzodiazepine use were discussed with patient today including excessive sedation leading to respiratory depression,  impaired thinking/driving, and addiction.  Patient was advised to avoid concurrent use with alcohol, to use medication only as needed and not to share with others  .

## 2014-09-20 NOTE — Assessment & Plan Note (Signed)
Improved to 29 ,  Recommended use of D3 2000 IUs daily

## 2014-09-20 NOTE — Assessment & Plan Note (Signed)
Elevated today due to lapse in medication. Home readings have been consistently < 140/80.  Renal functioN IS STABLE NO CHANGE TODAY.  Lab Results  Component Value Date   CREATININE 0.90 09/18/2014  , Lab Results  Component Value Date   NA 145 09/18/2014   K 4.5 09/18/2014   CL 110 09/18/2014   CO2 26 09/18/2014

## 2014-09-20 NOTE — Addendum Note (Signed)
Addended by: Crecencio Mc on: 09/20/2014 06:53 PM   Modules accepted: Orders

## 2014-09-20 NOTE — Assessment & Plan Note (Signed)
Her medial knee pain and swelling have persisted for months.  I suspect she has a torn MCL.  MRI ordered.

## 2014-09-22 ENCOUNTER — Other Ambulatory Visit: Payer: Medicare Other

## 2014-09-22 DIAGNOSIS — Z139 Encounter for screening, unspecified: Secondary | ICD-10-CM

## 2014-09-22 DIAGNOSIS — Z1211 Encounter for screening for malignant neoplasm of colon: Secondary | ICD-10-CM

## 2014-09-25 LAB — CULTURE, URINE COMPREHENSIVE: Colony Count: 8000

## 2014-09-29 ENCOUNTER — Ambulatory Visit: Payer: Self-pay | Admitting: Internal Medicine

## 2014-11-17 ENCOUNTER — Telehealth: Payer: Self-pay | Admitting: *Deleted

## 2014-11-17 MED ORDER — LISINOPRIL 30 MG PO TABS: 30.0000 mg | ORAL_TABLET | Freq: Every day | ORAL | Status: DC

## 2014-11-17 MED ORDER — SIMVASTATIN 40 MG PO TABS: ORAL_TABLET | ORAL | Status: DC

## 2014-11-17 MED ORDER — SOLIFENACIN SUCCINATE 5 MG PO TABS: 5.0000 mg | ORAL_TABLET | Freq: Every day | ORAL | Status: DC

## 2014-11-17 NOTE — Telephone Encounter (Signed)
Pt phoned in, needing refills. Rx sent to pharmacy by escript

## 2015-03-19 ENCOUNTER — Other Ambulatory Visit: Payer: Self-pay | Admitting: Internal Medicine

## 2015-03-19 NOTE — Telephone Encounter (Signed)
Ok to fill 

## 2015-03-22 NOTE — Telephone Encounter (Signed)
Ok to refill,  Refill sent  

## 2015-05-26 ENCOUNTER — Ambulatory Visit (INDEPENDENT_AMBULATORY_CARE_PROVIDER_SITE_OTHER): Payer: Medicare Other | Admitting: Internal Medicine

## 2015-05-26 ENCOUNTER — Encounter: Payer: Self-pay | Admitting: Internal Medicine

## 2015-05-26 VITALS — BP 130/74 | HR 57 | Temp 98.1°F | Resp 12 | Ht 66.0 in | Wt 182.8 lb

## 2015-05-26 DIAGNOSIS — H6591 Unspecified nonsuppurative otitis media, right ear: Secondary | ICD-10-CM

## 2015-05-26 DIAGNOSIS — Z23 Encounter for immunization: Secondary | ICD-10-CM

## 2015-05-26 DIAGNOSIS — E559 Vitamin D deficiency, unspecified: Secondary | ICD-10-CM

## 2015-05-26 DIAGNOSIS — H7291 Unspecified perforation of tympanic membrane, right ear: Secondary | ICD-10-CM

## 2015-05-26 DIAGNOSIS — I10 Essential (primary) hypertension: Secondary | ICD-10-CM | POA: Diagnosis not present

## 2015-05-26 DIAGNOSIS — E785 Hyperlipidemia, unspecified: Secondary | ICD-10-CM

## 2015-05-26 DIAGNOSIS — G4700 Insomnia, unspecified: Secondary | ICD-10-CM

## 2015-05-26 DIAGNOSIS — F5104 Psychophysiologic insomnia: Secondary | ICD-10-CM

## 2015-05-26 DIAGNOSIS — F324 Major depressive disorder, single episode, in partial remission: Secondary | ICD-10-CM

## 2015-05-26 DIAGNOSIS — Z0000 Encounter for general adult medical examination without abnormal findings: Secondary | ICD-10-CM

## 2015-05-26 DIAGNOSIS — H902 Conductive hearing loss, unspecified: Secondary | ICD-10-CM

## 2015-05-26 DIAGNOSIS — M1712 Unilateral primary osteoarthritis, left knee: Secondary | ICD-10-CM

## 2015-05-26 NOTE — Patient Instructions (Signed)
Referral to ENT  To evaluate your right ear  Call on Monday if you have not hear from [place] abut the knee MRI

## 2015-05-26 NOTE — Progress Notes (Signed)
Subjective:  Patient ID: Katherine Hudson, female    DOB: Dec 17, 1945  Age: 69 y.o. MRN: [id number]  CC: The primary encounter diagnosis was Essential hypertension. Diagnoses of Hyperlipidemia, Hypovitaminosis D, Otitis media, serous, TM rupture, right, Encounter for immunization, Need for prophylactic vaccination against Streptococcus pneumoniae (pneumococcus), Medicare annual wellness visit, subsequent, Primary osteoarthritis of left knee, Major depressive disorder with single episode, in partial remission ([place]), Chronic insomnia, and Tympanic membrane conductive hearing loss were also pertinent to this visit.  HPI Katherine Hudson presents for follow up on chronic issues including hypertension , knee pain , hyperlipidemi,a and  insomnia. Last seen April 2016.    Had a ruptured TM on the right in June/July  due to untreated sinusitis/otitis due to patient preference to avoid  Antibiotics due to history of recurrent  c dificile colitis .  Finally ended up up being treated with topical abx and oral antibiotics but not before she had  A TM rupture.   She had  no recurrence of c dificile. Has loss of hearing and recurrent 'plugged" feeling in right ear.  Has not seen ENT  Recurrent c dificile colitis; she was referred to Dr Vira Agar during recurrent episode, and he concurred with use of vancomycin in August 2015.  She has had no recurrence and continues to take a daily probiotic    She reports persistent pain on the medial side of her left knee. Which started in 2015 after a twisting event   MRI was done in n February at [facility] but the report was  never received,  Pain ,  Has to use a cane ,  Worse after sitting .   Needs refills on meds  Outpatient Prescriptions Prior to Visit  Medication Sig Dispense Refill  . aspirin 81 MG tablet Take 81 mg by mouth daily.      . Biotin 1000 MCG tablet Take 1,000 mcg by mouth 3 (three) times daily.    . Cinnamon 500 MG capsule Take 500 mg by mouth daily.    Marland Kitchen  co-enzyme Q-10 30 MG capsule Take 30 mg by mouth 3 (three) times daily.    . Cranberry 1000 MG CAPS Take 1 capsule by mouth daily.    . fluticasone (FLONASE) 50 MCG/ACT nasal spray Place 2 sprays into the nose daily as needed. 16 g 5  . Glucosamine-Chondroit-Vit C-Mn (GLUCOSAMINE CHONDR 1500 COMPLX PO) Take by mouth 2 (two) times daily.      . hydrochlorothiazide (HYDRODIURIL) 25 MG tablet Take 1 tablet (25 mg total) by mouth daily. 90 tablet 3  . ibuprofen (ADVIL,MOTRIN) 200 MG tablet Take 200 mg by mouth as needed.      Marland Kitchen KRILL OIL PO Take by mouth daily.    Marland Kitchen lisinopril (PRINIVIL,ZESTRIL) 30 MG tablet Take 1 tablet (30 mg total) by mouth daily. 90 tablet 1  . loratadine (CLARITIN) 10 MG tablet Take 10 mg by mouth daily.    . Multiple Vitamin (MULTIVITAMIN) tablet Take 1 tablet by mouth daily.      . sertraline (ZOLOFT) 50 MG tablet TAKE THREE TABLETS BY MOUTH ONCE DAILY 270 tablet 0  . simvastatin (ZOCOR) 40 MG tablet TAKE ONE TABLET BY MOUTH AT BEDTIME 90 tablet 1  . solifenacin (VESICARE) 5 MG tablet Take 1 tablet (5 mg total) by mouth daily. 90 tablet 1  . ALPRAZolam (XANAX) 0.5 MG tablet Take 1 tablet (0.5 mg total) by mouth at bedtime as needed for anxiety. (Patient not taking: Reported on  05/26/2015) 30 tablet 3  . TDaP (BOOSTRIX) 5-2.5-18.5 LF-MCG/0.5 injection Inject 0.5 mLs into the muscle once. 0.5 mL 0  . zoster vaccine live, PF, (ZOSTAVAX) 66294 UNT/0.65ML injection Inject 19,400 Units into the skin once. 1 each 0   No facility-administered medications prior to visit.    Review of Systems;  Patient denies headache, fevers, malaise, unintentional weight loss, skin rash, eye pain, sinus congestion and sinus pain, sore throat, dysphagia,  hemoptysis , cough, dyspnea, wheezing, chest pain, palpitations, orthopnea, edema, abdominal pain, nausea, melena, diarrhea, constipation, flank pain, dysuria, hematuria, urinary  Frequency, nocturia, numbness, tingling, seizures,  Focal weakness,  Loss of consciousness,  Tremor, insomnia, depression, anxiety, and suicidal ideation.      Objective:  BP 130/74 mmHg  Pulse 57  Temp(Src) 98.1 F (36.7 C) (Oral)  Resp 12  Ht 5\' 6"  (1.676 m)  Wt 182 lb 12 oz (82.895 kg)  BMI 29.51 kg/m2  SpO2 96%  BP Readings from Last 3 Encounters:  05/26/15 130/74  09/18/14 154/70  02/18/14 110/60    Wt Readings from Last 3 Encounters:  05/26/15 182 lb 12 oz (82.895 kg)  09/18/14 183 lb 8 oz (83.235 kg)  02/18/14 168 lb 4 oz (76.318 kg)    General appearance: alert, cooperative and appears stated age Ears: normal TM's and external ear canals both ears Throat: lips, mucosa, and tongue normal; teeth and gums normal Neck: no adenopathy, no carotid bruit, supple, symmetrical, trachea midline and thyroid not enlarged, symmetric, no tenderness/mass/nodules Back: symmetric, no curvature. ROM normal. No CVA tenderness. Lungs: clear to auscultation bilaterally Heart: regular rate and rhythm, S1, S2 normal, no murmur, click, rub or gallop Abdomen: soft, non-tender; bowel sounds normal; no masses,  no organomegaly Pulses: 2+ and symmetric Skin: Skin color, texture, turgor normal. No rashes or lesions Lymph nodes: Cervical, supraclavicular, and axillary nodes normal.  No results found for: HGBA1C  Lab Results  Component Value Date   CREATININE 1.00 05/26/2015   CREATININE 0.90 09/18/2014   CREATININE 0.8 02/17/2014    Lab Results  Component Value Date   WBC 7.1 09/18/2014   HGB 15.7* 09/18/2014   HCT 45.9 09/18/2014   PLT 184.0 09/18/2014   GLUCOSE 106* 05/26/2015   CHOL 150 05/26/2015   TRIG 178.0* 05/26/2015   HDL 43.80 05/26/2015   LDLDIRECT 86.0 05/26/2015   LDLCALC 71 05/26/2015   ALT 18 05/26/2015   AST 21 05/26/2015   NA 142 05/26/2015   K 4.5 05/26/2015   CL 106 05/26/2015   CREATININE 1.00 05/26/2015   BUN 25* 05/26/2015   CO2 24 05/26/2015   TSH 2.15 09/18/2014    No results found.  Assessment & Plan:    Problem List Items Addressed This Visit    Hyperlipidemia    LDL and triglycerides are at goal on current medications. She  has no side effects and liver enzymes are normal. No changes today.  Lab Results  Component Value Date   CHOL 150 05/26/2015   HDL 43.80 05/26/2015   LDLCALC 71 05/26/2015   LDLDIRECT 86.0 05/26/2015   TRIG 178.0* 05/26/2015   CHOLHDL 3 05/26/2015   Lab Results  Component Value Date   ALT 18 05/26/2015   AST 21 05/26/2015   ALKPHOS 53 05/26/2015   BILITOT 0.8 05/26/2015           Relevant Orders   LDL cholesterol, direct (Completed)   Lipid panel (Completed)   Hypertension - Primary    Well controlled on  current regimen. Renal function stable, no changes today.  Lab Results  Component Value Date   CREATININE 1.00 05/26/2015   Lab Results  Component Value Date   NA 142 05/26/2015   K 4.5 05/26/2015   CL 106 05/26/2015   CO2 24 05/26/2015         Relevant Orders   Comprehensive metabolic panel (Completed)   Major depressive disorder with single episode, in partial remission ([place])    Symptoms are stable on current therapy. No changes today.        Chronic insomnia    Complicated by RLS.  Now managed with rial of Requip given history of restless legs by prior seep study.   She is not using alprazolam previously prescribed.          RESOLVED: Medicare annual wellness visit, subsequent           Left knee DJD    Tricompartmental, with loss of joint space more pronounced on medial side consistent with exam and history of pain . I an still waiting for results of MRI knee which was done in February 2016 but never received.  Patient  has been travelling cross country since then. Referral to Orthopedics advised,       Tympanic membrane conductive hearing loss    Secondary to ruptured TM on left during epsiodes of untreated otitis media.  Referral to ENt for evaluation       Hypovitaminosis D   Relevant Orders   Vit D  25  hydroxy (rtn osteoporosis monitoring) (Completed)    Other Visit Diagnoses    Otitis media, serous, TM rupture, right        Relevant Orders    Ambulatory referral to ENT    Encounter for immunization        Need for prophylactic vaccination against Streptococcus pneumoniae (pneumococcus)        Relevant Orders    Pneumococcal polysaccharide vaccine 23-valent greater than or equal to 2yo subcutaneous/IM (Completed)       I have discontinued Ms. Plouffe's Tdap and zoster vaccine live (PF). I am also having her maintain her Glucosamine-Chondroit-Vit C-Mn (GLUCOSAMINE CHONDR 1500 COMPLX PO), multivitamin, aspirin, ibuprofen, KRILL OIL PO, loratadine, co-enzyme Q-10, fluticasone, hydrochlorothiazide, Biotin, Cranberry, Cinnamon, ALPRAZolam, simvastatin, lisinopril, solifenacin, and sertraline.  No orders of the defined types were placed in this encounter.    Medications Discontinued During This Encounter  Medication Reason  . zoster vaccine live, PF, (ZOSTAVAX) [id number] UNT/0.65ML injection Error  . TDaP (BOOSTRIX) 5-2.5-18.5 LF-MCG/0.5 injection Error    Follow-up: No Follow-up on file.   Crecencio Mc, MD

## 2015-05-26 NOTE — Progress Notes (Signed)
Pre-visit discussion using our clinic review tool. No additional management support is needed unless otherwise documented below in the visit note.  

## 2015-05-27 LAB — LIPID PANEL
Cholesterol: 150 mg/dL (ref 0–200)
HDL: 43.8 mg/dL (ref >39.00)
LDL CALC: 71 mg/dL (ref 0–99)
NONHDL: 106.5
Total CHOL/HDL Ratio: 3
Triglycerides: 178 mg/dL — ABNORMAL HIGH (ref 0.0–149.0)
VLDL: 35.6 mg/dL (ref 0.0–40.0)

## 2015-05-27 LAB — COMPREHENSIVE METABOLIC PANEL
ALT: 18 U/L (ref 0–35)
AST: 21 U/L (ref 0–37)
Albumin: 4.6 g/dL (ref 3.5–5.2)
Alkaline Phosphatase: 53 U/L (ref 39–117)
BUN: 25 mg/dL — AB (ref 6–23)
CHLORIDE: 106 meq/L (ref 96–112)
CO2: 24 meq/L (ref 19–32)
Calcium: 10 mg/dL (ref 8.4–10.5)
Creatinine, Ser: 1 mg/dL (ref 0.40–1.20)
GFR: 58.36 mL/min — ABNORMAL LOW (ref >60.00)
GLUCOSE: 106 mg/dL — AB (ref 70–99)
POTASSIUM: 4.5 meq/L (ref 3.5–5.1)
SODIUM: 142 meq/L (ref 135–145)
Total Bilirubin: 0.8 mg/dL (ref 0.2–1.2)
Total Protein: 7.6 g/dL (ref 6.0–8.3)

## 2015-05-27 LAB — VITAMIN D 25 HYDROXY (VIT D DEFICIENCY, FRACTURES): VITD: 35.05 ng/mL (ref 30.00–100.00)

## 2015-05-27 LAB — LDL CHOLESTEROL, DIRECT: Direct LDL: 86 mg/dL

## 2015-05-28 ENCOUNTER — Encounter: Payer: Self-pay | Admitting: *Deleted

## 2015-05-29 DIAGNOSIS — H902 Conductive hearing loss, unspecified: Secondary | ICD-10-CM | POA: Insufficient documentation

## 2015-05-29 NOTE — Assessment & Plan Note (Signed)
Symptoms are stable on current therapy. No changes today.

## 2015-05-29 NOTE — Assessment & Plan Note (Signed)
LDL and triglycerides are at goal on current medications. She  has no side effects and liver enzymes are normal. No changes today.  Lab Results  Component Value Date   CHOL 150 05/26/2015   HDL 43.80 05/26/2015   LDLCALC 71 05/26/2015   LDLDIRECT 86.0 05/26/2015   TRIG 178.0* 05/26/2015   CHOLHDL 3 05/26/2015   Lab Results  Component Value Date   ALT 18 05/26/2015   AST 21 05/26/2015   ALKPHOS 53 05/26/2015   BILITOT 0.8 05/26/2015

## 2015-05-29 NOTE — Assessment & Plan Note (Signed)
Tricompartmental, with loss of joint space more pronounced on medial side consistent with exam and history of pain . I an still waiting for results of MRI knee which was done in February 2016 but never received.  Patient  has been travelling cross country since then. Referral to Orthopedics advised,

## 2015-05-29 NOTE — Assessment & Plan Note (Signed)
Secondary to ruptured TM on left during epsiodes of untreated otitis media.  Referral to ENt for evaluation

## 2015-05-29 NOTE — Assessment & Plan Note (Signed)
Well controlled on current regimen. Renal function stable, no changes today.  Lab Results  Component Value Date   CREATININE 1.00 05/26/2015   Lab Results  Component Value Date   NA 142 05/26/2015   K 4.5 05/26/2015   CL 106 05/26/2015   CO2 24 05/26/2015

## 2015-05-29 NOTE — Assessment & Plan Note (Signed)
Complicated by RLS.  Now managed with rial of Requip given history of restless legs by prior seep study.   She is not using alprazolam previously prescribed.

## 2015-05-31 ENCOUNTER — Telehealth: Payer: Self-pay | Admitting: Internal Medicine

## 2015-05-31 NOTE — Telephone Encounter (Signed)
[place] stated patient has not had a MRI of left knee. There is no records.

## 2015-05-31 NOTE — Telephone Encounter (Signed)
Please inform patient that MRI was NOT DONE ,  She told me it was done  And that we neer told her the results.

## 2015-06-01 NOTE — Telephone Encounter (Signed)
Left message for patient to return call to office. 

## 2015-06-02 NOTE — Telephone Encounter (Signed)
Voicemail is full and mobile number has changed or disconnected.

## 2015-06-02 NOTE — Telephone Encounter (Signed)
Left message on patient voice mail to call office 

## 2015-06-04 ENCOUNTER — Other Ambulatory Visit: Payer: Self-pay

## 2015-06-04 ENCOUNTER — Telehealth: Payer: Self-pay | Admitting: Internal Medicine

## 2015-06-04 MED ORDER — SIMVASTATIN 40 MG PO TABS: 40.0000 mg | ORAL_TABLET | Freq: Every day | ORAL | Status: DC

## 2015-06-04 MED ORDER — SERTRALINE HCL 50 MG PO TABS: ORAL_TABLET | ORAL | Status: DC

## 2015-06-04 MED ORDER — SOLIFENACIN SUCCINATE 5 MG PO TABS: 5.0000 mg | ORAL_TABLET | Freq: Every day | ORAL | Status: DC

## 2015-06-04 MED ORDER — LISINOPRIL 30 MG PO TABS
30.0000 mg | ORAL_TABLET | Freq: Every day | ORAL | Status: DC
Start: 2015-06-04 — End: 2015-11-27

## 2015-06-04 NOTE — Telephone Encounter (Signed)
Patient came in for prescriptions, Awaiting Dr. Lupita Dawn signature.

## 2015-06-04 NOTE — Telephone Encounter (Signed)
Pt called stating she is out of her lisinopril (PRINIVIL,ZESTRIL) 30 MG tablet and simvastatin (ZOCOR) 40 MG tablet need more refills. Pt has been out of her BP medication. Pharmacy is [facility]!

## 2015-06-08 ENCOUNTER — Telehealth: Payer: Self-pay | Admitting: *Deleted

## 2015-06-08 NOTE — Telephone Encounter (Signed)
Left message for patient to return cal to office, after several attempts to reach patient by phone letter has been mailed.

## 2015-06-08 NOTE — Telephone Encounter (Signed)
Patient requested a call back, regarding her MRI results.

## 2015-06-08 NOTE — Telephone Encounter (Signed)
Spoke with the patient, she states she had the procedure done and that she received a bill for it.  i explained that we contacted medical records at [facility] and that there is no record of her having the MRI done.  I told her to look for her bill so that we can see what has happened.

## 2015-06-11 ENCOUNTER — Telehealth: Payer: Self-pay | Admitting: Internal Medicine

## 2015-06-11 DIAGNOSIS — M1712 Unilateral primary osteoarthritis, left knee: Secondary | ICD-10-CM

## 2015-06-11 NOTE — Telephone Encounter (Signed)
Notified pt, Pt is requested referral with Dr Marry Guan

## 2015-06-11 NOTE — Telephone Encounter (Signed)
MRI report from [place] received.  She is tri compartmental osteoarthritis,  Advanced. With bone spurs in all three compartments as well as an old medial meniscus tear .  I suggest she let me refer her to Orthopedics.  Does she have a preference of physician or city?  I  recommend Dr Marry Guan in [place]   Or Dr Ricki Rodriguez in [place]

## 2015-06-12 NOTE — Telephone Encounter (Signed)
Your referral is in process as requested. Our referral coordinator will call you when the appointment has been made.  

## 2015-09-20 ENCOUNTER — Other Ambulatory Visit: Payer: Self-pay | Admitting: Internal Medicine

## 2015-11-27 ENCOUNTER — Other Ambulatory Visit: Payer: Self-pay | Admitting: Internal Medicine

## 2016-01-04 ENCOUNTER — Telehealth: Payer: Self-pay | Admitting: *Deleted

## 2016-01-04 NOTE — Telephone Encounter (Signed)
[facility] in [place] has requested a medication refill fill for lisinopril and zoloft.  [phone]

## 2016-01-04 NOTE — Telephone Encounter (Signed)
Called in Lisinopril 30mg . gd #30 and Zoloft 50mg . 3qd #90 to [facility] in [place], [phone].

## 2016-03-01 ENCOUNTER — Telehealth: Payer: Self-pay | Admitting: Internal Medicine

## 2016-03-01 MED ORDER — SIMVASTATIN 40 MG PO TABS: 40.0000 mg | ORAL_TABLET | Freq: Every day | ORAL | 0 refills | Status: DC

## 2016-03-01 MED ORDER — LISINOPRIL 30 MG PO TABS: 30.0000 mg | ORAL_TABLET | Freq: Every day | ORAL | 0 refills | Status: DC

## 2016-03-01 MED ORDER — SERTRALINE HCL 50 MG PO TABS: ORAL_TABLET | ORAL | 1 refills | Status: DC

## 2016-03-01 NOTE — Telephone Encounter (Signed)
Pt needs refill on the following sent to [address]  lisinopril (PRINIVIL,ZESTRIL) 30 MG tablet simvastatin (ZOCOR) 40 MG tablet sertraline (ZOLOFT) 50 MG tablet

## 2016-03-01 NOTE — Telephone Encounter (Signed)
Refilled, thanks

## 2016-03-06 ENCOUNTER — Encounter (INDEPENDENT_AMBULATORY_CARE_PROVIDER_SITE_OTHER): Payer: Self-pay

## 2016-03-06 ENCOUNTER — Ambulatory Visit (INDEPENDENT_AMBULATORY_CARE_PROVIDER_SITE_OTHER): Payer: Medicare Other | Admitting: Internal Medicine

## 2016-03-06 VITALS — BP 144/76 | HR 60 | Temp 98.0°F | Resp 15 | Wt 180.8 lb

## 2016-03-06 DIAGNOSIS — R7303 Prediabetes: Secondary | ICD-10-CM | POA: Diagnosis not present

## 2016-03-06 DIAGNOSIS — A09 Infectious gastroenteritis and colitis, unspecified: Secondary | ICD-10-CM | POA: Diagnosis not present

## 2016-03-06 DIAGNOSIS — G2581 Restless legs syndrome: Secondary | ICD-10-CM | POA: Diagnosis not present

## 2016-03-06 DIAGNOSIS — E785 Hyperlipidemia, unspecified: Secondary | ICD-10-CM

## 2016-03-06 DIAGNOSIS — E034 Atrophy of thyroid (acquired): Secondary | ICD-10-CM | POA: Diagnosis not present

## 2016-03-06 DIAGNOSIS — A047 Enterocolitis due to Clostridium difficile: Secondary | ICD-10-CM

## 2016-03-06 DIAGNOSIS — E559 Vitamin D deficiency, unspecified: Secondary | ICD-10-CM

## 2016-03-06 DIAGNOSIS — I10 Essential (primary) hypertension: Secondary | ICD-10-CM

## 2016-03-06 DIAGNOSIS — K648 Other hemorrhoids: Secondary | ICD-10-CM

## 2016-03-06 DIAGNOSIS — K644 Residual hemorrhoidal skin tags: Secondary | ICD-10-CM

## 2016-03-06 DIAGNOSIS — H109 Unspecified conjunctivitis: Secondary | ICD-10-CM

## 2016-03-06 DIAGNOSIS — E038 Other specified hypothyroidism: Secondary | ICD-10-CM | POA: Diagnosis not present

## 2016-03-06 DIAGNOSIS — A0472 Enterocolitis due to Clostridium difficile, not specified as recurrent: Secondary | ICD-10-CM

## 2016-03-06 MED ORDER — PRAMOXINE HCL 1 % RE FOAM: 1.0000 "application " | Freq: Three times a day (TID) | RECTAL | 0 refills | Status: DC | PRN

## 2016-03-06 MED ORDER — ERYTHROMYCIN 5 MG/GM OP OINT: 1.0000 "application " | TOPICAL_OINTMENT | Freq: Every day | OPHTHALMIC | 0 refills | Status: DC

## 2016-03-06 MED ORDER — DOXYCYCLINE HYCLATE 100 MG PO CAPS: 100.0000 mg | ORAL_CAPSULE | Freq: Two times a day (BID) | ORAL | 0 refills | Status: DC

## 2016-03-06 NOTE — Patient Instructions (Addendum)
Please bring back a stool sample so we can test you for c dif  You can use pramoxine or Nupercainal up to 5 times daily  For your hemorrhoidal irritation   If your C dif test is negative,  I will recommend using immodium with each meal.    For your left eye:  Erythromycin ointment daily at bedtime for one week

## 2016-03-06 NOTE — Progress Notes (Signed)
Pre visit review using our clinic review tool, if applicable. No additional management support is needed unless otherwise documented below in the visit note. 
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
## 2016-03-07 DIAGNOSIS — H109 Unspecified conjunctivitis: Secondary | ICD-10-CM | POA: Insufficient documentation

## 2016-03-07 DIAGNOSIS — K591 Functional diarrhea: Secondary | ICD-10-CM

## 2016-03-07 DIAGNOSIS — E034 Atrophy of thyroid (acquired): Secondary | ICD-10-CM

## 2016-03-07 DIAGNOSIS — K644 Residual hemorrhoidal skin tags: Secondary | ICD-10-CM | POA: Insufficient documentation

## 2016-03-07 DIAGNOSIS — R7303 Prediabetes: Secondary | ICD-10-CM | POA: Insufficient documentation

## 2016-03-07 DIAGNOSIS — K589 Irritable bowel syndrome without diarrhea: Secondary | ICD-10-CM | POA: Insufficient documentation

## 2016-03-07 HISTORY — DX: Atrophy of thyroid (acquired): E03.4

## 2016-03-07 LAB — COMPREHENSIVE METABOLIC PANEL
ALT: 17 U/L (ref 0–35)
AST: 20 U/L (ref 0–37)
Albumin: 4.6 g/dL (ref 3.5–5.2)
Alkaline Phosphatase: 54 U/L (ref 39–117)
BUN: 19 mg/dL (ref 6–23)
CHLORIDE: 105 meq/L (ref 96–112)
CO2: 25 meq/L (ref 19–32)
CREATININE: 1.03 mg/dL (ref 0.40–1.20)
Calcium: 10.2 mg/dL (ref 8.4–10.5)
GFR: 56.27 mL/min — AB (ref >60.00)
GLUCOSE: 100 mg/dL — AB (ref 70–99)
Potassium: 4.8 mEq/L (ref 3.5–5.1)
Sodium: 141 mEq/L (ref 135–145)
Total Bilirubin: 0.9 mg/dL (ref 0.2–1.2)
Total Protein: 7.5 g/dL (ref 6.0–8.3)

## 2016-03-07 LAB — LIPID PANEL
CHOL/HDL RATIO: 4
Cholesterol: 153 mg/dL (ref 0–200)
HDL: 41.6 mg/dL (ref >39.00)
LDL CALC: 75 mg/dL (ref 0–99)
NONHDL: 111.74
Triglycerides: 185 mg/dL — ABNORMAL HIGH (ref 0.0–149.0)
VLDL: 37 mg/dL (ref 0.0–40.0)

## 2016-03-07 LAB — CBC WITH DIFFERENTIAL/PLATELET
BASOS PCT: 0.8 % (ref 0.0–3.0)
Basophils Absolute: 0.1 10*3/uL (ref 0.0–0.1)
EOS PCT: 2.3 % (ref 0.0–5.0)
Eosinophils Absolute: 0.2 10*3/uL (ref 0.0–0.7)
HEMATOCRIT: 44.6 % (ref 36.0–46.0)
HEMOGLOBIN: 15.1 g/dL — AB (ref 12.0–15.0)
LYMPHS PCT: 22.4 % (ref 12.0–46.0)
Lymphs Abs: 1.5 10*3/uL (ref 0.7–4.0)
MCHC: 33.7 g/dL (ref 30.0–36.0)
MCV: 83.5 fl (ref 78.0–100.0)
Monocytes Absolute: 0.3 10*3/uL (ref 0.1–1.0)
Monocytes Relative: 4.2 % (ref 3.0–12.0)
NEUTROS ABS: 4.8 10*3/uL (ref 1.4–7.7)
Neutrophils Relative %: 70.3 % (ref 43.0–77.0)
PLATELETS: 202 10*3/uL (ref 150.0–400.0)
RBC: 5.34 Mil/uL — ABNORMAL HIGH (ref 3.87–5.11)
RDW: 14 % (ref 11.5–15.5)
WBC: 6.8 10*3/uL (ref 4.0–10.5)

## 2016-03-07 LAB — HEMOGLOBIN A1C: HEMOGLOBIN A1C: 5.9 % (ref 4.6–6.5)

## 2016-03-07 LAB — TSH: TSH: 1.63 u[IU]/mL (ref 0.35–4.50)

## 2016-03-07 LAB — C-REACTIVE PROTEIN: CRP: 0 mg/dL — ABNORMAL LOW (ref 0.5–20.0)

## 2016-03-07 LAB — SEDIMENTATION RATE: SED RATE: 14 mm/h (ref 0–30)

## 2016-03-07 NOTE — Assessment & Plan Note (Signed)
Periodic screening with a1c is advised.  No results found for: HGBA1C

## 2016-03-07 NOTE — Assessment & Plan Note (Signed)
T

## 2016-03-07 NOTE — Assessment & Plan Note (Signed)
Secondary to irritation from diarrhea.  Non thrombosed and not bleeding per exam today.  Trial of Nupercainal and Proctofoam both available OTC

## 2016-03-07 NOTE — Assessment & Plan Note (Signed)
LDL and triglycerides are at goal on current medications. She  has no side effects and liver enzymes are normal. No changes today.  Lab Results  Component Value Date   CHOL 150 05/26/2015   HDL 43.80 05/26/2015   LDLCALC 71 05/26/2015   LDLDIRECT 86.0 05/26/2015   TRIG 178.0 (H) 05/26/2015   CHOLHDL 3 05/26/2015   Lab Results  Component Value Date   ALT 18 05/26/2015   AST 21 05/26/2015   ALKPHOS 53 05/26/2015   BILITOT 0.8 05/26/2015

## 2016-03-07 NOTE — Assessment & Plan Note (Signed)
Elevated today due to lapse in lisinopril

## 2016-03-07 NOTE — Assessment & Plan Note (Signed)
Symptoms have persisted for months.  Topical antibiotic prescribed.

## 2016-03-07 NOTE — Assessment & Plan Note (Signed)
Repeat Tsh is overdue and pending   Lab Results  Component Value Date   TSH 2.15 09/18/2014

## 2016-03-07 NOTE — Assessment & Plan Note (Signed)
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  Treated with FMT by Dr Vira Agar in 2015 for first episode that failed to resolve with oral abx.  Recurrence in August 2015 was treated with oral vancomycin, patient states that she has had multiple loose stools daily since then but did not follow up with Dr Vira Agar as advised.  Reviewed phone messages and patient did not report loose stools to GI after last treatment.  Very frustrating course since lack of communication appears to have been the issue complicated by patient travelling with husband for half of the year (living in [place]). Patient sent home with container for stool collection and retesting for c dif.  If negative will treat for IBS.

## 2016-03-09 ENCOUNTER — Other Ambulatory Visit: Payer: Self-pay | Admitting: Internal Medicine

## 2016-03-09 DIAGNOSIS — A09 Infectious gastroenteritis and colitis, unspecified: Secondary | ICD-10-CM | POA: Diagnosis not present

## 2016-03-09 NOTE — Addendum Note (Signed)
Addended by: Frutoso Chase A on: 03/09/2016 01:53 PM   Modules accepted: Orders

## 2016-03-10 LAB — C. DIFFICILE GDH AND TOXIN A/B
C. DIFF TOXIN A/B: NOT DETECTED
C. DIFFICILE GDH: NOT DETECTED

## 2016-03-14 ENCOUNTER — Encounter: Payer: Self-pay | Admitting: *Deleted

## 2016-03-14 NOTE — Progress Notes (Signed)
Error

## 2016-05-29 ENCOUNTER — Other Ambulatory Visit: Payer: Self-pay | Admitting: Internal Medicine

## 2016-09-01 ENCOUNTER — Telehealth: Payer: Self-pay

## 2016-09-01 ENCOUNTER — Telehealth: Payer: Self-pay | Admitting: Internal Medicine

## 2016-09-01 MED ORDER — LISINOPRIL 30 MG PO TABS: 30.0000 mg | ORAL_TABLET | Freq: Every day | ORAL | 0 refills | Status: DC

## 2016-09-01 MED ORDER — SIMVASTATIN 40 MG PO TABS: 40.0000 mg | ORAL_TABLET | Freq: Every day | ORAL | 0 refills | Status: DC

## 2016-09-01 NOTE — Telephone Encounter (Signed)
RX lisinopril and simvastatin sent to pharmacy. Needs appt for further refills

## 2016-09-01 NOTE — Telephone Encounter (Signed)
Pt called and is requesting new rx's sent over for lisinopril (PRINIVIL,ZESTRIL) 30 MG tablet and simvastatin (ZOCOR) 40 MG tablet. Please advise, thank you!  [place], [place]  Call pt @ [phone]

## 2016-09-05 ENCOUNTER — Telehealth: Payer: Self-pay | Admitting: *Deleted

## 2016-09-05 NOTE — Telephone Encounter (Signed)
I called [facility] to cancel rxs that we sent on 09/01/16. Per pharmacist- she can contact pharmacy in [facility] where she is requesting to fill them and inform them that her rxs are on file here in [place] and they can see them on file in their system.

## 2016-09-05 NOTE — Telephone Encounter (Signed)
Pharmacy did not receive Rx  Fax [phone]  Pharmacy [facility] in [place]

## 2016-09-06 MED ORDER — LISINOPRIL 30 MG PO TABS: 30.0000 mg | ORAL_TABLET | Freq: Every day | ORAL | 0 refills | Status: DC

## 2016-09-06 MED ORDER — SIMVASTATIN 40 MG PO TABS: 40.0000 mg | ORAL_TABLET | Freq: Every day | ORAL | 0 refills | Status: DC

## 2016-09-06 NOTE — Addendum Note (Signed)
Addended by: Valere Dross on: 09/06/2016 10:30 AM   Modules accepted: Orders

## 2016-09-06 NOTE — Telephone Encounter (Signed)
Sent Lisinopril and Simvastatin to [facility] in [facility]

## 2016-11-13 ENCOUNTER — Other Ambulatory Visit: Payer: Self-pay | Admitting: Internal Medicine

## 2016-11-17 ENCOUNTER — Ambulatory Visit: Payer: Medicare Other | Admitting: Internal Medicine

## 2016-11-28 ENCOUNTER — Encounter: Payer: Self-pay | Admitting: Emergency Medicine

## 2016-11-28 ENCOUNTER — Emergency Department
Admission: EM | Admit: 2016-11-28 | Discharge: 2016-11-28 | Disposition: A | Discharge status: Home / Self Care | Payer: Medicare Other | Attending: Emergency Medicine | Admitting: Emergency Medicine

## 2016-11-28 ENCOUNTER — Telehealth: Payer: Self-pay | Admitting: Internal Medicine

## 2016-11-28 ENCOUNTER — Emergency Department: Payer: Medicare Other

## 2016-11-28 DIAGNOSIS — I10 Essential (primary) hypertension: Secondary | ICD-10-CM | POA: Insufficient documentation

## 2016-11-28 DIAGNOSIS — Z87891 Personal history of nicotine dependence: Secondary | ICD-10-CM | POA: Diagnosis not present

## 2016-11-28 DIAGNOSIS — Z79899 Other long term (current) drug therapy: Secondary | ICD-10-CM | POA: Insufficient documentation

## 2016-11-28 DIAGNOSIS — R55 Syncope and collapse: Secondary | ICD-10-CM | POA: Diagnosis not present

## 2016-11-28 DIAGNOSIS — R0789 Other chest pain: Secondary | ICD-10-CM | POA: Diagnosis not present

## 2016-11-28 DIAGNOSIS — R002 Palpitations: Secondary | ICD-10-CM | POA: Insufficient documentation

## 2016-11-28 DIAGNOSIS — R0602 Shortness of breath: Secondary | ICD-10-CM | POA: Diagnosis not present

## 2016-11-28 DIAGNOSIS — R079 Chest pain, unspecified: Secondary | ICD-10-CM | POA: Diagnosis not present

## 2016-11-28 LAB — CBC
HCT: 45.5 % (ref 35.0–47.0)
HEMOGLOBIN: 15.6 g/dL (ref 12.0–16.0)
MCH: 28.1 pg (ref 26.0–34.0)
MCHC: 34.3 g/dL (ref 32.0–36.0)
MCV: 81.7 fL (ref 80.0–100.0)
PLATELETS: 196 10*3/uL (ref 150–440)
RBC: 5.57 MIL/uL — ABNORMAL HIGH (ref 3.80–5.20)
RDW: 14.4 % (ref 11.5–14.5)
WBC: 8.5 10*3/uL (ref 3.6–11.0)

## 2016-11-28 LAB — BASIC METABOLIC PANEL
ANION GAP: 8 (ref 5–15)
BUN: 21 mg/dL — ABNORMAL HIGH (ref 6–20)
CALCIUM: 9.3 mg/dL (ref 8.9–10.3)
CO2: 23 mmol/L (ref 22–32)
CREATININE: 0.88 mg/dL (ref 0.44–1.00)
Chloride: 109 mmol/L (ref 101–111)
Glucose, Bld: 102 mg/dL — ABNORMAL HIGH (ref 65–99)
Potassium: 4.3 mmol/L (ref 3.5–5.1)
SODIUM: 140 mmol/L (ref 135–145)

## 2016-11-28 LAB — TROPONIN I

## 2016-11-28 NOTE — ED Triage Notes (Signed)
Pt c/o "clinching in her chest" for twelve days and some shortness of breath.

## 2016-11-28 NOTE — Telephone Encounter (Signed)
[facility]  Patient Name: Katherine Hudson  DOB: September 12, 1945    Initial Comment Caller states pt having irregular heartbeat, slight chest pain, and headache.   Nurse Assessment  Nurse: Harlow Mares, RN, Suanne Marker Date/Time (Eastern Time): 11/28/2016 3:19:31 PM  Confirm and document reason for call. If symptomatic, describe symptoms. ---Caller states pt having irregular heartbeat, slight chest pain, and headache. Reports that she has a squeezing in her chest with SOB.  Does the patient have any new or worsening symptoms? ---Yes  Will a triage be completed? ---Yes  Related visit to physician within the last 2 weeks? ---No  Does the PT have any chronic conditions? (i.e. diabetes, asthma, etc.) ---Yes  List chronic conditions. ---HTN;  Is this a behavioral health or substance abuse call? ---No     Guidelines    Guideline Title Affirmed Question Affirmed Notes  Chest Pain [1] Chest pain lasts > 5 minutes AND [2] age > 30 AND [3] at least one cardiac risk factor (i.e., hypertension, diabetes, obesity, smoker or strong family history of heart disease)    Final Disposition User   Call EMS 911 Now Harlow Mares, Therapist, sports, [facility] - ED   Disagree/Comply: Comply

## 2016-11-28 NOTE — ED Notes (Signed)
ED Provider at bedside. 

## 2016-11-28 NOTE — Telephone Encounter (Signed)
FYI

## 2016-11-28 NOTE — ED Provider Notes (Signed)
[facility] Emergency Department Provider Note  ____________________________________________   First MD Initiated Contact with Patient 11/28/16 1714     (approximate)  I have reviewed the triage vital signs and the nursing notes.   HISTORY  Chief Complaint Chest Pain   HPI Katherine Hudson is a 71 y.o. female with history of hypertension who is presenting to the emergency Department was 12 days of chest pain. She says the pain feels like a squeezing and is intermittent. She says it can come on at any time but feels that it is worsened with exertion. Also associated mild shortness of breath. Says that she also has sharp pains which only last a second as well and is not experiencing this at this time. The pain right now is very dull and mild into the center of the chest. Denies any radiation of the pain. Also concerned about palpitations over the past week. Says that she also fell last night but did not pass out. She believes that she may have been almost passing out which caused the fall. Possible palpitations at that time. Says the pain is over the center of the chest.  Patient denies any serious head trauma from a fall last night. Says that she has some lateral ankle pain but has been ambulatory. Denies any headache at this time.   Past Medical History:  Diagnosis Date  . Depression   . Hyperlipidemia   . Hypertension     Patient Active Problem List   Diagnosis Date Noted  . Diarrhea of infectious origin 03/07/2016  . Hypothyroidism due to acquired atrophy of thyroid 03/07/2016  . Prediabetes 03/07/2016  . Conjunctivitis of left eye 03/07/2016  . Inflamed external hemorrhoid 03/07/2016  . 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 Nocturia more than twice per night 09/20/2014  . 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 Hypovitaminosis D 02/18/2014  . Osteopenia 07/02/2013  . 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 Restless legs  syndrome 05/02/2012  . Chronic insomnia 03/27/2012  . Loss of libido 03/27/2012  . Screening for cervical cancer 07/24/2011  . Hyperlipidemia   . Hypertension   . Major depressive disorder with single episode, in partial remission [facility])     Past Surgical History:  Procedure Laterality Date  . APPENDECTOMY    . KNEE SURGERY  2000   right  . [place]   right    Prior to Admission medications   Medication Sig Start Date End Date Taking? Authorizing Provider  aspirin 81 MG tablet Take 81 mg by mouth daily.     Yes Historical Provider, MD  Biotin 1000 MCG tablet Take 1,000 mcg by mouth 3 (three) times daily.   Yes Historical Provider, MD  cetirizine (ZYRTEC ALLERGY) 10 MG tablet Take 10 mg by mouth daily.   Yes Historical Provider, MD  Cholecalciferol (D3-1000 PO) Take 1 tablet by mouth daily.   Yes Historical Provider, MD  Cinnamon 500 MG capsule Take 500 mg by mouth daily.   Yes Historical Provider, MD  co-enzyme Q-10 30 MG capsule Take 30 mg by mouth 3 (three) times daily.   Yes Historical Provider, MD  Cranberry 1000 MG CAPS Take 1 capsule by mouth daily.   Yes Historical Provider, MD  fluticasone (FLONASE) 50 MCG/ACT nasal spray Place 2 sprays into the nose daily as needed. 03/27/12  Yes Crecencio Mc, MD  KRILL OIL PO Take by mouth daily.   Yes Historical Provider, MD  lisinopril (PRINIVIL,ZESTRIL) 30 MG tablet Take 1 tablet (30 mg total) by mouth daily. 09/06/16  Yes Crecencio Mc, MD  Multiple Vitamin (MULTIVITAMIN) tablet Take 1 tablet by mouth daily.     Yes Historical Provider, MD  Probiotic Product (PROBIOTIC PO) Take 1 tablet by mouth daily.   Yes Historical Provider, MD  Pumpkin Seed-Soy Germ (AZO BLADDER CONTROL/GO-LESS PO) Take 1 tablet by mouth 2 (two) times daily.   Yes Historical Provider, MD  sertraline (ZOLOFT) 50 MG tablet TAKE THREE TABLETS BY MOUTH ONCE DAILY 11/13/16  Yes Crecencio Mc, MD  simvastatin (ZOCOR) 40 MG tablet Take 1 tablet (40 mg  total) by mouth at bedtime. 09/06/16  Yes Crecencio Mc, MD  TURMERIC PO Take 1 capsule by mouth daily.   Yes Historical Provider, MD  ALPRAZolam Duanne Moron) 0.5 MG tablet Take 1 tablet (0.5 mg total) by mouth at bedtime as needed for anxiety. Patient not taking: Reported on 11/28/2016 09/18/14   Crecencio Mc, MD  erythromycin [facility]) ophthalmic ointment Place 1 application into the left eye at bedtime. Patient not taking: Reported on 11/28/2016 03/06/16   Crecencio Mc, MD  hydrochlorothiazide (HYDRODIURIL) 25 MG tablet Take 1 tablet (25 mg total) by mouth daily. Patient not taking: Reported on 11/28/2016 03/27/12   Crecencio Mc, MD  pramoxine (PROCTOFOAM) 1 % foam Place 1 application rectally 3 (three) times daily as needed for itching. Patient not taking: Reported on 11/28/2016 03/06/16   Crecencio Mc, MD  solifenacin (VESICARE) 5 MG tablet Take 1 tablet (5 mg total) by mouth daily. Patient not taking: Reported on 11/28/2016 06/04/15   Crecencio Mc, MD    Allergies Penicillins; Percocet [oxycodone-acetaminophen]; and Latex  Family History  Problem Relation Age of Onset  . Cancer Father     lung  . Cancer Mother     cervial    Social History Social History  Substance Use Topics  . Smoking status: Former Research scientist (life sciences)  . Smokeless tobacco: Never Used     Comment: quit 1980  . Alcohol use Yes     Comment: occassionally    Review of Systems  Constitutional: No fever/chills Eyes: No visual changes. ENT: No sore throat. Cardiovascular: as above Respiratory: as above Gastrointestinal: No abdominal pain.  No nausea, no vomiting.  No diarrhea.  No constipation. Genitourinary: Negative for dysuria. Musculoskeletal: Negative for back pain. Skin: Negative for rash. Neurological: Negative for headaches, focal weakness or numbness.   ____________________________________________   PHYSICAL EXAM:  VITAL SIGNS: ED Triage Vitals  Enc Vitals Group     BP 11/28/16 1556 (!) 149/70      Pulse Rate 11/28/16 1556 63     Resp 11/28/16 1556 16     Temp 11/28/16 1556 98.1 F (36.7 C)     Temp Source 11/28/16 1556 Oral     SpO2 11/28/16 1556 96 %     Weight 11/28/16 1557 180 lb (81.6 kg)     Height 11/28/16 1557 5\' 6"  (1.676 m)     Head Circumference --      Peak Flow --      Pain Score 11/28/16 1558 6     Pain Loc --      Pain Edu? --      Excl. in [place]? --     Constitutional: Alert and oriented. Well appearing and in no acute distress. Eyes: Conjunctivae are normal. PERRL. EOMI. Head: Atraumatic. Nose: No congestion/rhinnorhea. Mouth/Throat: Mucous membranes are moist.  Neck: No stridor.   Cardiovascular: Normal rate, regular rhythm. Grossly normal heart sounds.   Respiratory: Normal respiratory effort.  No retractions. Lungs CTAB. Gastrointestinal: Soft and nontender. No distention.  Musculoskeletal: No lower extremity tenderness nor edema.  No joint effusions. Neurologic:  Normal speech and language. No gross focal neurologic deficits are appreciated.  Skin:  Skin is warm, dry and intact. No rash noted. Psychiatric: Mood and affect are normal. Speech and behavior are normal.  ____________________________________________   LABS (all labs ordered are listed, but only abnormal results are displayed)  Labs Reviewed  BASIC METABOLIC PANEL - Abnormal; Notable for the following:       Result Value   Glucose, Bld 102 (*)    BUN 21 (*)    All other components within normal limits  CBC - Abnormal; Notable for the following:    RBC 5.57 (*)    All other components within normal limits  TROPONIN I  TROPONIN I   ____________________________________________  EKG  ED ECG REPORT I, Doran Stabler, the attending physician, personally viewed and interpreted this ECG.   Date: 11/28/2016  EKG Time: 1554  Rate: 65  Rhythm: normal sinus rhythm  Axis: normal  Intervals:none  ST&T Change: No ST segment elevation or depression. No abnormal T-wave  inversion.  ____________________________________________  [id number]    DG Chest 2 View (Final result)  Result time 11/28/16 16:49:42  Final result by Ivar Drape, MD (11/28/16 16:49:42)           Narrative:   CLINICAL DATA: Shortness of breath, chest tightness for 12 days, headache  EXAM: CHEST 2 VIEW  COMPARISON: Chest x-ray of 02/02/2014  FINDINGS: No active infiltrate or effusion is seen. Mediastinal and hilar contours are unremarkable. The heart is mildly enlarged and stable. There may be a small hiatal hernia present. There are mild degenerative changes in the mid to lower thoracic spine.  IMPRESSION: 1. No active lung disease. 2. Probable small hiatal hernia.   Electronically Signed By: Ivar Drape M.D. On: 11/28/2016 16:49            ____________________________________________   PROCEDURES  Procedure(s) performed:   Procedures  Critical Care performed:   ____________________________________________   INITIAL IMPRESSION / ASSESSMENT AND PLAN / ED COURSE  Pertinent labs & imaging results that were available during my care of the patient were reviewed by me and considered in my medical decision making (see chart for details).  ----------------------------------------- 7:37 PM on 11/28/2016 -----------------------------------------  Patient on the cardiac monitor throughout her stay in the emergency department without arrhythmia. Continues to be without any distress or objective signs of pain at this time. Discussed case with Dr. and have cardiology who recommends not starting medication at this time for the patient's palpitations but that she follow-up in the office for cardiac monitoring. I explained this plan to the patient as well as her husband were understanding and willing to comply. They requested to follow-up with the [facility] group.      ____________________________________________   FINAL CLINICAL IMPRESSION(S) /  ED DIAGNOSES  Chest pain. Palpitations. No syncope.    NEW MEDICATIONS STARTED DURING THIS VISIT:  New Prescriptions   No medications on file     Note:  This document was prepared using Dragon voice recognition software and may include unintentional dictation errors.    Orbie Pyo, MD 11/28/16 [phone]

## 2016-11-28 NOTE — Telephone Encounter (Signed)
Patient is heading to ER.FYI

## 2016-11-28 NOTE — Telephone Encounter (Signed)
Pt called about having a irregular heartbeat per pt machine with slight chest pain with a headache. Pt was scheduled for tomorrow on [place] schedule. Pt was transferred to [facility]. Thank you!

## 2016-11-29 ENCOUNTER — Ambulatory Visit: Payer: Medicare Other | Admitting: Family

## 2016-11-29 NOTE — Telephone Encounter (Signed)
Noted, Reviewed er notes. Plans to [facility] cardiology.

## 2016-11-29 NOTE — Progress Notes (Deleted)
Subjective:    Patient ID: Katherine Hudson, female    DOB: 12/07/1945, 71 y.o.   MRN: [id number]  CC: Katherine Hudson is a 71 y.o. female who presents today for follow up.   HPI: HPI ED last night. Reviewed chart.  EKG was negative for STEMI, abnormal T wave. No acute process seen on CXR. No arrhythmia throughout stay of ED. Follow up with [facility] cardiology. Troponin x 2 negative. No anemia. Electrolytes normal.    HISTORY:  Past Medical History:  Diagnosis Date  . Depression   . Hyperlipidemia   . Hypertension    Past Surgical History:  Procedure Laterality Date  . APPENDECTOMY    . KNEE SURGERY  2000   right  . ROTATOR CUFF REPAIR  1998   right   Family History  Problem Relation Age of Onset  . Cancer Father     lung  . Cancer Mother     cervial    Allergies: Penicillins; Percocet [oxycodone-acetaminophen]; and Latex Current Outpatient Prescriptions on File Prior to Visit  Medication Sig Dispense Refill  . ALPRAZolam (XANAX) 0.5 MG tablet Take 1 tablet (0.5 mg total) by mouth at bedtime as needed for anxiety. (Patient not taking: Reported on 11/28/2016) 30 tablet 3  . aspirin 81 MG tablet Take 81 mg by mouth daily.      . Biotin 1000 MCG tablet Take 1,000 mcg by mouth 3 (three) times daily.    . cetirizine (ZYRTEC ALLERGY) 10 MG tablet Take 10 mg by mouth daily.    . Cholecalciferol (D3-1000 PO) Take 1 tablet by mouth daily.    . Cinnamon 500 MG capsule Take 500 mg by mouth daily.    Marland Kitchen co-enzyme Q-10 30 MG capsule Take 30 mg by mouth 3 (three) times daily.    . Cranberry 1000 MG CAPS Take 1 capsule by mouth daily.    Marland Kitchen erythromycin [facility]) ophthalmic ointment Place 1 application into the left eye at bedtime. (Patient not taking: Reported on 11/28/2016) 3.5 g 0  . fluticasone (FLONASE) 50 MCG/ACT nasal spray Place 2 sprays into the nose daily as needed. 16 g 5  . hydrochlorothiazide (HYDRODIURIL) 25 MG tablet Take 1 tablet (25 mg total) by mouth daily. (Patient not  taking: Reported on 11/28/2016) 90 tablet 3  . KRILL OIL PO Take by mouth daily.    Marland Kitchen lisinopril (PRINIVIL,ZESTRIL) 30 MG tablet Take 1 tablet (30 mg total) by mouth daily. 90 tablet 0  . Multiple Vitamin (MULTIVITAMIN) tablet Take 1 tablet by mouth daily.      . pramoxine (PROCTOFOAM) 1 % foam Place 1 application rectally 3 (three) times daily as needed for itching. (Patient not taking: Reported on 11/28/2016) 15 g 0  . Probiotic Product (PROBIOTIC PO) Take 1 tablet by mouth daily.    . Pumpkin Seed-Soy Germ (AZO BLADDER CONTROL/GO-LESS PO) Take 1 tablet by mouth 2 (two) times daily.    . sertraline (ZOLOFT) 50 MG tablet TAKE THREE TABLETS BY MOUTH ONCE DAILY 270 tablet 1  . simvastatin (ZOCOR) 40 MG tablet Take 1 tablet (40 mg total) by mouth at bedtime. 90 tablet 0  . solifenacin (VESICARE) 5 MG tablet Take 1 tablet (5 mg total) by mouth daily. (Patient not taking: Reported on 11/28/2016) 90 tablet 1  . TURMERIC PO Take 1 capsule by mouth daily.     No current facility-administered medications on file prior to visit.     Social History  Substance Use Topics  .  Smoking status: Former Research scientist (life sciences)  . Smokeless tobacco: Never Used     Comment: quit 1980  . Alcohol use Yes     Comment: occassionally    Review of Systems    Objective:    There were no vitals taken for this visit. BP Readings from Last 3 Encounters:  11/28/16 126/72  03/06/16 (!) 144/76  05/26/15 130/74   Wt Readings from Last 3 Encounters:  11/28/16 180 lb (81.6 kg)  03/06/16 180 lb 12.8 oz (82 kg)  05/26/15 182 lb 12 oz (82.9 kg)    Physical Exam     Assessment & Plan:   Problem List Items Addressed This Visit    None       I am having Katherine Hudson maintain her multivitamin, aspirin, KRILL OIL PO, co-enzyme Q-10, fluticasone, hydrochlorothiazide, Biotin, Cranberry, Cinnamon, ALPRAZolam, solifenacin, pramoxine, erythromycin, lisinopril, simvastatin, sertraline, TURMERIC PO, cetirizine, Probiotic Product  (PROBIOTIC PO), Pumpkin Seed-Soy Germ (AZO BLADDER CONTROL/GO-LESS PO), and Cholecalciferol (D3-1000 PO).   No orders of the defined types were placed in this encounter.   Return precautions given.   Risks, benefits, and alternatives of the medications and treatment plan prescribed today were discussed, and patient expressed understanding.   Education regarding symptom management and diagnosis given to patient on AVS.  Continue to follow with Katherine, Aris Everts, MD for routine health maintenance.   Katherine Hudson and I agreed with plan.   Mable Paris, FNP

## 2016-12-06 ENCOUNTER — Telehealth: Payer: Self-pay | Admitting: Internal Medicine

## 2016-12-06 NOTE — Telephone Encounter (Signed)
Pt called requesting a refill on her simvastatin (ZOCOR) 40 MG tablet. She requested that we put for loose tablets, she cannot open the packages. Please advise, thank you!  [place] [address], [place]

## 2016-12-07 ENCOUNTER — Telehealth: Payer: Self-pay | Admitting: Internal Medicine

## 2016-12-07 ENCOUNTER — Telehealth: Payer: Self-pay

## 2016-12-07 MED ORDER — SIMVASTATIN 40 MG PO TABS: 40.0000 mg | ORAL_TABLET | Freq: Every day | ORAL | 0 refills | Status: DC

## 2016-12-07 NOTE — Telephone Encounter (Signed)
rx was refilled and noted on rx that pt would like loose tablets due to not being able to open packets.

## 2016-12-07 NOTE — Telephone Encounter (Signed)
Agree with advice. Thank you.

## 2016-12-07 NOTE — Telephone Encounter (Signed)
Vickey Huger, NP from [facility] called and stated that she is doing a wellness visit with the pt. The pt informed her that the she has been in [place] for the last 5 months. She stated that on 11/27/2016 she had a syncope episode and fell. She came home from [place] the next day and went to the ED. She also stated that for the last 3 weeks she has been having heart palpitations and some dizziness(history of chronic vertigo per pt) and stated that she has not had anymore syncope episodes. NP wanted pt to be seen before the end of the week but advised her that we did not have any appts available until Monday. The NP stated that she would advise the pt that if she had anymore episodes over the weekend that she would need to go back to the ER but that we would go ahead and put her on the scheduled for Monday with Joycelyn Schmid.

## 2016-12-11 ENCOUNTER — Ambulatory Visit (INDEPENDENT_AMBULATORY_CARE_PROVIDER_SITE_OTHER): Payer: Medicare Other | Admitting: Family

## 2016-12-11 ENCOUNTER — Encounter: Payer: Self-pay | Admitting: Family

## 2016-12-11 VITALS — BP 140/65 | HR 86 | Temp 98.4°F | Ht 66.0 in | Wt 186.6 lb

## 2016-12-11 DIAGNOSIS — K644 Residual hemorrhoidal skin tags: Secondary | ICD-10-CM

## 2016-12-11 DIAGNOSIS — R002 Palpitations: Secondary | ICD-10-CM | POA: Diagnosis not present

## 2016-12-11 DIAGNOSIS — I10 Essential (primary) hypertension: Secondary | ICD-10-CM

## 2016-12-11 LAB — COMPREHENSIVE METABOLIC PANEL
ALBUMIN: 4.6 g/dL (ref 3.5–5.2)
ALT: 10 U/L (ref 0–35)
AST: 14 U/L (ref 0–37)
Alkaline Phosphatase: 52 U/L (ref 39–117)
BUN: 20 mg/dL (ref 6–23)
CO2: 24 mEq/L (ref 19–32)
Calcium: 9.7 mg/dL (ref 8.4–10.5)
Chloride: 106 mEq/L (ref 96–112)
Creatinine, Ser: 0.83 mg/dL (ref 0.40–1.20)
GFR: 72.04 mL/min (ref >60.00)
Glucose, Bld: 120 mg/dL — ABNORMAL HIGH (ref 70–99)
POTASSIUM: 4.1 meq/L (ref 3.5–5.1)
Sodium: 139 mEq/L (ref 135–145)
TOTAL PROTEIN: 7.4 g/dL (ref 6.0–8.3)
Total Bilirubin: 1.4 mg/dL — ABNORMAL HIGH (ref 0.2–1.2)

## 2016-12-11 LAB — MAGNESIUM: Magnesium: 2.1 mg/dL (ref 1.5–2.5)

## 2016-12-11 LAB — T3, FREE: T3, Free: 3.5 pg/mL (ref 2.3–4.2)

## 2016-12-11 LAB — TSH: TSH: 1.92 u[IU]/mL (ref 0.35–4.50)

## 2016-12-11 LAB — T4, FREE: FREE T4: 0.9 ng/dL (ref 0.60–1.60)

## 2016-12-11 MED ORDER — LISINOPRIL 40 MG PO TABS: 40.0000 mg | ORAL_TABLET | Freq: Every day | ORAL | 0 refills | Status: DC

## 2016-12-11 NOTE — Patient Instructions (Signed)
As discussed, my advise to still to go to the emergency room to ensure that your episode of chest pain last night was not related to cardiac.   Please stay very vigilant with your symptoms and if ANY recur, call 911.   Increase lisinopril to 40 once daily. Lab appt on Friday - non fasting.   Monitor blood pressure, goal < 135/85.  If symptoms do not improve while we await your seeing Dr Lise Auer, please call our office.

## 2016-12-11 NOTE — Progress Notes (Signed)
Subjective:    Patient ID: Katherine Hudson, female    DOB: 1946-05-03, 71 y.o.   MRN: [id number]  CC: Katherine Hudson is a 71 y.o. female who presents today for an acute visit.    HPI:  CC: palpitations onset one month ago while in [facility], was 'constant' - no triggers.   has been home for 2 weeks and know only occasional palpitions. Chest pain last night for a couple of minutes when laying in bed. No N, V, left arm pain, left arm pain. 'this episode was better than one in April when I went to emergency room.   Does also note an episode a couple of weeks ago when she fell; she felt dizzy at the time. 'doesn't think I passed out. ' Compliant with lisinopril. Averages SBP 160s. Notes increased SOB with activity over the past 4 weeks, unchanged. Has an appointment with [facility] 5/20.   Denies  numbness or tingling radiating to left arm or jaw, dizziness, frequent headaches, changes in vision.   Drove to [facility].   OSA- tested 3-4 years and does not have per patient.   No CP or SOB in exam room.    11/28/16 Went to ED for Chest pain. Negative troponins. Normal EKG.  HISTORY:  Past Medical History:  Diagnosis Date  . Depression   . Hyperlipidemia   . Hypertension    Past Surgical History:  Procedure Laterality Date  . APPENDECTOMY    . KNEE SURGERY  2000   right  . ROTATOR CUFF REPAIR  1998   right   Family History  Problem Relation Age of Onset  . Cancer Father     lung  . Cancer Mother     cervial    Allergies: Penicillins; Percocet [oxycodone-acetaminophen]; and Latex Current Outpatient Prescriptions on File Prior to Visit  Medication Sig Dispense Refill  . aspirin 81 MG tablet Take 81 mg by mouth daily.      . Biotin 1000 MCG tablet Take 1,000 mcg by mouth 3 (three) times daily.    . cetirizine (ZYRTEC ALLERGY) 10 MG tablet Take 10 mg by mouth daily.    . Cholecalciferol (D3-1000 PO) Take 1 tablet by mouth daily.    . Cinnamon 500 MG capsule Take 500 mg by mouth daily.     Marland Kitchen co-enzyme Q-10 30 MG capsule Take 30 mg by mouth 3 (three) times daily.    . Cranberry 1000 MG CAPS Take 1 capsule by mouth daily.    . fluticasone (FLONASE) 50 MCG/ACT nasal spray Place 2 sprays into the nose daily as needed. 16 g 5  . KRILL OIL PO Take by mouth daily.    . Multiple Vitamin (MULTIVITAMIN) tablet Take 1 tablet by mouth daily.      . Probiotic Product (PROBIOTIC PO) Take 1 tablet by mouth daily.    . sertraline (ZOLOFT) 50 MG tablet TAKE THREE TABLETS BY MOUTH ONCE DAILY 270 tablet 1  . simvastatin (ZOCOR) 40 MG tablet Take 1 tablet (40 mg total) by mouth at bedtime. 90 tablet 0  . TURMERIC PO Take 1 capsule by mouth daily.     No current facility-administered medications on file prior to visit.     Social History  Substance Use Topics  . Smoking status: Former Research scientist (life sciences)  . Smokeless tobacco: Never Used     Comment: quit 1980  . Alcohol use Yes     Comment: occassionally    Review of Systems  Constitutional: Negative for  chills and fever.  Eyes: Negative for visual disturbance.  Respiratory: Positive for shortness of breath. Negative for cough.   Cardiovascular: Positive for chest pain and palpitations. Negative for leg swelling.  Gastrointestinal: Negative for nausea and vomiting.  Neurological: Positive for dizziness. Negative for syncope.      Objective:    BP 140/65   Pulse 86   Temp 98.4 F (36.9 C) (Oral)   Ht 5\' 6"  (1.676 m)   Wt 186 lb 9.6 oz (84.6 kg)   SpO2 96%   BMI 30.12 kg/m    Physical Exam  Constitutional: She appears well-developed and well-nourished.  Eyes: Conjunctivae are normal.  Cardiovascular: Normal rate, regular rhythm, normal heart sounds and normal pulses.   Pulmonary/Chest: Effort normal and breath sounds normal. She has no wheezes. She has no rhonchi. She has no rales.  Neurological: She is alert.  Skin: Skin is warm and dry.  Psychiatric: She has a normal mood and affect. Her speech is normal and behavior is normal.  Thought content normal.  Vitals reviewed.      Assessment & Plan:   Problem List Items Addressed This Visit      Cardiovascular and Mediastinum   Hypertension    Elevated today. Calibrated  blood pressure cuff.  Increased lisinopril. Recheck bmp 5 days.       Relevant Medications   lisinopril (PRINIVIL,ZESTRIL) 40 MG tablet   Other Relevant Orders   Basic metabolic panel   Inflamed external hemorrhoid   Relevant Medications   lisinopril (PRINIVIL,ZESTRIL) 40 MG tablet     Other   Palpitations - Primary    Ongoing for several weeks. Patient also reports episodes chest pain last night which was "better than prior episode of chest pain". She went emergency room a month ago negative troponins, no ischemia seen on EKG. Today I advised her to go back to emergency room as a likely require continuous cardiac monitoring and serial troponins. She was very polite however refused this recommendation. She states chest pain does not feeling different than it has in the past.  No CP, SOB while in room today. EKG shows no acute ischemia. When compared to prior EKG 11/2016, no significant changes. Q waves seen in today's EKG ( lead II and III) as well as EKG 11/2016. She will see Dr. Rockey Situ in a couple weeks. She would like basic lab work done to evaluate for other causes of palpitations which I think is reasonable. I agreed the lab work today as long as she remain vigilant and will call 911 or return to emergency room if any recurrence of chest pain.      Relevant Orders   EKG 12-Lead (Completed)   Comprehensive metabolic panel   TSH   Magnesium   T3, free   T4, free        I have discontinued Katherine Hudson's hydrochlorothiazide, ALPRAZolam, solifenacin, pramoxine, erythromycin, and Pumpkin Seed-Soy Germ (AZO BLADDER CONTROL/GO-LESS PO). I have also changed her lisinopril. Additionally, I am having her maintain her multivitamin, aspirin, KRILL OIL PO, co-enzyme Q-10, fluticasone, Biotin, Cranberry,  Cinnamon, sertraline, TURMERIC PO, cetirizine, Probiotic Product (PROBIOTIC PO), Cholecalciferol (D3-1000 PO), and simvastatin.   Meds ordered this encounter  Medications  . lisinopril (PRINIVIL,ZESTRIL) 40 MG tablet    Sig: Take 1 tablet (40 mg total) by mouth daily.    Dispense:  90 tablet    Refill:  0    Patient need appt for further refills    Order Specific Question:  Supervising Provider    Answer:   Crecencio Mc [[id number]]    Return precautions given.   Risks, benefits, and alternatives of the medications and treatment plan prescribed today were discussed, and patient expressed understanding.   Education regarding symptom management and diagnosis given to patient on AVS.  Continue to follow with Crecencio Mc, MD for routine health maintenance.   Katherine Hudson and I agreed with plan.   Mable Paris, FNP

## 2016-12-11 NOTE — Assessment & Plan Note (Signed)
Elevated today. Calibrated  blood pressure cuff.  Increased lisinopril. Recheck bmp 5 days.

## 2016-12-11 NOTE — Progress Notes (Signed)
Pre visit review using our clinic review tool, if applicable. No additional management support is needed unless otherwise documented below in the visit note. 

## 2016-12-11 NOTE — Assessment & Plan Note (Signed)
Ongoing for several weeks. Patient also reports episodes chest pain last night which was "better than prior episode of chest pain". She went emergency room a month ago negative troponins, no ischemia seen on EKG. Today I advised her to go back to emergency room as a likely require continuous cardiac monitoring and serial troponins. She was very polite however refused this recommendation. She states chest pain does not feeling different than it has in the past.  No CP, SOB while in room today. EKG shows no acute ischemia. When compared to prior EKG 11/2016, no significant changes. Q waves seen in today's EKG ( lead II and III) as well as EKG 11/2016. She will see Dr. Rockey Situ in a couple weeks. She would like basic lab work done to evaluate for other causes of palpitations which I think is reasonable. I agreed the lab work today as long as she remain vigilant and will call 911 or return to emergency room if any recurrence of chest pain.

## 2016-12-15 NOTE — Progress Notes (Signed)
Left message for patient to return call back.  

## 2016-12-18 ENCOUNTER — Telehealth: Payer: Self-pay | Admitting: Internal Medicine

## 2016-12-18 ENCOUNTER — Other Ambulatory Visit (INDEPENDENT_AMBULATORY_CARE_PROVIDER_SITE_OTHER): Payer: Medicare Other

## 2016-12-18 DIAGNOSIS — I10 Essential (primary) hypertension: Secondary | ICD-10-CM

## 2016-12-18 LAB — BASIC METABOLIC PANEL
BUN: 25 mg/dL — AB (ref 6–23)
CALCIUM: 9.6 mg/dL (ref 8.4–10.5)
CO2: 22 meq/L (ref 19–32)
CREATININE: 1.15 mg/dL (ref 0.40–1.20)
Chloride: 108 mEq/L (ref 96–112)
GFR: 49.44 mL/min — ABNORMAL LOW (ref >60.00)
GLUCOSE: 138 mg/dL — AB (ref 70–99)
Potassium: 4.2 mEq/L (ref 3.5–5.1)
Sodium: 140 mEq/L (ref 135–145)

## 2016-12-18 NOTE — Telephone Encounter (Signed)
Call pt  Pleased to see BP improved on higher dose of lisinopril  Pleased to see she sees dr Rockey Situ next week  Let me know if she needs anything in between and certainly any new symptoms

## 2016-12-18 NOTE — Telephone Encounter (Signed)
Patient walked into clinic fo rlab for BMP and ask for BP check BP left arm 114/74 pulse 68 )2 sat @ 96, patient added to lab schedule.

## 2016-12-19 ENCOUNTER — Other Ambulatory Visit: Payer: Self-pay | Admitting: Family

## 2016-12-19 DIAGNOSIS — I10 Essential (primary) hypertension: Secondary | ICD-10-CM

## 2016-12-22 DIAGNOSIS — H2513 Age-related nuclear cataract, bilateral: Secondary | ICD-10-CM | POA: Diagnosis not present

## 2016-12-25 ENCOUNTER — Other Ambulatory Visit (INDEPENDENT_AMBULATORY_CARE_PROVIDER_SITE_OTHER): Payer: Medicare Other

## 2016-12-25 DIAGNOSIS — I10 Essential (primary) hypertension: Secondary | ICD-10-CM | POA: Diagnosis not present

## 2016-12-25 LAB — COMPREHENSIVE METABOLIC PANEL
ALBUMIN: 4.4 g/dL (ref 3.5–5.2)
ALT: 14 U/L (ref 0–35)
AST: 17 U/L (ref 0–37)
Alkaline Phosphatase: 54 U/L (ref 39–117)
BUN: 18 mg/dL (ref 6–23)
CALCIUM: 9.5 mg/dL (ref 8.4–10.5)
CO2: 24 mEq/L (ref 19–32)
CREATININE: 0.87 mg/dL (ref 0.40–1.20)
Chloride: 108 mEq/L (ref 96–112)
GFR: 68.22 mL/min (ref >60.00)
Glucose, Bld: 103 mg/dL — ABNORMAL HIGH (ref 70–99)
Potassium: 4.2 mEq/L (ref 3.5–5.1)
Sodium: 139 mEq/L (ref 135–145)
Total Bilirubin: 0.7 mg/dL (ref 0.2–1.2)
Total Protein: 7 g/dL (ref 6.0–8.3)

## 2016-12-26 DIAGNOSIS — R079 Chest pain, unspecified: Secondary | ICD-10-CM | POA: Insufficient documentation

## 2016-12-26 NOTE — Progress Notes (Signed)
Cardiology Office Note  Date:  12/27/2016   ID:  Katherine, Hudson [birth year], MRN [id number]  PCP:  Crecencio Mc, MD   Chief Complaint  Patient presents with  . other    F/u ED due to chest pain/palpitations c/o chest tightness and irregular heart beat. Meds reviewed verbally with pt.    HPI:  71 year old woman with history of  depression,  hypertension,  hyperlipidemia Presenting for new patient evaluation  referral from Mable Paris for symptoms of chest pain and palpitations  On today's visit she reports having tachycardia that started this morning Had normal pulse rate last night, rapid pulse rate today  this is similar to the palpitations she has been appreciating on and off for the past 6 months  When she does have tachycardia, she does have some occasional chest tightness, worse on exertion associated with some shortness of breath Denies any leg swelling  Denies having any discomfort in her chest in the office today, She appears relaxed, no significant shortness of breath on exam  Symptoms started back when she was in [place], often on Since she has been home symptoms again off and on  No rhyme or reason, comes on at rest and sometimes with exertion   In April went to the emergency room , noted to be in normal sinus rhythm   Workup negative  Lisinopril recently increased up to 40 mg daily for hypertension   Denies  numbness or tingling radiating to left arm or jaw, dizziness, frequent headaches, changes in vision.   OSA- tested 3-4 years and does not have per patient.   11/28/16 Went to ED for Chest pain, palpitations.  Negative troponins. Normal EKG.  12 days of chest pain, felt like a squeezing, intermittent Worse with exertion, mild shortness of breath  [address], felt like she was going to pass out Workup in the emergency room was unrevealing  EKG personally reviewed by myself on todays visit Shows atrial flutter with ventricular rate 140 bpm no  significant ST or T-wave changes   PMH:   has a past medical history of Depression; Hyperlipidemia; and Hypertension.  PSH:    Past Surgical History:  Procedure Laterality Date  . APPENDECTOMY    . KNEE SURGERY  2000   right  . ROTATOR CUFF REPAIR  1998   right    Current Outpatient Prescriptions  Medication Sig Dispense Refill  . Cholecalciferol (D3-1000 PO) Take 1 tablet by mouth daily.    Marland Kitchen CINNAMON PO Take 2,000 mg by mouth daily.    . Coenzyme Q10 (CO Q 10 PO) Take 200 mg by mouth daily.    . Cranberry 200 MG CAPS Take by mouth daily.    . fluticasone (FLONASE) 50 MCG/ACT nasal spray Place 2 sprays into the nose daily as needed. 16 g 5  . KRILL OIL PO Take by mouth daily.    . Multiple Vitamin (MULTIVITAMIN) tablet Take 1 tablet by mouth daily.      . Naproxen Sodium (ALEVE PO) Take 500 mg by mouth as needed.    . NON FORMULARY Mega Red Takes 1 tablet by mouth daily.    . NON FORMULARY Cannabis oil QD PRN    . oxybutynin (OXYTROL) 3.9 MG/24HR Place 1 patch onto the skin as directed.    . Probiotic Product (PROBIOTIC PO) Take 1 tablet by mouth daily.    . sertraline (ZOLOFT) 50 MG tablet TAKE THREE TABLETS BY MOUTH ONCE DAILY 270 tablet 1  .  simvastatin (ZOCOR) 40 MG tablet Take 1 tablet (40 mg total) by mouth at bedtime. 90 tablet 0  . TURMERIC PO Take 1 capsule by mouth daily.    . digoxin (LANOXIN) 0.25 MG tablet Take 1 tablet (0.25 mg total) by mouth daily. 30 tablet 6  . diltiazem (CARDIZEM CD) 120 MG 24 hr capsule Take 1 capsule (120 mg total) by mouth daily. 30 capsule 6  . rivaroxaban (XARELTO) 20 MG TABS tablet Take 1 tablet (20 mg total) by mouth daily with supper. 30 tablet 6   No current facility-administered medications for this visit.      Allergies:   Penicillins; Percocet [oxycodone-acetaminophen]; and Latex   Social History:  The patient  reports that she has quit smoking. She quit after 10.00 years of use. She has never used smokeless tobacco. She  reports that she drinks alcohol. She reports that she does not use drugs.   Family History:   family history includes Cancer in her father and mother; Stroke in her sister.    Review of Systems: Review of Systems  Constitutional: Negative.   Respiratory: Positive for shortness of breath.   Cardiovascular: Positive for palpitations.       Tachycardia  Gastrointestinal: Negative.   Musculoskeletal: Negative.   Neurological: Negative.   Psychiatric/Behavioral: Negative.   All other systems reviewed and are negative.    PHYSICAL EXAM: VS:  BP 100/74 (BP Location: Right Arm, Patient Position: Sitting, Cuff Size: Normal)   Pulse (!) 140   Ht 5\' 6"  (1.676 m)   Wt 182 lb (82.6 kg)   BMI 29.38 kg/m  , BMI Body mass index is 29.38 kg/m. GEN: Well nourished, well developed, in no acute distress obese HEENT: normal  Neck: no JVD, carotid bruits, or masses Cardiac: Regular rate, tachycardic,  no murmurs, rubs, or gallops,no edema  Respiratory:  clear to auscultation bilaterally, normal work of breathing GI: soft, nontender, nondistended, + BS MS: no deformity or atrophy  Skin: warm and dry, no rash Neuro:  Strength and sensation are intact Psych: euthymic mood, full affect    Recent Labs: 11/28/2016: Hemoglobin 15.6; Platelets 196 12/11/2016: Magnesium 2.1; TSH 1.92 12/25/2016: ALT 14; BUN 18; Creatinine, Ser 0.87; Potassium 4.2; Sodium 139    Lipid Panel Lab Results  Component Value Date   CHOL 153 03/06/2016   HDL 41.60 03/06/2016   LDLCALC 75 03/06/2016   TRIG 185.0 (H) 03/06/2016      Wt Readings from Last 3 Encounters:  12/27/16 182 lb (82.6 kg)  12/11/16 186 lb 9.6 oz (84.6 kg)  11/28/16 180 lb (81.6 kg)       ASSESSMENT AND PLAN:  Chest pain, unspecified type - Plan: EKG 12-Lead Chest pain associated with her atrial flutter, likely rate related We'll attempt to restore normal sinus rhythm with medication changes as below We could consider ischemic workup at  a later date  Essential hypertension - Plan: EKG 12-Lead Recommended she stop lisinopril Suggested she start diltiazem extended release 120 mg daily Recommended she monitor blood pressure at home for further medication titration Low blood pressure on today's visit likely from underlying tachycardia  Mixed hyperlipidemia - Plan: EKG 12-Lead Cholesterol is at goal on the current lipid regimen. No changes to the medications were made.  Typical atrial flutter (HCC) rapid rate on today's visit, 140 bpm   clinically she is doing fine apart from sensation of palpitations and mild shortness of breath on exertion. For this reason we will not send her  to the emergency room. Symptoms going on and off for 6 months per the patient's husband. Recommended she stop lisinopril, start diltiazem as above We will also give digoxin 0.25 mg 2 doses today and one dose daily. She could likely hold the digoxin when she goes back to normal sinus rhythm. We'll start anticoagulation with xarelto in cased cardioversion is needed. Samples provided  We did discuss ablation . She did seem interested   recommended she call us once heart rate goes back to normal    Total encounter time more than 45 minutes  Greater than 50% was spent in counseling and coordination of care with the patient   Disposition:   F/U One month    Orders Placed This Encounter  Procedures  . EKG 12-Lead     Signed, Esmond Plants, M.D., Ph.D. 12/27/2016  [place], [place]

## 2016-12-27 ENCOUNTER — Telehealth: Payer: Self-pay | Admitting: Cardiovascular Disease

## 2016-12-27 ENCOUNTER — Ambulatory Visit (INDEPENDENT_AMBULATORY_CARE_PROVIDER_SITE_OTHER): Payer: Medicare Other | Admitting: Cardiovascular Disease

## 2016-12-27 ENCOUNTER — Encounter: Payer: Self-pay | Admitting: Cardiovascular Disease

## 2016-12-27 ENCOUNTER — Encounter: Payer: Self-pay | Admitting: Family

## 2016-12-27 VITALS — BP 100/74 | HR 140 | Ht 66.0 in | Wt 182.0 lb

## 2016-12-27 DIAGNOSIS — I483 Typical atrial flutter: Secondary | ICD-10-CM

## 2016-12-27 DIAGNOSIS — I4892 Unspecified atrial flutter: Secondary | ICD-10-CM | POA: Insufficient documentation

## 2016-12-27 DIAGNOSIS — E782 Mixed hyperlipidemia: Secondary | ICD-10-CM | POA: Diagnosis not present

## 2016-12-27 DIAGNOSIS — R079 Chest pain, unspecified: Secondary | ICD-10-CM

## 2016-12-27 DIAGNOSIS — I10 Essential (primary) hypertension: Secondary | ICD-10-CM | POA: Diagnosis not present

## 2016-12-27 MED ORDER — DILTIAZEM HCL ER COATED BEADS 120 MG PO CP24: 120.0000 mg | ORAL_CAPSULE | Freq: Every day | ORAL | 6 refills | Status: DC

## 2016-12-27 MED ORDER — RIVAROXABAN 20 MG PO TABS: 20.0000 mg | ORAL_TABLET | Freq: Every day | ORAL | 6 refills | Status: DC

## 2016-12-27 MED ORDER — DIGOXIN 250 MCG PO TABS: 0.2500 mg | ORAL_TABLET | Freq: Every day | ORAL | 6 refills | Status: DC

## 2016-12-27 NOTE — Telephone Encounter (Signed)
Attempted to contact pt to see if she is out of afib. Pt does not have any #s listed for herself.  Attempted to contact husband. No answer, no vm at home #, mobile # is incorrect - lady answered stating that I have the wrong #. Pt's husband is sched to see Dr. Rockey Situ tomorrow - can we get updated contact info on her?

## 2016-12-27 NOTE — Patient Instructions (Addendum)
Medication Instructions:   Please stop the lisinopril Start diltiazem 120 mg daily  Start digoxin 2 the first day then one daily Stop when you are back in Normal rhythm  Start xarelto one a day to prevent stroke  Labwork:  No new labs needed  Testing/Procedures:  No further testing at this time   I recommend watching educational videos on topics of interest to you at:       [email or web address].com  Enter code: [email or web address]    Follow-Up: It was a pleasure seeing you in the office today. Please call us if you have new issues that need to be addressed before your next appt.  [phone]  Your physician wants you to follow-up in: 1 month.    If you need a refill on your cardiac medications before your next appointment, please call your pharmacy.

## 2016-12-28 ENCOUNTER — Ambulatory Visit (INDEPENDENT_AMBULATORY_CARE_PROVIDER_SITE_OTHER): Payer: Medicare Other | Admitting: Internal Medicine

## 2016-12-28 VITALS — BP 124/60 | HR 52 | Ht 66.0 in | Wt 182.0 lb

## 2016-12-28 DIAGNOSIS — I4892 Unspecified atrial flutter: Secondary | ICD-10-CM | POA: Diagnosis not present

## 2016-12-28 NOTE — Telephone Encounter (Signed)
Updated patient telephone number via husband

## 2016-12-28 NOTE — Patient Instructions (Addendum)
Medication Instructions: - Your physician has recommended you make the following change in your medication:  1) hold digoxin for now 2) hold diltiazem for now  Labwork: - pending date of procedure  Procedures/Testing: - Your physician has requested that you have an echocardiogram. Echocardiography is a painless test that uses sound waves to create images of your heart. It provides your doctor with information about the size and shape of your heart and how well your heart's chambers and valves are working. This procedure takes approximately one hour. There are no restrictions for this procedure.  - Your physician has recommended that you have an atrial flutter ablation. Catheter ablation is a medical procedure used to treat some cardiac arrhythmias (irregular heartbeats). During catheter ablation, a long, thin, flexible tube is put into a blood vessel in your groin (upper thigh), or neck. This tube is called an ablation catheter. It is then guided to your heart through the blood vessel. Radio frequency waves destroy small areas of heart tissue where abnormal heartbeats may cause an arrhythmia to start. Please see the instruction sheet given to you today.  Follow-Up: - pending date of procedure  Any Additional Special Instructions Will Be Listed Below (If Applicable).     If you need a refill on your cardiac medications before your next appointment, please call your pharmacy.

## 2016-12-28 NOTE — Progress Notes (Signed)
ELECTROPHYSIOLOGY CONSULT NOTE  Patient ID: Katherine Hudson, MRN: [id number], DOB/AGE: [birth year] 71 y.o. 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 female  when asked to see today for atrial flutter.  She's been having tachypalpitations one and off for months. These can last a day or 2 at a time. She's been found to be in atrial flutter. These episodes are associated with rapid rates lightheadedness shortness of breath and chest discomfort. She was started on anticoagulation yesterday.  She has sinus bradycardia. It is not clear as to how limiting this is but denies exertional shortness of breath or chest discomfort.  Thromboembolic risk factors ( age -47, HTN-1, Gender-1) for a CHADSVASc Score of  3         Past Medical History:  Diagnosis Date  . Depression   . Hyperlipidemia   . Hypertension       Surgical History:  Past Surgical History:  Procedure Laterality Date  . APPENDECTOMY    . KNEE SURGERY  2000   right  . ROTATOR CUFF REPAIR  1998   right     Home Meds: Prior to Admission medications   Medication Sig Start Date End Date Taking? Authorizing Provider  Cholecalciferol (D3-1000 PO) Take 1 tablet by mouth daily.   Yes [provider]  CINNAMON PO Take 2,000 mg by mouth daily.   Yes [provider]  Coenzyme Q10 (CO Q 10 PO) Take 200 mg by mouth daily.   Yes [provider]  Cranberry 200 MG CAPS Take by mouth daily.   Yes [provider]  digoxin (LANOXIN) 0.25 MG tablet Take 1 tablet (0.25 mg total) by mouth daily. 12/27/16  Yes Katherine Merritts, MD  diltiazem (CARDIZEM CD) 120 MG 24 hr capsule Take 1 capsule (120 mg total) by mouth daily. 12/27/16  Yes Gollan, Katherine November, MD    fluticasone (FLONASE) 50 MCG/ACT nasal spray Place 2 sprays into the nose daily as needed. 03/27/12  Yes Katherine Mc, MD  KRILL OIL PO Take by mouth daily.   Yes [provider]  Multiple Vitamin (MULTIVITAMIN) tablet Take 1 tablet by mouth daily.     Yes [provider]  Naproxen Sodium (ALEVE PO) Take 500 mg by mouth as needed.   Yes [provider]  NON FORMULARY Mega Red Takes 1 tablet by mouth daily.   Yes [provider]  NON FORMULARY Cannabis oil QD PRN   Yes [provider]  oxybutynin (OXYTROL) 3.9 MG/24HR Place 1 patch onto the skin as directed.   Yes [provider]  Probiotic Product (PROBIOTIC PO) Take 1 tablet by mouth daily.   Yes [provider]  rivaroxaban (XARELTO) 20 MG TABS tablet Take 1 tablet (20 mg total) by mouth daily with supper. 12/27/16  Yes Katherine Merritts, MD  sertraline (ZOLOFT) 50 MG tablet TAKE THREE TABLETS BY MOUTH ONCE DAILY 11/13/16  Yes Katherine Mc, MD  simvastatin (ZOCOR) 40 MG tablet Take 1 tablet (40 mg total) by mouth at bedtime. 12/07/16  Yes Katherine Mc, MD  TURMERIC PO Take 1 capsule by mouth daily.   Yes [provider]    Allergies:  Allergies  Allergen Reactions  .  Penicillins     As a child  . Percocet [Oxycodone-Acetaminophen]   . Latex Rash and Other (See Comments)    Knee scabbed over after using bandage    Social History   Social History  . Marital status: Married    Spouse name: N/A  . Number of children: N/A  . Years of education: N/A   Occupational History  . Not on file.   Social History Main Topics  . Smoking status: Former Smoker    Years: 10.00  . Smokeless tobacco: Never Used     Comment: quit 1980  . Alcohol use Yes     Comment: occassionally  . Drug use: No  . Sexual activity: Not on file   Other Topics Concern  . Not on file   Social History Narrative  . No narrative on file     Family History  Problem Relation Age of  Onset  . Cancer Father        lung  . Cancer Mother        cervial  . Stroke Sister      ROS:  Please see the history of present illness.     All other systems reviewed and negative.    Physical Exam: Blood pressure 124/60, pulse (!) 52, height 5\' 6"  (1.676 m), weight 182 lb (82.6 kg). General: Well developed, well nourished female in no acute distress. Head: Normocephalic, atraumatic, sclera non-icteric, no xanthomas, nares are without discharge. EENT: normal  Lymph Nodes:  none Neck: Negative for carotid bruits. JVD not elevated. Back:without scoliosis kyphosis Lungs: Clear bilaterally to auscultation without wheezes, rales, or rhonchi. Breathing is unlabored. Heart: RRR with S1 S2. No murmur . No rubs, or gallops appreciated. Abdomen: Soft, non-tender, non-distended with normoactive bowel sounds. No hepatomegaly. No rebound/guarding. No obvious abdominal masses. Msk:  Strength and tone appear normal for age. Extremities: No clubbing or cyanosis. No edema.  Distal pedal pulses are 2+ and equal bilaterally. Skin: Warm and Dry Neuro: Alert and oriented X 3. CN III-XII intact Grossly normal sensory and motor function . Psych:  Responds to questions appropriately with a normal affect.      Labs: Cardiac Enzymes No results for input(s): CKTOTAL, CKMB, TROPONINI in the last 72 hours. CBC Lab Results  Component Value Date   WBC 8.5 11/28/2016   HGB 15.6 11/28/2016   HCT 45.5 11/28/2016   MCV 81.7 11/28/2016   PLT 196 11/28/2016   PROTIME: No results for input(s): LABPROT, INR in the last 72 hours. Chemistry  Recent Labs Lab 12/25/16 1028  NA 139  K 4.2  CL 108  CO2 24  BUN 18  CREATININE 0.87  CALCIUM 9.5  PROT 7.0  BILITOT 0.7  ALKPHOS 54  ALT 14  AST 17  GLUCOSE 103*   Lipids Lab Results  Component Value Date   CHOL 153 03/06/2016   HDL 41.60 03/06/2016   LDLCALC 75 03/06/2016   TRIG 185.0 (H) 03/06/2016   BNP No results found for: PROBNP Thyroid  Function Tests: No results for input(s): TSH, T4TOTAL, T3FREE, THYROIDAB in the last 72 hours.  Invalid input(s): FREET3 Miscellaneous No results found for: DDIMER  Radiology/Studies:  Dg Chest 2 View  Result Date: 11/28/2016 CLINICAL DATA:  Shortness of breath, chest tightness for 12 days, headache EXAM: CHEST  2 VIEW COMPARISON:  Chest x-ray of 02/02/2014 FINDINGS: No active infiltrate or effusion is seen. Mediastinal and hilar contours are unremarkable. The heart is mildly enlarged and stable. There  may be a small hiatal hernia present. There are mild degenerative changes in the mid to lower thoracic spine. IMPRESSION: 1. No active lung disease. 2. Probable small hiatal hernia. Electronically Signed   By: Ivar Drape M.D.   On: 11/28/2016 16:49    EKG: Personally reviewed  atrial flutter-atypical with negative flutter waves in lead 2 but also negative flutter  waves in lead V1   Assessment and Plan:  Atrial Flutter atypical  Sinus bradycardia  The patient has recurrent atrial flutter which is somewhat atypical. She has had no instrumentation of her heart. Likely represents typical flutter thus not withstanding. Given her sinus bradycardia, she does not have good medical treatment options. She would like to be considered for ablation.  Given scheduling opportunities, I have discussed with Dr. Elliot Cousin his undertaking this ablation procedure.  I have discussed with her the potential complications and risks of the procedure as well as the fact that it may not be amenable to catheter ablation. We have also discussed the high likelihood of atrial fibrillation occurring in the wake of ablation.  She needs an echocardiogram to look at LV structure and atrial size.  She appropriately has been started on anticoagulation. I have reminded her to take her Rivaroxaban at night with her main meal  With her sinus bradycardia I have recommended that they discontinue diltiazem and digoxin.        Virl Axe

## 2016-12-29 ENCOUNTER — Encounter: Payer: Self-pay | Admitting: *Deleted

## 2016-12-29 ENCOUNTER — Other Ambulatory Visit: Payer: Self-pay | Admitting: *Deleted

## 2016-12-29 ENCOUNTER — Telehealth: Payer: Self-pay | Admitting: Internal Medicine

## 2016-12-29 MED ORDER — DILTIAZEM HCL 30 MG PO TABS: ORAL_TABLET | ORAL | 0 refills | Status: DC

## 2016-12-29 NOTE — Telephone Encounter (Signed)
I attempted to contact the patient's husband as requested by the patient to discuss the details of her flutter ablation scheduled for 01/17/17. I left a message on Mr. Dragos identified voice mail that the procedure is scheduled for Wednesday 01/17/17. She will need to arrive at 10:30 am for a 12:30 pm procedure.   I also advised that per Dr. Rockey Situ, he would like her to have Diltiazem 30 mg PRN for breakthrough of her fast heart rates. I advised I would send this in to [facility] on [address] in [place].

## 2017-01-02 ENCOUNTER — Telehealth: Payer: Self-pay | Admitting: *Deleted

## 2017-01-02 ENCOUNTER — Other Ambulatory Visit: Payer: Self-pay | Admitting: *Deleted

## 2017-01-02 ENCOUNTER — Ambulatory Visit: Payer: Medicare Other

## 2017-01-02 NOTE — Telephone Encounter (Signed)
Refill Request for Diltiazem 120 mg tablet received from pharmacy. Diltiazem 30 mg tablet prn on medication list please advise which one to refill, Thank you.

## 2017-01-03 ENCOUNTER — Ambulatory Visit (INDEPENDENT_AMBULATORY_CARE_PROVIDER_SITE_OTHER): Payer: Medicare Other | Admitting: Internal Medicine

## 2017-01-03 ENCOUNTER — Encounter: Payer: Self-pay | Admitting: Internal Medicine

## 2017-01-03 VITALS — BP 132/74 | HR 58 | Temp 98.1°F | Resp 15 | Ht 66.0 in | Wt 182.8 lb

## 2017-01-03 DIAGNOSIS — R079 Chest pain, unspecified: Secondary | ICD-10-CM | POA: Diagnosis not present

## 2017-01-03 DIAGNOSIS — R7303 Prediabetes: Secondary | ICD-10-CM | POA: Diagnosis not present

## 2017-01-03 DIAGNOSIS — E782 Mixed hyperlipidemia: Secondary | ICD-10-CM

## 2017-01-03 DIAGNOSIS — Z1231 Encounter for screening mammogram for malignant neoplasm of breast: Secondary | ICD-10-CM | POA: Diagnosis not present

## 2017-01-03 DIAGNOSIS — Z1239 Encounter for other screening for malignant neoplasm of breast: Secondary | ICD-10-CM

## 2017-01-03 DIAGNOSIS — E034 Atrophy of thyroid (acquired): Secondary | ICD-10-CM

## 2017-01-03 DIAGNOSIS — Z8619 Personal history of other infectious and parasitic diseases: Secondary | ICD-10-CM | POA: Diagnosis not present

## 2017-01-03 DIAGNOSIS — Z0000 Encounter for general adult medical examination without abnormal findings: Secondary | ICD-10-CM | POA: Diagnosis not present

## 2017-01-03 DIAGNOSIS — K591 Functional diarrhea: Secondary | ICD-10-CM | POA: Diagnosis not present

## 2017-01-03 DIAGNOSIS — K529 Noninfective gastroenteritis and colitis, unspecified: Secondary | ICD-10-CM | POA: Diagnosis not present

## 2017-01-03 DIAGNOSIS — I484 Atypical atrial flutter: Secondary | ICD-10-CM | POA: Diagnosis not present

## 2017-01-03 DIAGNOSIS — I10 Essential (primary) hypertension: Secondary | ICD-10-CM

## 2017-01-03 LAB — LDL CHOLESTEROL, DIRECT: Direct LDL: 85 mg/dL

## 2017-01-03 LAB — LIPID PANEL
CHOL/HDL RATIO: 5
Cholesterol: 159 mg/dL (ref 0–200)
HDL: 34 mg/dL — ABNORMAL LOW (ref >39.00)
NonHDL: 124.91
TRIGLYCERIDES: 259 mg/dL — AB (ref 0.0–149.0)
VLDL: 51.8 mg/dL — ABNORMAL HIGH (ref 0.0–40.0)

## 2017-01-03 LAB — HEMOGLOBIN A1C: Hgb A1c MFr Bld: 6.2 % (ref 4.6–6.5)

## 2017-01-03 MED ORDER — PRAMOXINE HCL 1 % RE FOAM: 1.0000 "application " | Freq: Three times a day (TID) | RECTAL | 0 refills | Status: AC | PRN

## 2017-01-03 MED ORDER — DILTIAZEM HCL 30 MG PO TABS: ORAL_TABLET | ORAL | 0 refills | Status: DC

## 2017-01-03 MED ORDER — DIBUCAINE 1 % RE OINT: 1.0000 "application " | TOPICAL_OINTMENT | RECTAL | 0 refills | Status: DC | PRN

## 2017-01-03 NOTE — Patient Instructions (Addendum)
If your morning heart rate is irregular and over 100,  Recheck in one minute  Take the diltiazem  If still elevated   Your last test for c dif was negative in August 2017.  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  Take the medication  30 minutes prior to eating   I am making a referral to Dr Vira Agar for evaluation of persistent diarrhea  And hemorrhoids.  You may end up needing another colonoscopy, but the Xarelto would need to be suspended.  Mammogram ordered,  You have the appointment    Health Maintenance for Postmenopausal Women Menopause is a normal process in which your reproductive ability comes to an end. This process happens gradually over a span of months to years, usually between the ages of 68 and 36. Menopause is complete when you have missed 12 consecutive menstrual periods. It is important to talk with your health care provider about some of the most common conditions that affect postmenopausal women, such as heart disease, cancer, and bone loss (osteoporosis). Adopting a healthy lifestyle and getting preventive care can help to promote your health and wellness. Those actions can also lower your chances of developing some of these common conditions. What should I know about menopause? During menopause, you may experience a number of symptoms, such as:  Moderate-to-severe hot flashes.  Night sweats.  Decrease in sex drive.  Mood swings.  Headaches.  Tiredness.  Irritability.  Memory problems.  Insomnia. Choosing to treat or not to treat menopausal changes is an individual decision that you make with your health care provider. What should I know about hormone replacement therapy and supplements? Hormone therapy products are effective for treating symptoms that are associated with menopause, such as hot flashes and night sweats. Hormone replacement carries  certain risks, especially as you become older. If you are thinking about using estrogen or estrogen with progestin treatments, discuss the benefits and risks with your health care provider. What should I know about heart disease and stroke? Heart disease, heart attack, and stroke become more likely as you age. This may be due, in part, to the hormonal changes that your body experiences during menopause. These can affect how your body processes dietary fats, triglycerides, and cholesterol. Heart attack and stroke are both medical emergencies. There are many things that you can do to help prevent heart disease and stroke:  Have your blood pressure checked at least every 1-2 years. High blood pressure causes heart disease and increases the risk of stroke.  If you are 65-53 years old, ask your health care provider if you should take aspirin to prevent a heart attack or a stroke.  Do not use any tobacco products, including cigarettes, chewing tobacco, or electronic cigarettes. If you need help quitting, ask your health care provider.  It is important to eat a healthy diet and maintain a healthy weight.  Be sure to include plenty of vegetables, fruits, low-fat dairy products, and lean protein.  Avoid eating foods that are high in solid fats, added sugars, or salt (sodium).  Get regular exercise. This is one of the most important things that you can do for your health.  Try to exercise for at least 150 minutes each week. The type of exercise that you do should increase your heart rate and make you sweat. This is known as moderate-intensity exercise.  Try to do strengthening exercises at least twice each week. Do these in addition to the moderate-intensity exercise.  Know your numbers.Ask your health care provider to check your cholesterol and your blood glucose. Continue to have your blood tested as directed by your health care provider. What should I know about cancer screening? There are several  types of cancer. Take the following steps to reduce your risk and to catch any cancer development as early as possible. Breast Cancer  Practice breast self-awareness.  This means understanding how your breasts normally appear and feel.  It also means doing regular breast self-exams. Let your health care provider know about any changes, no matter how small.  If you are 69 or older, have a clinician do a breast exam (clinical breast exam or CBE) every year. Depending on your age, family history, and medical history, it may be recommended that you also have a yearly breast X-ray (mammogram).  If you have a family history of breast cancer, talk with your health care provider about genetic screening.  If you are at high risk for breast cancer, talk with your health care provider about having an MRI and a mammogram every year.  Breast cancer (BRCA) gene test is recommended for women who have family members with BRCA-related cancers. Results of the assessment will determine the need for genetic counseling and BRCA1 and for BRCA2 testing. BRCA-related cancers include these types:  Breast. This occurs in males or females.  Ovarian.  Tubal. This may also be called fallopian tube cancer.  Cancer of the abdominal or pelvic lining (peritoneal cancer).  Prostate.  Pancreatic. Cervical, Uterine, and Ovarian Cancer  Your health care provider may recommend that you be screened regularly for cancer of the pelvic organs. These include your ovaries, uterus, and vagina. This screening involves a pelvic exam, which includes checking for microscopic changes to the surface of your cervix (Pap test).  For women ages 21-65, health care providers may recommend a pelvic exam and a Pap test every three years. For women ages 97-65, they may recommend the Pap test and pelvic exam, combined with testing for human papilloma virus (HPV), every five years. Some types of HPV increase your risk of cervical cancer. Testing  for HPV may also be done on women of any age who have unclear Pap test results.  Other health care providers may not recommend any screening for nonpregnant women who are considered low risk for pelvic cancer and have no symptoms. Ask your health care provider if a screening pelvic exam is right for you.  If you have had past treatment for cervical cancer or a condition that could lead to cancer, you need Pap tests and screening for cancer for at least 20 years after your treatment. If Pap tests have been discontinued for you, your risk factors (such as having a new sexual partner) need to be reassessed to determine if you should start having screenings again. Some women have medical problems that increase the chance of getting cervical cancer. In these cases, your health care provider may recommend that you have screening and Pap tests more often.  If you have a family history of uterine cancer or ovarian cancer, talk with your health care provider about genetic screening.  If you have vaginal bleeding after reaching menopause, tell your health care provider.  There are currently no reliable tests available to screen for ovarian cancer. Lung Cancer  Lung cancer screening is recommended for adults 37-66 years old who are at  high risk for lung cancer because of a history of smoking. A yearly low-dose CT scan of the lungs is recommended if you:  Currently smoke.  Have a history of at least 30 pack-years of smoking and you currently smoke or have quit within the past 15 years. A pack-year is smoking an average of one pack of cigarettes per day for one year. Yearly screening should:  Continue until it has been 15 years since you quit.  Stop if you develop a health problem that would prevent you from having lung cancer treatment. Colorectal Cancer  This type of cancer can be detected and can often be prevented.  Routine colorectal cancer screening usually begins at age 48 and continues through  age 50.  If you have risk factors for colon cancer, your health care provider may recommend that you be screened at an earlier age.  If you have a family history of colorectal cancer, talk with your health care provider about genetic screening.  Your health care provider may also recommend using home test kits to check for hidden blood in your stool.  A small camera at the end of a tube can be used to examine your colon directly (sigmoidoscopy or colonoscopy). This is done to check for the earliest forms of colorectal cancer.  Direct examination of the colon should be repeated every 5-10 years until age 15. However, if early forms of precancerous polyps or small growths are found or if you have a family history or genetic risk for colorectal cancer, you may need to be screened more often. Skin Cancer  Check your skin from head to toe regularly.  Monitor any moles. Be sure to tell your health care provider:  About any new moles or changes in moles, especially if there is a change in a mole's shape or color.  If you have a mole that is larger than the size of a pencil eraser.  If any of your family members has a history of skin cancer, especially at a young age, talk with your health care provider about genetic screening.  Always use sunscreen. Apply sunscreen liberally and repeatedly throughout the day.  Whenever you are outside, protect yourself by wearing long sleeves, pants, a wide-brimmed hat, and sunglasses. What should I know about osteoporosis? Osteoporosis is a condition in which bone destruction happens more quickly than new bone creation. After menopause, you may be at an increased risk for osteoporosis. To help prevent osteoporosis or the bone fractures that can happen because of osteoporosis, the following is recommended:  If you are 57-59 years old, get at least 1,000 mg of calcium and at least 600 mg of vitamin D per day.  If you are older than age 21 but younger than age  29, get at least 1,200 mg of calcium and at least 600 mg of vitamin D per day.  If you are older than age 32, get at least 1,200 mg of calcium and at least 800 mg of vitamin D per day. Smoking and excessive alcohol intake increase the risk of osteoporosis. Eat foods that are rich in calcium and vitamin D, and do weight-bearing exercises several times each week as directed by your health care provider. What should I know about how menopause affects my mental health? Depression may occur at any age, but it is more common as you become older. Common symptoms of depression include:  Low or sad mood.  Changes in sleep patterns.  Changes in appetite or eating patterns.  Feeling an overall lack of motivation or enjoyment of activities that you previously enjoyed.  Frequent crying spells. Talk with your health care provider if you think that you are experiencing depression. What should I know about immunizations? It is important that you get and maintain your immunizations. These include:  Tetanus, diphtheria, and pertussis (Tdap) booster vaccine.  Influenza every year before the flu season begins.  Pneumonia vaccine.  Shingles vaccine. Your health care provider may also recommend other immunizations. This information is not intended to replace advice given to you by your health care provider. Make sure you discuss any questions you have with your health care provider. Document Released: 09/15/2005 Document Revised: 02/11/2016 Document Reviewed: 04/27/2015 Elsevier Interactive Patient Education  2017 [employer].

## 2017-01-03 NOTE — Telephone Encounter (Signed)
Diltiazem 30 mg as needed for fast heart rates. Refill sent in.

## 2017-01-03 NOTE — Progress Notes (Addendum)
Patient ID: Katherine Hudson, female    DOB: October 10, 1945  Age: 71 y.o. MRN: [id number]  The patient is here for annual wellness examination, d follow up and management of other chronic and acute problems.  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     The risk factors are reflected in the social history.  The roster of all physicians providing medical care to patient - is listed in the Snapshot section of the chart.  Activities of daily living:  The patient is 100% independent in all ADLs: dressing, toileting, feeding as well as independent mobility  Home safety : The patient has smoke detectors in the home. They wear seatbelts.  There are no firearms at home. There is no violence in the home.   There is no risks for hepatitis, STDs or HIV. There is no   history of blood transfusion. They have no travel history to infectious disease endemic areas of the world.  The patient has seen their dentist in the last six month. They have seen their eye doctor in the last year. 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 .  They have deferred audiologic testing in the last year.  They do not  have excessive sun exposure. Discussed the need for sun protection: hats, long sleeves and use of sunscreen if there is significant sun exposure.   Diet: the importance of a healthy diet is discussed. They do have a healthy diet.  The benefits of regular aerobic exercise were discussed. She does not exercise  Regularly    Depression screen: there are no signs or vegative symptoms of depression- irritability, change in appetite, anhedonia, sadness/tearfullness.  Cognitive assessment: the patient manages all their financial and personal affairs and is actively engaged. They could relate day,date,year and events; recalled  2/3 objects at 3 minutes; performed clock-face test normally.  The following portions of the patient's history were reviewed and updated as appropriate: allergies, current medications, past family history, past medical history,  past surgical history, past social history  and problem list.  Visual acuity was not assessed per patient preference since she has regular follow up with her ophthalmologist. Hearing and body mass index were assessed and reviewed.   During the course of the visit the patient was educated and counseled about appropriate screening and preventive services including : fall prevention , diabetes screening, nutrition counseling, colorectal cancer screening, and recommended immunizations.    CC: The primary encounter diagnosis was Screening breast examination. Diagnoses of Prediabetes, Colitis, Diarrhea, functional, Atypical atrial flutter ([place]), Essential hypertension, Mixed hyperlipidemia, History of Clostridium difficile colitis, Chest pain, unspecified type, Encounter for preventive health examination, and Hypothyroidism due to acquired atrophy of thyroid were also pertinent to this visit.   Last seen in July 2017,  Multiple unresolved issues were addressed.  She continues to have post prandial diarrhea several times daily.  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   Labs at that time were significant for a1c of 5.9 and she was cautioned to lose weight with regular participation in aerobic exercise  and adherence to a low GI diet and asked to return in 6 months. Patient  never received the information despite multiple attemptts followed by mail .    She was evaluated in last month ay by Katherine Hudson for a 6 month history of intermitted palpitations accompanied by chest pain (negative ER visit for same with presyncope in  April 2018) . Atrial flutter was diagnosed and patient  Was referred to Dr Katherine Hudson and Katherine Hudson. Lisinopril was discontinued and  Digoxin and diltiazem Er 120 mg  and Xarelto were  Started for CHADSVASc score of 3 (age, gender, HTN). However, digoxin and dilt were  stopped due to activity limiting bradycardia, and she was prescribed short acting diltiazem  to use prn  .  An ablation is scheduled for June 13      History Katherine Hudson has a past medical history of Depression; Hyperlipidemia; and Hypertension.   She has a past surgical history that includes Appendectomy; Knee surgery (2000); and Rotator cuff repair (1998).   Her family history includes Cancer in her father and mother; Stroke in her sister.She reports that she has quit smoking. She quit after 10.00 years of use. She has never used smokeless tobacco. She reports that she drinks alcohol. She reports that she does not use drugs.  Outpatient Medications Prior to Visit  Medication Sig Dispense Refill  . Cholecalciferol (D3-1000 PO) Take 1 tablet by mouth daily.    Marland Kitchen CINNAMON PO Take 2,000 mg by mouth daily.    . Coenzyme Q10 (CO Q 10 PO) Take 200 mg by mouth daily.    . Cranberry 200 MG CAPS Take by mouth daily.    . fluticasone (FLONASE) 50 MCG/ACT nasal spray Place 2 sprays into the nose daily as needed. 16 g 5  . KRILL OIL PO Take by mouth daily.    . Multiple Vitamin (MULTIVITAMIN) tablet Take 1 tablet by mouth daily.      . Naproxen Sodium (ALEVE PO) Take 500 mg by mouth as needed.    . NON FORMULARY Mega Red Takes 1 tablet by mouth daily.    . NON FORMULARY Cannabis oil QD PRN    . oxybutynin (OXYTROL) 3.9 MG/24HR Place 1 patch onto the skin as directed.    . Probiotic Product (PROBIOTIC PO) Take 1 tablet by mouth daily.    . rivaroxaban (XARELTO) 20 MG TABS tablet Take 1 tablet (20 mg total) by mouth daily with supper. 30 tablet 6  . sertraline (ZOLOFT) 50 MG tablet TAKE THREE TABLETS BY MOUTH ONCE DAILY 270 tablet 1  . simvastatin  (ZOCOR) 40 MG tablet Take 1 tablet (40 mg total) by mouth at bedtime. 90 tablet 0  . TURMERIC PO Take 1 capsule by mouth daily.    Marland Kitchen diltiazem (CARDIZEM) 30 MG tablet Take one tablet (30 mg) every 8 hours as needed for fast heart rates (Patient not taking: Reported on 01/03/2017) 90 tablet 0   No facility-administered medications prior to visit.     Review of Systems   Patient denies headache, fevers, malaise, unintentional weight loss, skin rash, eye pain, sinus congestion and sinus pain, sore throat, dysphagia,  hemoptysis , cough, dyspnea, wheezing, chest pain,, orthopnea, edema, abdominal pain, nausea, melena, , constipation, flank pain, dysuria, hematuria, urinary  Frequency, nocturi, numbness, tingling, seizures,  Focal weakness, Loss of consciousness,  Tremor, insomnia, depression, anxiety, and suicidal ideation.      Objective:  BP 132/74 (BP Location: Left Arm, Patient Position: Sitting, Cuff Size: Normal)   Pulse (!) 58   Temp 98.1 F (36.7 C) (Oral)   Resp 15  Ht _0  (1.676 m)   Wt 182 lb 12.8 oz (82.9 kg)   SpO2 97%   BMI 29.50 kg/m   Physical Exam   General appearance: alert, cooperative and appears stated age Head: Normocephalic, without obvious abnormality, atraumatic Eyes: conjunctivae/corneas clear. PERRL, EOM's intact. Fundi benign. Ears: normal TM's and external ear canals both ears Nose: Nares normal. Septum midline. Mucosa normal. No drainage or sinus tenderness. Throat: lips, mucosa, and tongue normal; teeth and gums normal Neck: no adenopathy, no carotid bruit, no JVD, supple, symmetrical, trachea midline and thyroid not enlarged, symmetric, no tenderness/mass/nodules Lungs: clear to auscultation bilaterally Breasts: normal appearance, no masses or tenderness Heart: regular rate and rhythm, S1, S2 normal, no murmur, click, rub or gallop Abdomen: soft, non-tender; bowel sounds normal; no masses,  no organomegaly Extremities: extremities normal,  atraumatic, no cyanosis or edema Pulses: 2+ and symmetric Skin: Skin color, texture, turgor normal. No rashes or lesions Neurologic: Alert and oriented X 3, normal strength and tone. Normal symmetric reflexes. Normal coordination and gait.      Assessment & Plan:   Problem List Items Addressed This Visit    Prediabetes    Her A1c is again elevated but not diagnostic of diabetes .  I recommend she follow a low glycemic index diet and particpate regularly in an aerobic  exercise activity.  We should check an A1c in 6 months.       Relevant Orders   Hemoglobin A1c (Completed)   Lipid panel (Completed)   Mixed hyperlipidemia    LDL but not triglycerides are at goal on current medications. She  has no side effects and liver enzymes are normal. No changes today. LowGI diet and exercise recommended,    Lab Results  Component Value Date   CHOL 159 01/03/2017   HDL 34.00 (L) 01/03/2017   LDLCALC 75 03/06/2016   LDLDIRECT 85.0 01/03/2017   TRIG 259.0 (H) 01/03/2017   CHOLHDL 5 01/03/2017   Lab Results  Component Value Date   ALT 14 12/25/2016   AST 17 12/25/2016   ALKPHOS 54 12/25/2016   BILITOT 0.7 12/25/2016           Relevant Medications   lisinopril (PRINIVIL,ZESTRIL) 40 MG tablet   Hypothyroidism due to acquired atrophy of thyroid    Thyroid function is WNL on current dose.  No current changes needed.   Lab Results  Component Value Date   TSH 1.92 12/11/2016         History of Clostridium difficile colitis    Last year's evaluatio of stool was negative for signs of recurrent C dif or infectious causes. Continue lifelong probiotic      Essential hypertension    Previously on lisinopril which was stopped  In May when diltiazem ER was started and resumed when it was stopped.  No changes today  Lab Results  Component Value Date   CREATININE 0.87 12/25/2016   Lab Results  Component Value Date   NA 139 12/25/2016   K 4.2 12/25/2016   CL 108 12/25/2016   CO2  24 12/25/2016         Relevant Medications   lisinopril (PRINIVIL,ZESTRIL) 40 MG tablet   Encounter for preventive health examination    Annual comprehensive preventive exam was done as well as an evaluation and management of chronic conditions .  During the course of the visit the patient was educated and counseled about appropriate screening and preventive services including :  diabetes screening, lipid analysis with  projected  10 year  risk for CAD , nutrition counseling, breast, cervical and colorectal cancer screening, and recommended immunizations.  Printed recommendations for health maintenance screenings was given        Diarrhea, functional    Persistent,  Since her last infection with c dificile colitis in 2016.  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  Referring to GI for evaluation would start rifaxamin for IBS if other causes ruled out.       Chest pain    Occurring in the setting of atrial flutter , episodic .  ECHO and cardiac cath planned by cardiology/ EP in June .       Atrial flutter ([place])    New onset diagnosed in May 2018 after apparently 6 months of episodic symptoms.  CHADSVASc score is 3,  Continue Xarelto and prn diltiazem. Ablation scheduled for mid June.       Relevant Medications   lisinopril (PRINIVIL,ZESTRIL) 40 MG tablet    Other Visit Diagnoses    Screening breast examination    -  Primary   Relevant Orders   MM SCREENING BREAST TOMO BILATERAL   Colitis       Relevant Orders   Ambulatory referral to Gastroenterology    In addition to the time I spent performing the CPE,  I spent an additional  15 minutes of face to face time with patient more than half of which was spent in counseling patient on the above mentioned issues , reviewing and explaining recent labs and imaging studies done, and coordination of care.   I have discontinued Ms. Siegfried's diltiazem. I am also  having her start on dibucaine. Additionally, I am having her maintain her multivitamin, KRILL OIL PO, fluticasone, sertraline, TURMERIC PO, Probiotic Product (PROBIOTIC PO), Cholecalciferol (D3-1000 PO), simvastatin, CINNAMON PO, Coenzyme Q10 (CO Q 10 PO), Cranberry, NON FORMULARY, Naproxen Sodium (ALEVE PO), oxybutynin, NON FORMULARY, rivaroxaban, lisinopril, and pramoxine.  Meds ordered this encounter  Medications  . lisinopril (PRINIVIL,ZESTRIL) 40 MG tablet    Sig: Take 40 mg by mouth daily.  . pramoxine (PROCTOFOAM) 1 % foam    Sig: Place 1 application rectally 3 (three) times daily as needed for itching.    Dispense:  15 g    Refill:  0  . dibucaine (NUPERCAINAL) 1 % OINT    Sig: Place 1 application rectally as needed for pain.    Dispense:  30 g    Refill:  0    Medications Discontinued During This Encounter  Medication Reason  . diltiazem (CARDIZEM) 30 MG tablet Patient has not taken in last 30 days    Follow-up: No Follow-up on file.   Crecencio Mc, MD

## 2017-01-05 ENCOUNTER — Encounter: Payer: Self-pay | Admitting: *Deleted

## 2017-01-05 NOTE — Telephone Encounter (Signed)
I left a message for Mr. Strehl to call to confirm procedure instructions for the patient.

## 2017-01-06 NOTE — Assessment & Plan Note (Signed)
Persistent,  Since her last infection with c dificile colitis in 2016.  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  Referring to GI for evaluation would start rifaxamin for IBS if other causes ruled out.

## 2017-01-06 NOTE — Assessment & Plan Note (Signed)
Thyroid function is WNL on current dose.  No current changes needed.   Lab Results  Component Value Date   TSH 1.92 12/11/2016

## 2017-01-06 NOTE — Assessment & Plan Note (Signed)
Occurring in the setting of atrial flutter , episodic .  ECHO and cardiac cath planned by cardiology/ EP in June .

## 2017-01-06 NOTE — Assessment & Plan Note (Signed)
Annual comprehensive preventive exam was done as well as an evaluation and management of chronic conditions .  During the course of the visit the patient was educated and counseled about appropriate screening and preventive services including :  diabetes screening, lipid analysis with projected  10 year  risk for CAD , nutrition counseling, breast, cervical and colorectal cancer screening, and recommended immunizations.  Printed recommendations for health maintenance screenings was given 

## 2017-01-06 NOTE — Assessment & Plan Note (Signed)
Last year's evaluatio of stool was negative for signs of recurrent C dif or infectious causes. Continue lifelong probiotic

## 2017-01-06 NOTE — Assessment & Plan Note (Signed)
Her A1c is again elevated but not diagnostic of diabetes .  I recommend she follow a low glycemic index diet and particpate regularly in an aerobic  exercise activity.  We should check an A1c in 6 months.

## 2017-01-06 NOTE — Assessment & Plan Note (Signed)
Previously on lisinopril which was stopped  In May when diltiazem ER was started and resumed when it was stopped.  No changes today  Lab Results  Component Value Date   CREATININE 0.87 12/25/2016   Lab Results  Component Value Date   NA 139 12/25/2016   K 4.2 12/25/2016   CL 108 12/25/2016   CO2 24 12/25/2016

## 2017-01-06 NOTE — Assessment & Plan Note (Signed)
New onset diagnosed in May 2018 after apparently 6 months of episodic symptoms.  CHADSVASc score is 3,  Continue Xarelto and prn diltiazem. Ablation scheduled for mid June.

## 2017-01-06 NOTE — Assessment & Plan Note (Signed)
LDL but not triglycerides are at goal on current medications. She  has no side effects and liver enzymes are normal. No changes today. LowGI diet and exercise recommended,    Lab Results  Component Value Date   CHOL 159 01/03/2017   HDL 34.00 (L) 01/03/2017   LDLCALC 75 03/06/2016   LDLDIRECT 85.0 01/03/2017   TRIG 259.0 (H) 01/03/2017   CHOLHDL 5 01/03/2017   Lab Results  Component Value Date   ALT 14 12/25/2016   AST 17 12/25/2016   ALKPHOS 54 12/25/2016   BILITOT 0.7 12/25/2016

## 2017-01-08 ENCOUNTER — Encounter: Payer: Self-pay | Admitting: Internal Medicine

## 2017-01-09 NOTE — Telephone Encounter (Signed)
I spoke with the patient and confirmed her a-flutter ablation with Dr. Lovena Le on 01/17/17. She is aware a written copy of her instructions will be mailed to her, and that I will clarify with the [facility] office if her labs can be done in the office on 6/8 in the afternoon when she comes for her echo or if she will need to go to the [facility] lab that afternoon.  She is aware to expect a call back tomorrow from [facility] triage to clarify. She voices understanding.

## 2017-01-10 ENCOUNTER — Telehealth: Payer: Self-pay | Admitting: *Deleted

## 2017-01-10 DIAGNOSIS — I4892 Unspecified atrial flutter: Secondary | ICD-10-CM

## 2017-01-10 NOTE — Telephone Encounter (Signed)
-----   Message from Rudi Coco sent at 01/10/2017  9:52 AM EDT ----- We don't draw labs in office after 3pm They would need to go [facility] for that  ----- Message ----- From: Valora Corporal, RN Sent: 01/10/2017   7:18 AM To: [facility]  Would this be possible? If not just let me know.   Thanks, Olin Hauser  ----- Message ----- From: Emily Filbert, RN Sent: 01/09/2017   5:57 PM To: [facility]  This lady is coming in for an echo to be done at 4:00 pm on 01/12/17 in the office there, is it possible to draw her pre-procedure labs for her ablation on 6/13 just before her echo. I told her I would clarify if that was possible or not- if someone could just touch base with her tomorrow to confirm if this can be done in office that afternoon or will she need to go to the hospital lab I would greatly appreciate it.   It would be for a BMP/ CBC only- I didn't place the order because I didn't know which lab.  Thanks!

## 2017-01-10 NOTE — Telephone Encounter (Signed)
Left voicemail message for patient to call back.

## 2017-01-10 NOTE — Telephone Encounter (Signed)
Spoke with patient and let her know that we are not able to do labs here that day. Instructed her to go over to [place] at [facility] and check in at the front desk to have labs done there prior to her scheduled echocardiogram. She was agreeable to this and verbalized understanding with no further questions at this time. Lab orders placed in system and reviewed all information with patient. She verbalized understanding with no further questions at this time.

## 2017-01-12 ENCOUNTER — Other Ambulatory Visit
Admission: RE | Admit: 2017-01-12 | Discharge: 2017-01-12 | Disposition: A | Discharge status: Home / Self Care | Payer: Medicare Other | Source: Ambulatory Visit | Attending: Internal Medicine | Admitting: Internal Medicine

## 2017-01-12 ENCOUNTER — Ambulatory Visit (INDEPENDENT_AMBULATORY_CARE_PROVIDER_SITE_OTHER): Payer: Medicare Other

## 2017-01-12 ENCOUNTER — Other Ambulatory Visit: Payer: Self-pay

## 2017-01-12 DIAGNOSIS — I4892 Unspecified atrial flutter: Secondary | ICD-10-CM

## 2017-01-12 LAB — BASIC METABOLIC PANEL
Anion gap: 9 (ref 5–15)
BUN: 21 mg/dL — AB (ref 6–20)
CHLORIDE: 109 mmol/L (ref 101–111)
CO2: 23 mmol/L (ref 22–32)
CREATININE: 0.87 mg/dL (ref 0.44–1.00)
Calcium: 9.5 mg/dL (ref 8.9–10.3)
GFR calc Af Amer: >60 mL/min (ref >60)
GLUCOSE: 119 mg/dL — AB (ref 65–99)
POTASSIUM: 4.5 mmol/L (ref 3.5–5.1)
Sodium: 141 mmol/L (ref 135–145)

## 2017-01-12 LAB — CBC WITH DIFFERENTIAL/PLATELET
BASOS ABS: 0.1 10*3/uL (ref 0–0.1)
BASOS PCT: 1 %
EOS ABS: 0.2 10*3/uL (ref 0–0.7)
Eosinophils Relative: 3 %
HEMATOCRIT: 42.3 % (ref 35.0–47.0)
HEMOGLOBIN: 14.4 g/dL (ref 12.0–16.0)
Lymphocytes Relative: 21 %
Lymphs Abs: 1.5 10*3/uL (ref 1.0–3.6)
MCH: 27.9 pg (ref 26.0–34.0)
MCHC: 34.1 g/dL (ref 32.0–36.0)
MCV: 81.8 fL (ref 80.0–100.0)
MONOS PCT: 6 %
Monocytes Absolute: 0.4 10*3/uL (ref 0.2–0.9)
NEUTROS ABS: 4.7 10*3/uL (ref 1.4–6.5)
NEUTROS PCT: 69 %
Platelets: 169 10*3/uL (ref 150–440)
RBC: 5.17 MIL/uL (ref 3.80–5.20)
RDW: 14.5 % (ref 11.5–14.5)
WBC: 6.8 10*3/uL (ref 3.6–11.0)

## 2017-01-15 ENCOUNTER — Telehealth: Payer: Self-pay | Admitting: Internal Medicine

## 2017-01-15 NOTE — Telephone Encounter (Signed)
Received incoming call from pt r/t upcoming procedure on 01/17/17 with Dr. Lovena Le for a-flutter ablation. Pt stated she has been on hold for over 2 hours to speak with someone. Pt wanted to know if she should take her Xarelto. Forwarded to Dr. Lovena Le to advise. Dr. Lovena Le recommended pt to take Xarelto on 01/16/17, the evening prior to procedure on 01/17/17. Take next dose, (in the evening, as scheduled) on 01/17/17. Pt stated her HR last was 139. Pt informed she took Cardizem at 11 PM and another one at 2:30 AM. Asked what HR and BP is today. Pt stated she has know way to obtain right now. Pt stated she feel okay now. Reviewed instruction for medication, Cardizem. Informed that the ablation procedure scheduled on 01/17/17 should help with these symptoms. Informed to call our office if symptoms get worse or do not resolve. Pt verbalized understanding

## 2017-01-15 NOTE — Telephone Encounter (Signed)
New message   Has questions about procedure wednesday

## 2017-01-17 ENCOUNTER — Ambulatory Visit (HOSPITAL_COMMUNITY)
Admission: RE | Admit: 2017-01-17 | Discharge: 2017-01-17 | Disposition: A | Discharge status: Home / Self Care | Payer: Medicare Other | Source: Ambulatory Visit | Attending: Internal Medicine | Admitting: Internal Medicine

## 2017-01-17 ENCOUNTER — Encounter (HOSPITAL_COMMUNITY)
Admission: RE | Disposition: A | Discharge status: Home / Self Care | Payer: Self-pay | Source: Ambulatory Visit | Attending: Internal Medicine

## 2017-01-17 DIAGNOSIS — I4892 Unspecified atrial flutter: Secondary | ICD-10-CM | POA: Insufficient documentation

## 2017-01-17 DIAGNOSIS — Z5309 Procedure and treatment not carried out because of other contraindication: Secondary | ICD-10-CM | POA: Insufficient documentation

## 2017-01-17 SURGERY — A-FLUTTER ABLATION

## 2017-01-17 MED ORDER — SODIUM CHLORIDE 0.9 % IV SOLN: INTRAVENOUS | Status: DC

## 2017-01-17 NOTE — Progress Notes (Signed)
Patient has been rescheduled for 02/02/17.    She has been instructed to arrive 2 hours prior (5:30AM) with the same instructions. The patient is counseled on the importance of not missing doses of her Xarelto, she understands  Tommye Standard, PA-C

## 2017-01-17 NOTE — Progress Notes (Signed)
Pt forgot to take her Xarelto yesterday. Last dose was 01-15-17 at 2000. Dr. Lovena Le notified and stat EKG obtained which showed AFlutter. Dr. Lovena Le notified. Awaiting instructions

## 2017-01-18 ENCOUNTER — Ambulatory Visit
Admission: RE | Admit: 2017-01-18 | Discharge: 2017-01-18 | Disposition: A | Discharge status: Home / Self Care | Payer: Medicare Other | Source: Ambulatory Visit | Attending: Internal Medicine | Admitting: Internal Medicine

## 2017-01-18 ENCOUNTER — Other Ambulatory Visit: Payer: Self-pay

## 2017-01-18 ENCOUNTER — Telehealth: Payer: Self-pay | Admitting: Cardiovascular Disease

## 2017-01-18 DIAGNOSIS — Z1231 Encounter for screening mammogram for malignant neoplasm of breast: Secondary | ICD-10-CM | POA: Diagnosis not present

## 2017-01-18 DIAGNOSIS — Z1239 Encounter for other screening for malignant neoplasm of breast: Secondary | ICD-10-CM

## 2017-01-18 MED ORDER — DILTIAZEM HCL 30 MG PO TABS: 30.0000 mg | ORAL_TABLET | Freq: Two times a day (BID) | ORAL | 3 refills | Status: DC | PRN

## 2017-01-18 NOTE — Telephone Encounter (Signed)
Ok to refill Diltiazem? Request received pharmacy last OV was told to hold medication.

## 2017-01-18 NOTE — Telephone Encounter (Signed)
Pt is requesting instructions for ablation.  Routed to [place] office as we don't have EP in [place] today.

## 2017-01-18 NOTE — Telephone Encounter (Signed)
Please call patient regarding instructions for cardioversion.

## 2017-01-18 NOTE — Telephone Encounter (Signed)
LEFT  VOICE MAIL  THAT  INSTRUCTIONS  ARE  SAME AS PREVIOUS  EXCEPT  DIFFERENT  DATE  AND TIME .Katherine Hudson

## 2017-01-24 ENCOUNTER — Other Ambulatory Visit: Payer: Medicare Other

## 2017-02-02 ENCOUNTER — Ambulatory Visit (HOSPITAL_COMMUNITY)
Admission: RE | Admit: 2017-02-02 | Discharge: 2017-02-02 | Disposition: A | Discharge status: Home / Self Care | Payer: Medicare Other | Source: Ambulatory Visit | Attending: Internal Medicine | Admitting: Internal Medicine

## 2017-02-02 ENCOUNTER — Encounter (HOSPITAL_COMMUNITY): Payer: Self-pay | Admitting: Internal Medicine

## 2017-02-02 ENCOUNTER — Telehealth: Payer: Self-pay | Admitting: *Deleted

## 2017-02-02 ENCOUNTER — Other Ambulatory Visit: Payer: Self-pay | Admitting: Physician Assistant

## 2017-02-02 ENCOUNTER — Encounter (HOSPITAL_COMMUNITY)
Admission: RE | Disposition: A | Discharge status: Home / Self Care | Payer: Self-pay | Source: Ambulatory Visit | Attending: Internal Medicine

## 2017-02-02 DIAGNOSIS — R001 Bradycardia, unspecified: Secondary | ICD-10-CM | POA: Insufficient documentation

## 2017-02-02 DIAGNOSIS — Z87891 Personal history of nicotine dependence: Secondary | ICD-10-CM | POA: Diagnosis not present

## 2017-02-02 DIAGNOSIS — Z91040 Latex allergy status: Secondary | ICD-10-CM | POA: Insufficient documentation

## 2017-02-02 DIAGNOSIS — I480 Paroxysmal atrial fibrillation: Secondary | ICD-10-CM

## 2017-02-02 DIAGNOSIS — F329 Major depressive disorder, single episode, unspecified: Secondary | ICD-10-CM | POA: Diagnosis not present

## 2017-02-02 DIAGNOSIS — Z880 Allergy status to penicillin: Secondary | ICD-10-CM | POA: Insufficient documentation

## 2017-02-02 DIAGNOSIS — Z79899 Other long term (current) drug therapy: Secondary | ICD-10-CM | POA: Insufficient documentation

## 2017-02-02 DIAGNOSIS — I483 Typical atrial flutter: Secondary | ICD-10-CM | POA: Insufficient documentation

## 2017-02-02 DIAGNOSIS — Z7901 Long term (current) use of anticoagulants: Secondary | ICD-10-CM | POA: Diagnosis not present

## 2017-02-02 DIAGNOSIS — I4892 Unspecified atrial flutter: Secondary | ICD-10-CM

## 2017-02-02 DIAGNOSIS — E785 Hyperlipidemia, unspecified: Secondary | ICD-10-CM | POA: Insufficient documentation

## 2017-02-02 DIAGNOSIS — Z885 Allergy status to narcotic agent status: Secondary | ICD-10-CM | POA: Insufficient documentation

## 2017-02-02 DIAGNOSIS — I4430 Unspecified atrioventricular block: Secondary | ICD-10-CM | POA: Insufficient documentation

## 2017-02-02 DIAGNOSIS — I10 Essential (primary) hypertension: Secondary | ICD-10-CM | POA: Diagnosis not present

## 2017-02-02 HISTORY — PX: A-FLUTTER ABLATION: EP1230

## 2017-02-02 LAB — BASIC METABOLIC PANEL
Anion gap: 9 (ref 5–15)
BUN: 30 mg/dL — AB (ref 6–20)
CALCIUM: 9 mg/dL (ref 8.9–10.3)
CO2: 21 mmol/L — ABNORMAL LOW (ref 22–32)
CREATININE: 1.25 mg/dL — AB (ref 0.44–1.00)
Chloride: 109 mmol/L (ref 101–111)
GFR calc Af Amer: 49 mL/min — ABNORMAL LOW (ref >60)
GFR, EST NON AFRICAN AMERICAN: 42 mL/min — AB (ref >60)
Glucose, Bld: 107 mg/dL — ABNORMAL HIGH (ref 65–99)
Potassium: 3.9 mmol/L (ref 3.5–5.1)
SODIUM: 139 mmol/L (ref 135–145)

## 2017-02-02 LAB — CBC
HCT: 44.2 % (ref 36.0–46.0)
Hemoglobin: 14.5 g/dL (ref 12.0–15.0)
MCH: 27.4 pg (ref 26.0–34.0)
MCHC: 32.8 g/dL (ref 30.0–36.0)
MCV: 83.6 fL (ref 78.0–100.0)
PLATELETS: 168 10*3/uL (ref 150–400)
RBC: 5.29 MIL/uL — ABNORMAL HIGH (ref 3.87–5.11)
RDW: 14.2 % (ref 11.5–15.5)
WBC: 6.5 10*3/uL (ref 4.0–10.5)

## 2017-02-02 SURGERY — A-FLUTTER ABLATION

## 2017-02-02 MED ORDER — MELATONIN 3 MG PO TABS
9.0000 mg | ORAL_TABLET | Freq: Every evening | ORAL | Status: DC | PRN
  Filled 2017-02-02: qty 3

## 2017-02-02 MED ORDER — KRILL OIL 500 MG PO CAPS: 500.0000 mg | ORAL_CAPSULE | Freq: Every day | ORAL | Status: DC

## 2017-02-02 MED ORDER — MIDAZOLAM HCL 5 MG/5ML IJ SOLN
INTRAMUSCULAR | Status: AC
  Filled 2017-02-02: qty 5

## 2017-02-02 MED ORDER — VITAMIN D 1000 UNITS PO TABS
1000.0000 [IU] | ORAL_TABLET | Freq: Every day | ORAL | Status: DC
  Administered 2017-02-02: 1000 [IU] via ORAL
  Filled 2017-02-02: qty 1

## 2017-02-02 MED ORDER — DILTIAZEM HCL ER 90 MG PO CP12: 90.0000 mg | ORAL_CAPSULE | Freq: Two times a day (BID) | ORAL | 6 refills | Status: DC

## 2017-02-02 MED ORDER — ADULT MULTIVITAMIN W/MINERALS CH
1.0000 | ORAL_TABLET | Freq: Every day | ORAL | Status: DC
  Administered 2017-02-02: 1 via ORAL
  Filled 2017-02-02: qty 1

## 2017-02-02 MED ORDER — SALINE SPRAY 0.65 % NA SOLN
1.0000 | NASAL | Status: DC | PRN
  Filled 2017-02-02: qty 44

## 2017-02-02 MED ORDER — FENTANYL CITRATE (PF) 100 MCG/2ML IJ SOLN
INTRAMUSCULAR | Status: AC
  Filled 2017-02-02: qty 2

## 2017-02-02 MED ORDER — CRANBERRY 200 MG PO CAPS: 200.0000 mg | ORAL_CAPSULE | Freq: Two times a day (BID) | ORAL | Status: DC

## 2017-02-02 MED ORDER — LISINOPRIL 40 MG PO TABS
40.0000 mg | ORAL_TABLET | Freq: Every day | ORAL | Status: DC
  Administered 2017-02-02: 40 mg via ORAL
  Filled 2017-02-02: qty 1

## 2017-02-02 MED ORDER — BUPIVACAINE HCL (PF) 0.25 % IJ SOLN
INTRAMUSCULAR | Status: DC | PRN
  Administered 2017-02-02 (×2): 20 mL

## 2017-02-02 MED ORDER — CO Q-10 100 MG PO CAPS: 100.0000 mg | ORAL_CAPSULE | Freq: Every day | ORAL | Status: DC

## 2017-02-02 MED ORDER — DIPHENHYDRAMINE HCL 2 % EX CREA: 1.0000 "application " | TOPICAL_CREAM | CUTANEOUS | Status: DC | PRN

## 2017-02-02 MED ORDER — RIVAROXABAN 20 MG PO TABS
20.0000 mg | ORAL_TABLET | Freq: Every day | ORAL | Status: DC
  Administered 2017-02-02: 20 mg via ORAL
  Filled 2017-02-02 (×2): qty 1

## 2017-02-02 MED ORDER — OXYBUTYNIN 3.9 MG/24HR TD PTTW: 1.0000 | MEDICATED_PATCH | Freq: Once | TRANSDERMAL | Status: DC

## 2017-02-02 MED ORDER — FLECAINIDE ACETATE 50 MG PO TABS: 50.0000 mg | ORAL_TABLET | Freq: Two times a day (BID) | ORAL | 6 refills | Status: DC

## 2017-02-02 MED ORDER — SODIUM CHLORIDE 0.9% FLUSH
3.0000 mL | Freq: Two times a day (BID) | INTRAVENOUS | Status: DC
  Administered 2017-02-02: 3 mL via INTRAVENOUS

## 2017-02-02 MED ORDER — SODIUM CHLORIDE 0.9 % IV SOLN: 250.0000 mL | INTRAVENOUS | Status: DC | PRN

## 2017-02-02 MED ORDER — SIMVASTATIN 40 MG PO TABS: 40.0000 mg | ORAL_TABLET | Freq: Every day | ORAL | Status: DC

## 2017-02-02 MED ORDER — DILTIAZEM HCL ER 90 MG PO CP12
90.0000 mg | ORAL_CAPSULE | Freq: Two times a day (BID) | ORAL | Status: DC
  Administered 2017-02-02: 90 mg via ORAL
  Filled 2017-02-02 (×2): qty 1

## 2017-02-02 MED ORDER — BUPIVACAINE HCL (PF) 0.25 % IJ SOLN
INTRAMUSCULAR | Status: AC
  Filled 2017-02-02: qty 60

## 2017-02-02 MED ORDER — MIDAZOLAM HCL 5 MG/5ML IJ SOLN
INTRAMUSCULAR | Status: DC | PRN
  Administered 2017-02-02: 1 mg via INTRAVENOUS
  Administered 2017-02-02: 2 mg via INTRAVENOUS
  Administered 2017-02-02 (×4): 1 mg via INTRAVENOUS

## 2017-02-02 MED ORDER — FLECAINIDE ACETATE 50 MG PO TABS
50.0000 mg | ORAL_TABLET | Freq: Two times a day (BID) | ORAL | Status: DC
  Administered 2017-02-02: 50 mg via ORAL
  Filled 2017-02-02 (×2): qty 1

## 2017-02-02 MED ORDER — SODIUM CHLORIDE 0.9 % IV SOLN
INTRAVENOUS | Status: DC
  Administered 2017-02-02: 07:00:00 via INTRAVENOUS

## 2017-02-02 MED ORDER — HEPARIN (PORCINE) IN NACL 2-0.9 UNIT/ML-% IJ SOLN
INTRAMUSCULAR | Status: AC | PRN
  Administered 2017-02-02: 500 mL

## 2017-02-02 MED ORDER — SODIUM CHLORIDE 0.9% FLUSH: 3.0000 mL | INTRAVENOUS | Status: DC | PRN

## 2017-02-02 MED ORDER — FENTANYL CITRATE (PF) 100 MCG/2ML IJ SOLN
INTRAMUSCULAR | Status: DC | PRN
  Administered 2017-02-02 (×3): 12.5 ug via INTRAVENOUS
  Administered 2017-02-02: 25 ug via INTRAVENOUS

## 2017-02-02 MED ORDER — ONDANSETRON HCL 4 MG/2ML IJ SOLN: 4.0000 mg | Freq: Four times a day (QID) | INTRAMUSCULAR | Status: DC | PRN

## 2017-02-02 SURGICAL SUPPLY — 13 items
BAG SNAP BAND KOVER 36X36 (MISCELLANEOUS) ×3 IMPLANT
BLANKET WARM UNDERBOD FULL ACC (MISCELLANEOUS) ×3 IMPLANT
CATH BLAZERPRIME XP (ABLATOR) ×3 IMPLANT
CATH HEX JOSEPH 2-5-2 65CM 6F (CATHETERS) ×3 IMPLANT
CATH JOSEPHSON QUAD-ALLRED 6FR (CATHETERS) ×3 IMPLANT
CATH POLARIS X 2.5/5/2.5 DECAP (CATHETERS) ×3 IMPLANT
PACK EP LATEX FREE (CUSTOM PROCEDURE TRAY) ×2
PACK EP LF (CUSTOM PROCEDURE TRAY) ×1 IMPLANT
PAD DEFIB LIFELINK (PAD) ×3 IMPLANT
SHEATH PINNACLE 6F 10CM (SHEATH) ×3 IMPLANT
SHEATH PINNACLE 7F 10CM (SHEATH) ×3 IMPLANT
SHEATH PINNACLE 8F 10CM (SHEATH) ×6 IMPLANT
SHIELD RADPAD SCOOP 12X17 (MISCELLANEOUS) ×3 IMPLANT

## 2017-02-02 NOTE — Discharge Instructions (Signed)
No driving for 4 days. No lifting over 5 lbs for 1 week. No vigorous or sexual activity for 1 week. You may return to work on 02/08/17. Keep procedure site clean & dry. If you notice increased pain, swelling, bleeding or pus, call/return!  You may shower, but no soaking baths/hot tubs/pools for 1 week.      Information on my medicine - XARELTO (Rivaroxaban)  This medication education was reviewed with me or my healthcare representative as part of my discharge preparation.  The pharmacist that spoke with me during my hospital stay was:  Carlean Jews, [facility]  Why was Xarelto prescribed for you? Xarelto was prescribed for you to reduce the risk of a blood clot forming that can cause a stroke if you have a medical condition called atrial fibrillation (a type of irregular heartbeat).  What do you need to know about xarelto ? Take your Xarelto ONCE DAILY at the same time every day with your evening meal. If you have difficulty swallowing the tablet whole, you may crush it and mix in applesauce just prior to taking your dose.  Take Xarelto exactly as prescribed by your doctor and DO NOT stop taking Xarelto without talking to the doctor who prescribed the medication.  Stopping without other stroke prevention medication to take the place of Xarelto may increase your risk of developing a clot that causes a stroke.  Refill your prescription before you run out.  After discharge, you should have regular check-up appointments with your healthcare provider that is prescribing your Xarelto.  In the future your dose may need to be changed if your kidney function or weight changes by a significant amount.  What do you do if you miss a dose? If you are taking Xarelto ONCE DAILY and you miss a dose, take it as soon as you remember on the same day then continue your regularly scheduled once daily regimen the next day. Do not take two doses of Xarelto at the same time or on the same day.   Important  Safety Information A possible side effect of Xarelto is bleeding. You should call your healthcare provider right away if you experience any of the following: ? Bleeding from an injury or your nose that does not stop. ? Unusual colored urine (red or dark brown) or unusual colored stools (red or black). ? Unusual bruising for unknown reasons. ? A serious fall or if you hit your head (even if there is no bleeding).  Some medicines may interact with Xarelto and might increase your risk of bleeding while on Xarelto. To help avoid this, consult your healthcare provider or pharmacist prior to using any new prescription or non-prescription medications, including herbals, vitamins, non-steroidal anti-inflammatory drugs (NSAIDs) and supplements.  This website has more information on Xarelto: [email or web address]

## 2017-02-02 NOTE — Progress Notes (Deleted)
Cardiology Office Note  Date:  02/02/2017   ID:  Katherine, Hudson 18-Apr-1946, MRN [id number]  PCP:  Crecencio Mc, MD   No chief complaint on file.   HPI:  71 year old woman with history of  depression,  hypertension,  Hyperlipidemia Atrial flutter Invasive EP study and catheter ablation of atrial flutter  Presenting for f/u of symptoms of chest pain and palpitations  On today's visit she reports having tachycardia that started this morning Had normal pulse rate last night, rapid pulse rate today  this is similar to the palpitations she has been appreciating on and off for the past 6 months  When she does have tachycardia, she does have some occasional chest tightness, worse on exertion associated with some shortness of breath Denies any leg swelling  Denies having any discomfort in her chest in the office today, She appears relaxed, no significant shortness of breath on exam  Symptoms started back when she was in [place], often on Since she has been home symptoms again off and on  No rhyme or reason, comes on at rest and sometimes with exertion   In April went to the emergency room , noted to be in normal sinus rhythm   Workup negative  Lisinopril recently increased up to 40 mg daily for hypertension   Denies  numbness or tingling radiating to left arm or jaw, dizziness, frequent headaches, changes in vision.   OSA- tested 3-4 years and does not have per patient.   11/28/16 Went to ED for Chest pain, palpitations.  Negative troponins. Normal EKG.  12 days of chest pain, felt like a squeezing, intermittent Worse with exertion, mild shortness of breath  [place], felt like she was going to pass out Workup in the emergency room was unrevealing  EKG personally reviewed by myself on todays visit Shows atrial flutter with ventricular rate 140 bpm no significant ST or T-wave changes   PMH:   has a past medical history of Depression; Hyperlipidemia; and  Hypertension.  PSH:    Past Surgical History:  Procedure Laterality Date  . APPENDECTOMY    . KNEE SURGERY  2000   right  . [place]   right    No current facility-administered medications for this visit.    No current outpatient prescriptions on file.   Facility-Administered Medications Ordered in Other Visits  Medication Dose Route Frequency Provider Last Rate Last Dose  . diltiazem (CARDIZEM SR) 12 hr capsule 90 mg  90 mg Oral Q12H Baldwin [place], PA-C         Allergies:   Penicillins; Percocet [oxycodone-acetaminophen]; and Latex   Social History:  The patient  reports that she has quit smoking. She quit after 10.00 years of use. She has never used smokeless tobacco. She reports that she drinks alcohol. She reports that she does not use drugs.   Family History:   family history includes Cancer in her father and mother; Stroke in her sister.    Review of Systems: Review of Systems  Constitutional: Negative.   Respiratory: Positive for shortness of breath.   Cardiovascular: Positive for palpitations.       Tachycardia  Gastrointestinal: Negative.   Musculoskeletal: Negative.   Neurological: Negative.   Psychiatric/Behavioral: Negative.   All other systems reviewed and are negative.    PHYSICAL EXAM: VS:  There were no vitals taken for this visit. , BMI There is no height or weight on file to calculate BMI. GEN: Well nourished,  well developed, in no acute distress obese HEENT: normal  Neck: no JVD, carotid bruits, or masses Cardiac: Regular rate, tachycardic,  no murmurs, rubs, or gallops,no edema  Respiratory:  clear to auscultation bilaterally, normal work of breathing GI: soft, nontender, nondistended, + BS MS: no deformity or atrophy  Skin: warm and dry, no rash Neuro:  Strength and sensation are intact Psych: euthymic mood, full affect    Recent Labs: 12/11/2016: Magnesium 2.1; TSH 1.92 12/25/2016: ALT 14 02/02/2017: BUN 30;  Creatinine, Ser 1.25; Hemoglobin 14.5; Platelets 168; Potassium 3.9; Sodium 139    Lipid Panel Lab Results  Component Value Date   CHOL 159 01/03/2017   HDL 34.00 (L) 01/03/2017   LDLCALC 75 03/06/2016   TRIG 259.0 (H) 01/03/2017      Wt Readings from Last 3 Encounters:  01/17/17 182 lb (82.6 kg)  01/03/17 182 lb 12.8 oz (82.9 kg)  12/28/16 182 lb (82.6 kg)       ASSESSMENT AND PLAN:  Chest pain, unspecified type - Plan: EKG 12-Lead Chest pain associated with her atrial flutter, likely rate related We'll attempt to restore normal sinus rhythm with medication changes as below We could consider ischemic workup at a later date  Essential hypertension - Plan: EKG 12-Lead Recommended she stop lisinopril Suggested she start diltiazem extended release 120 mg daily Recommended she monitor blood pressure at home for further medication titration Low blood pressure on today's visit likely from underlying tachycardia  Mixed hyperlipidemia - Plan: EKG 12-Lead Cholesterol is at goal on the current lipid regimen. No changes to the medications were made.  Typical atrial flutter (HCC) Invasive EP study and catheter ablation of atrial flutter 02/02/17   Total encounter time more than 15 minutes  Greater than 50% was spent in counseling and coordination of care with the patient   Disposition:   F/U One month    No orders of the defined types were placed in this encounter.    Signed, Esmond Plants, M.D., Ph.D. 02/02/2017  [place], [place]

## 2017-02-02 NOTE — H&P (Signed)
ELECTROPHYSIOLOGY CONSULT NOTE  Patient ID: Katherine Hudson, MRN: [id number], DOB/AGE: [birth year] 71 y.o. 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 female  when asked to see today for atrial flutter.  She's been having tachypalpitations one and off for months. These can last a day or 2 at a time. She's been found to be in atrial flutter. These episodes are associated with rapid rates lightheadedness shortness of breath and chest discomfort. She was started on anticoagulation yesterday.  She has sinus bradycardia. It is not clear as to how limiting this is but denies exertional shortness of breath or chest discomfort.  Thromboembolic risk factors ( age -53, HTN-1, Gender-1) for a CHADSVASc Score of  3         Past Medical History:  Diagnosis Date  . Depression   . Hyperlipidemia   . Hypertension       Surgical History:       Past Surgical History:  Procedure Laterality Date  . APPENDECTOMY    . KNEE SURGERY  2000   right  . ROTATOR CUFF REPAIR  1998   right     Home Meds:        Prior to Admission medications   Medication Sig Start Date End Date Taking? Authorizing Provider  Cholecalciferol (D3-1000 PO) Take 1 tablet by mouth daily.   Yes Katherine Hudson  CINNAMON PO Take 2,000 mg by mouth daily.   Yes Katherine Hudson  Coenzyme Q10 (CO Q 10 PO) Take 200 mg by mouth daily.   Yes Katherine Hudson  Cranberry 200 MG CAPS Take by mouth daily.   Yes Katherine Hudson  digoxin (LANOXIN) 0.25 MG tablet Take 1 tablet (0.25 mg total) by mouth daily. 12/27/16  Yes Katherine Merritts, Hudson  diltiazem (CARDIZEM CD) 120 MG 24 hr capsule Take 1 capsule (120 mg total) by  mouth daily. 12/27/16  Yes Katherine Hudson  fluticasone (FLONASE) 50 MCG/ACT nasal spray Place 2 sprays into the nose daily as needed. 03/27/12  Yes Katherine Hudson  KRILL OIL PO Take by mouth daily.   Yes Katherine Hudson  Multiple Vitamin (MULTIVITAMIN) tablet Take 1 tablet by mouth daily.     Yes Katherine Hudson  Naproxen Sodium (ALEVE PO) Take 500 mg by mouth as needed.   Yes Katherine Hudson  NON FORMULARY Mega Red Takes 1 tablet by mouth daily.   Yes Katherine Hudson  NON FORMULARY Cannabis oil QD PRN   Yes Katherine Hudson  oxybutynin (OXYTROL) 3.9 MG/24HR Place 1 patch onto the skin as directed.   Yes Katherine Hudson  Probiotic Product (PROBIOTIC PO) Take 1 tablet by mouth daily.   Yes Katherine Hudson  rivaroxaban (XARELTO) 20 MG TABS tablet Take 1 tablet (20 mg total) by mouth daily with supper. 12/27/16  Yes Katherine Merritts, Hudson  sertraline (ZOLOFT) 50 MG tablet TAKE THREE TABLETS BY MOUTH ONCE DAILY 11/13/16  Yes Katherine Hudson  simvastatin (ZOCOR) 40 MG tablet Take 1 tablet (40 mg total) by mouth at bedtime. 12/07/16  Yes Katherine Hudson  TURMERIC PO Take 1 capsule by mouth daily.   Yes Provider,  Historical, Hudson    Allergies:       Allergies  Allergen Reactions  . Penicillins     As a child  . Percocet [Oxycodone-Acetaminophen]   . Latex Rash and Other (See Comments)    Knee scabbed over after using bandage    Social History        Social History  . Marital status: Married    Spouse name: N/A  . Number of children: N/A  . Years of education: N/A      Occupational History  . Not on file.         Social History Main Topics  . Smoking status: Former Smoker    Years: 10.00  . Smokeless tobacco: Never Used     Comment: quit 1980  . Alcohol use Yes     Comment: occassionally  . Drug use: No  . Sexual activity: Not on file       Other Topics  Concern  . Not on file      Social History Narrative  . No narrative on file          Family History  Problem Relation Age of Onset  . Cancer Father        lung  . Cancer Mother        cervial  . Stroke Sister      ROS:  Please see the history of present illness.     All other systems reviewed and negative.    Physical Exam: Blood pressure 124/60, pulse (!) 52, height 5\' 6"  (1.676 m), weight 182 lb (82.6 kg). General: Well developed, well nourished female in no acute distress. Head: Normocephalic, atraumatic, sclera non-icteric, no xanthomas, nares are without discharge. EENT: normal  Lymph Nodes:  none Neck: Negative for carotid bruits. JVD not elevated. Back:without scoliosis kyphosis Lungs: Clear bilaterally to auscultation without wheezes, rales, or rhonchi. Breathing is unlabored. Heart: RRR with S1 S2. No murmur . No rubs, or gallops appreciated. Abdomen: Soft, non-tender, non-distended with normoactive bowel sounds. No hepatomegaly. No rebound/guarding. No obvious abdominal masses. Msk:  Strength and tone appear normal for age. Extremities: No clubbing or cyanosis. No edema.  Distal pedal pulses are 2+ and equal bilaterally. Skin: Warm and Dry Neuro: Alert and oriented X 3. CN III-XII intact Grossly normal sensory and motor function . Psych:  Responds to questions appropriately with a normal affect.                 Labs: Cardiac Enzymes RecentLabs(last2labs)  No results for input(s): CKTOTAL, CKMB, TROPONINI in the last 72 hours.   CBC RecentLabs       Lab Results  Component Value Date   WBC 8.5 11/28/2016   HGB 15.6 11/28/2016   HCT 45.5 11/28/2016   MCV 81.7 11/28/2016   PLT 196 11/28/2016     PROTIME: RecentLabs(last2labs)  No results for input(s): LABPROT, INR in the last 72 hours.   Chemistry  LastLabs  Recent Labs Lab 12/25/16 1028  NA 139  K 4.2  CL 108  CO2 24  BUN 18  CREATININE 0.87  CALCIUM 9.5    PROT 7.0  BILITOT 0.7  ALKPHOS 54  ALT 14  AST 17  GLUCOSE 103*     Lipids RecentLabs       Lab Results  Component Value Date   CHOL 153 03/06/2016   HDL 41.60 03/06/2016   LDLCALC 75 03/06/2016   TRIG 185.0 (H) 03/06/2016     BNP LastLabs  No results found for: PROBNP   Thyroid Function Tests:  RecentLabs(last2labs)  No results for input(s): TSH, T4TOTAL, T3FREE, THYROIDAB in the last 72 hours.  Invalid input(s): FREET3   Miscellaneous RecentLabs  No results found for: DDIMER    Radiology/Studies:   ImagingResults  Dg Chest 2 View  Result Date: 11/28/2016 CLINICAL DATA:  Shortness of breath, chest tightness for 12 days, headache EXAM: CHEST  2 VIEW COMPARISON:  Chest x-ray of 02/02/2014 FINDINGS: No active infiltrate or effusion is seen. Mediastinal and hilar contours are unremarkable. The heart is mildly enlarged and stable. There may be a small hiatal hernia present. There are mild degenerative changes in the mid to lower thoracic spine. IMPRESSION: 1. No active lung disease. 2. Probable small hiatal hernia. Electronically Signed   By: Ivar Drape M.D.   On: 11/28/2016 16:49     EKG: Personally reviewed  atrial flutter-atypical with negative flutter waves in lead 2 but also negative flutter  waves in lead V1   Assessment and Plan:  Atrial Flutter atypical  Sinus bradycardia  The patient has recurrent atrial flutter which is somewhat atypical. She has had no instrumentation of her heart. Likely represents typical flutter thus not withstanding. Given her sinus bradycardia, she does not have good medical treatment options. She would like to be considered for ablation.  Given scheduling opportunities, I have discussed with Dr. Elliot Cousin his undertaking this ablation procedure.  I have discussed with her the potential complications and risks of the procedure as well as the fact that it may not be amenable to catheter ablation. We have also  discussed the high likelihood of atrial fibrillation occurring in the wake of ablation.  She needs an echocardiogram to look at LV structure and atrial size.  She appropriately has been started on anticoagulation. I have reminded her to take her Rivaroxaban at night with her main meal  With her sinus bradycardia I have recommended that they discontinue diltiazem and digoxin.       Virl Axe  EP Attending  Patient seen and examined. Agree with above. The patient presents today for EP study and ablation of atrial flutter. I have discussed the treatment options with the patient and she wishes to proceed.  Mikle Bosworth.D.

## 2017-02-02 NOTE — Telephone Encounter (Signed)
Left voicemail message for Trish to call back.

## 2017-02-02 NOTE — Progress Notes (Addendum)
Site area: RFV x 3 Site Prior to Removal:  Level 0 Pressure Applied For:20 min Manual:  yes  Patient Status During Pull:  stable Post Pull Site:  Level 0 Post Pull Instructions Given: yes  Post Pull Pulses Present: palpable Dressing Applied:  tegaderm Bedrest begins @ 0340 till 3524 Comments:RT IJ sheath removed Site level zero. 250cc NS bolus given per V.O. Dr Lovena Le

## 2017-02-02 NOTE — Progress Notes (Signed)
Patient is seen post procedure. She feels well, without complaints of CP, palpitations or SOB Telemetry is SR PACs, VSS R groin and IJ sites are stable Site care and activity restrictions were discussed with the patient Discussed medication changes with the patient and planned stress testing/follow-up out patient  Plan for discharge today once bedrest is completed, after ambulation if procedure sites and patient remains stable.  Tommye Standard, PA-C  Mikle Bosworth.D.

## 2017-02-02 NOTE — Telephone Encounter (Signed)
-----   Message from Katherine Merritts, MD sent at 02/02/2017 12:16 PM EDT ----- We can probably move her apt on Monday She is having an ablation today in [place] Perhaps we can contact trish in [place]? Would deleay a few weeks, she will likely have f/u with Lovena Le thx TG

## 2017-02-05 ENCOUNTER — Ambulatory Visit: Payer: Medicare Other | Admitting: Cardiovascular Disease

## 2017-02-05 NOTE — Telephone Encounter (Signed)
Spoke with Trish on Friday 02/02/17 and she was agreeable that appointment could be moved out since having ablation.

## 2017-02-08 ENCOUNTER — Ambulatory Visit: Payer: Medicare Other

## 2017-02-13 DIAGNOSIS — K529 Noninfective gastroenteritis and colitis, unspecified: Secondary | ICD-10-CM | POA: Diagnosis not present

## 2017-02-14 ENCOUNTER — Telehealth: Payer: Self-pay | Admitting: Radiology

## 2017-02-14 ENCOUNTER — Ambulatory Visit (INDEPENDENT_AMBULATORY_CARE_PROVIDER_SITE_OTHER): Payer: Medicare Other

## 2017-02-14 DIAGNOSIS — I480 Paroxysmal atrial fibrillation: Secondary | ICD-10-CM | POA: Diagnosis not present

## 2017-02-14 NOTE — Telephone Encounter (Signed)
Patient came in to have a flecainide treadmill which was scheduled before her ablation on 6-29. When she arrived she was noted to be in afib at a rate of 115-120. Epic down at time of test unable to review history. Test was cancelled at this time per Dr. Burt Knack (DOD).

## 2017-02-15 NOTE — Telephone Encounter (Signed)
Patient had an atrial flutter ablation on 02/02/17 with Dr. Lovena Le. It appears that she was started on flecainide 50 mg BID around 02/02/17, but not discharge summary to confirm. Will forward to Dr. Caryl Comes to review.

## 2017-02-19 ENCOUNTER — Other Ambulatory Visit: Payer: Self-pay

## 2017-02-19 ENCOUNTER — Telehealth: Payer: Self-pay | Admitting: Internal Medicine

## 2017-02-19 MED ORDER — RIVAROXABAN 20 MG PO TABS: 20.0000 mg | ORAL_TABLET | Freq: Every day | ORAL | 6 refills | Status: DC

## 2017-02-19 MED ORDER — FLECAINIDE ACETATE 50 MG PO TABS: 50.0000 mg | ORAL_TABLET | Freq: Two times a day (BID) | ORAL | 6 refills | Status: DC

## 2017-02-19 MED ORDER — DILTIAZEM HCL 30 MG PO TABS: 30.0000 mg | ORAL_TABLET | Freq: Two times a day (BID) | ORAL | 3 refills | Status: DC | PRN

## 2017-02-19 NOTE — Telephone Encounter (Signed)
°*  STAT* If patient is at the pharmacy, call can be transferred to refill team.   1. Which medications need to be refilled? (please list name of each medication and dose if known)   Diltiazem  Flecainide  Xarelto (generic)   2. Which pharmacy/location (including street and city if local pharmacy) is medication to be sent to? [facility] [address].   3. Do they need a 30 day or 90 day supply? 90 day

## 2017-02-19 NOTE — Telephone Encounter (Signed)
Requested Prescriptions   Signed Prescriptions Disp Refills  . rivaroxaban (XARELTO) 20 MG TABS tablet 30 tablet 6    Sig: Take 1 tablet (20 mg total) by mouth daily with supper.    Authorizing Provider: Minna Merritts    Ordering User: Janan Ridge diltiazem (CARDIZEM) 30 MG tablet 30 tablet 3    Sig: Take 1 tablet (30 mg total) by mouth 2 (two) times daily as needed. For rapid heart beat    Authorizing Provider: Minna Merritts    Ordering User: Janan Ridge flecainide (TAMBOCOR) 50 MG tablet 60 tablet 6    Sig: Take 1 tablet (50 mg total) by mouth every 12 (twelve) hours.    Authorizing Provider: Minna Merritts    Ordering User: Janan Ridge

## 2017-02-21 MED ORDER — FLECAINIDE ACETATE 100 MG PO TABS: 100.0000 mg | ORAL_TABLET | Freq: Two times a day (BID) | ORAL | 3 refills | Status: DC

## 2017-02-21 NOTE — Telephone Encounter (Signed)
Reviewed the patient with Dr. Caryl Comes and Katy's note from 02/14/17 that GXT was cancelled due to patient being in a-fib with HR's 115-120. Orders received to have the patient increase flecainide to 100 mg BID.  I have notified the patient of the above- she cannot tell if she is currently out of rhythm, but states her heart rates are better controlled. She does get SOB with some exertion, but was outside working in her garden this morning.  I have advised her of Dr. Olin Pia recommendations to increase flecainide to 100 mg BID- she is agreeable. She has a GXT scheduled on 02/28/17 and follow up with Dr. Caryl Comes (post flutter ablation ) on 03/08/17.

## 2017-02-21 NOTE — Addendum Note (Signed)
Addended byAlvis Lemmings C on: 02/21/2017 11:40 AM   Modules accepted: Orders

## 2017-02-27 ENCOUNTER — Telehealth: Payer: Self-pay | Admitting: *Deleted

## 2017-02-27 ENCOUNTER — Other Ambulatory Visit: Payer: Self-pay | Admitting: *Deleted

## 2017-02-27 MED ORDER — DILTIAZEM HCL ER 90 MG PO CP12: 90.0000 mg | ORAL_CAPSULE | Freq: Two times a day (BID) | ORAL | 3 refills | Status: DC

## 2017-02-27 NOTE — Telephone Encounter (Signed)
Pharmacy request refill received for Ditiazem 90 mg tablet. Please advise if ok to refill.

## 2017-02-27 NOTE — Telephone Encounter (Signed)
Patient returned call and confirmed appt. She verbalized understanding to wear comfortable walking shoes, no caffeine for 24 hours, bring inhalers if she uses them.

## 2017-02-27 NOTE — Telephone Encounter (Signed)
No answer. Left message as reminder of treadmill stress test tomorrow, 02/28/17 at 10:30 am and to call back to confirm.

## 2017-02-28 ENCOUNTER — Ambulatory Visit: Payer: Medicare Other

## 2017-02-28 ENCOUNTER — Telehealth: Payer: Self-pay | Admitting: Internal Medicine

## 2017-02-28 NOTE — Telephone Encounter (Signed)
Per Dr. Caryl Comes- patient may need to be cardioverted. I spoke with the patient. She states that her heart rates have been fairly well controlled over the last few days. She was 54 bpm at 8:00 am this morning per her report. She states she was surprised to come to the office and see her heart rates were elevated again. She rechecked this when she got home and she was 112 bpm. Reviewed with Dr. Caryl Comes- plan is to see in office tomorrow to see if the patient is still out of rhythm and formulate a plan going forward.  The patient is agreeable.  She will be seen at 9:00 am in the morning.

## 2017-02-28 NOTE — Telephone Encounter (Signed)
Pt in office today for GXT. Confirmed she has been taking increased dose of flecainide, 100mg  BID.  HR 118-130, GXT was not started.    EKGs printed, scanned in to pt's chart and routed to Dr. Caryl Comes.  Have notified Alvis Lemmings, RN, and routed to Dr. Caryl Comes for further review and to advise.

## 2017-03-01 ENCOUNTER — Ambulatory Visit (INDEPENDENT_AMBULATORY_CARE_PROVIDER_SITE_OTHER): Payer: Medicare Other | Admitting: Internal Medicine

## 2017-03-01 ENCOUNTER — Encounter: Payer: Self-pay | Admitting: Internal Medicine

## 2017-03-01 VITALS — BP 94/62 | HR 56 | Ht 66.0 in | Wt 178.5 lb

## 2017-03-01 DIAGNOSIS — I4892 Unspecified atrial flutter: Secondary | ICD-10-CM

## 2017-03-01 DIAGNOSIS — I480 Paroxysmal atrial fibrillation: Secondary | ICD-10-CM | POA: Diagnosis not present

## 2017-03-01 MED ORDER — FLECAINIDE ACETATE 100 MG PO TABS: 100.0000 mg | ORAL_TABLET | Freq: Two times a day (BID) | ORAL | 3 refills | Status: DC

## 2017-03-01 MED ORDER — RIVAROXABAN 20 MG PO TABS: 20.0000 mg | ORAL_TABLET | Freq: Every day | ORAL | 6 refills | Status: DC

## 2017-03-01 MED ORDER — LISINOPRIL 20 MG PO TABS: ORAL_TABLET | ORAL | 3 refills | Status: DC

## 2017-03-01 MED ORDER — DILTIAZEM HCL ER 90 MG PO CP12: ORAL_CAPSULE | ORAL | 3 refills | Status: DC

## 2017-03-01 NOTE — Patient Instructions (Signed)
Medication Instructions: - Your physician has recommended you make the following change in your medication:  1) Decrease ditiazem SR 90 mg- take 1 capsule by mouth ONCE daily in the MORNING 2) Decrease lisinopril to 20 mg- take 1 tablet by mouth ONCE daily at BEDTIME  Labwork: - none ordered  Procedures/Testing: - none ordered  Follow-Up: - You have been referred to : Dr. Thompson Grayer [facility] office) for consideration of atrial fibrillation ablation- our scheduler, Lenna Sciara will call you to arrange this for [place] physician wants you to follow-up in: 6 months with Dr. Caryl Comes [facility]). You will receive a reminder letter in the mail two months in advance. If you don't receive a letter, please call our office to schedule the follow-up appointment.    Any Additional Special Instructions Will Be Listed Below (If Applicable).     If you need a refill on your cardiac medications before your next appointment, please call your pharmacy.

## 2017-03-01 NOTE — Progress Notes (Signed)
Patient Care Team: Crecencio Mc, MD as PCP - General (Internal Medicine)   HPI  Katherine Hudson is a 71 y.o. female Seen in follow-up for atrial flutter. She presented with an atypical flutter but without an antecedent history of cardiac instrumentation. It was presumed to be reverse typical flutter and 6/18 she underwent catheter ablation. Unfortunately, during the procedure she reverted spontaneously to a left atrial flutter. Flecainide therapy was initiated. However, on 2 occasions she has presented for flecainide assessment on treadmill testing and been found to be in atrial fibrillation.   DATE TEST    6/18    Echo   EF 60-65 % LAE (43/2.2/44)              Date Cr Hgb  6/18 1.25 14.5        DATE PR interval QRSduration Dose-Flecainide  4/18  146 70 0  7/18 160 76 100    Continued intermittent tachycardia palpitations. Increasing problems with shortness of breath since all of this started concurrent with initiation and up titration of her diltiazem. She was also noted more bradycardia.  Recently her blood pressure medications were further up titrated. She's now complaining of dizziness.   Records and Results Reviewed  EP study notes     Past Medical History:  Diagnosis Date  . Depression   . Hyperlipidemia   . Hypertension     Past Surgical History:  Procedure Laterality Date  . A-FLUTTER ABLATION N/A 02/02/2017   Procedure: A-Flutter Ablation;  Surgeon: Evans Lance, MD;  Location: [facility];  Service: Cardiovascular;  Laterality: N/A;  . APPENDECTOMY    . KNEE SURGERY  2000   right  . ROTATOR CUFF REPAIR  1998   right    Current Outpatient Prescriptions  Medication Sig Dispense Refill  . cetirizine (ZYRTEC) 10 MG tablet Take 10 mg by mouth daily as needed for allergies.    . Cholecalciferol (VITAMIN D3) 1000 units CAPS Take 1,000 Units by mouth daily.    Marland Kitchen CINNAMON PO Take 2,000 mg by mouth daily.    . Coenzyme Q10 (CO Q-10) 100  MG CAPS Take 100 mg by mouth daily.    . Cranberry 200 MG CAPS Take 200 mg by mouth 2 (two) times daily.     . dibucaine (NUPERCAINAL) 1 % OINT Place 1 application rectally as needed for pain. 30 g 0  . diltiazem (CARDIZEM SR) 90 MG 12 hr capsule Take 1 capsule (90 mg total) by mouth every 12 (twelve) hours. 60 capsule 3  . diphenhydrAMINE (BENADRYL) 2 % cream Apply 1 application topically as needed (bug bites).    . flecainide (TAMBOCOR) 100 MG tablet Take 1 tablet (100 mg total) by mouth 2 (two) times daily. 60 tablet 3  . fluticasone (FLONASE) 50 MCG/ACT nasal spray Place 2 sprays into the nose daily as needed. (Patient taking differently: Place 1 spray into the nose at bedtime. ) 16 g 5  . Krill Oil 500 MG CAPS Take 500 mg by mouth daily.    Marland Kitchen lisinopril (PRINIVIL,ZESTRIL) 40 MG tablet Take 40 mg by mouth daily.    . Melatonin 10 MG TABS Take 10 mg by mouth at bedtime as needed (sleep).    . Multiple Vitamin (MULTIVITAMIN) tablet Take 1 tablet by mouth daily.      . Naphazoline HCl (CLEAR EYES OP) Apply 1 drop to eye daily as needed (irritation).    Marland Kitchen oxybutynin (OXYTROL) 3.9  MG/24HR Place 1 patch onto the skin See admin instructions. Every 4 days    . pramoxine (PROCTOFOAM) 1 % foam Place 1 application rectally 3 (three) times daily as needed for itching. 15 g 0  . Probiotic Product (PROBIOTIC PO) Take 1 tablet by mouth daily.    . rivaroxaban (XARELTO) 20 MG TABS tablet Take 1 tablet (20 mg total) by mouth daily with supper. 30 tablet 6  . sertraline (ZOLOFT) 50 MG tablet TAKE THREE TABLETS BY MOUTH ONCE DAILY 270 tablet 1  . simvastatin (ZOCOR) 40 MG tablet Take 1 tablet (40 mg total) by mouth at bedtime. 90 tablet 0  . sodium chloride (OCEAN) 0.65 % SOLN nasal spray Place 1 spray into both nostrils as needed for congestion.    . TURMERIC PO Take 1 capsule by mouth daily.     No current facility-administered medications for this visit.     Allergies  Allergen Reactions  . Penicillins  Other (See Comments)    As a child-unknown Has patient had a PCN reaction causing immediate rash, facial/tongue/throat swelling, SOB or lightheadedness with hypotension: Unknown Has patient had a PCN reaction causing severe rash involving mucus membranes or skin necrosis: Unknown Has patient had a PCN reaction that required hospitalization: No Has patient had a PCN reaction occurring within the last 10 years: No If all of the above answers are "NO", then may proceed with Cephalosporin use.  Marland Kitchen Percocet [Oxycodone-Acetaminophen] Other (See Comments)    Hallucinations   . Latex Rash and Other (See Comments)    Knee scabbed over after using bandage      Review of Systems negative except from HPI and PMH  Physical Exam BP 94/62 (BP Location: Left Arm, Patient Position: Sitting, Cuff Size: Normal)   Pulse (!) 56   Ht 5\' 6"  (1.676 m)   Wt 178 lb 8 oz (81 kg)   BMI 28.81 kg/m  Well developed and well nourished in no acute distress HENT normal E scleral and icterus clear Neck Supple JVP flat; carotids brisk and full Clear to ausculation Regular rate and rhythm, no murmurs gallops or rub Soft with active bowel sounds No clubbing cyanosis  Edema Alert and oriented, grossly normal motor and sensory function Skin Warm and Dry  ECG demonstrates sinus rhythm at 56 Intervals 16/08/43  Assessment and  Plan  Atrial flutter-atypical and reverse typical  Atrial fibrillation/left atrial flutters  Dizziness  DOE  Hypotension/hypertension  Bradycardia question iatrogenic chronotropic incompetence  The patient continues to have recurrent atrial arrhythmias despite moderate dose flecainide. Her sinus bradycardia makes alternative antiarrhythmic drug options limited; we discussed the role of dofetilide as it is the least likely to trigger the need for backup bradycardia pacing. She is not inclined to pursue that course at present.  Interestingly, there is no significant impact on the QRS  duration or the PR interval reflecting a cardiac effects from the flecainide dose albeit at 100 mg twice daily; up titration would be a reasonable undertaking if the dizziness a base with blood pressure changes. See Below and the tachycardia remains problematic   We discussed catheter ablation as another alternative. She is interested in pursuing that although it would be in November before she is free. We will arrange her to see Dr. Rayann Heman for consideration of atrial fibrillation/left atrial flutter ablation  In the interim we'll continue her on her anticoagulation. No significant bleeding issues. We reviewed the relative risks and benefits compared to warfarin  Her dizziness is likely  related to her hypotension. We will decrease her lisinopril from 40--20; we will also be decreasing her diltiazem S R 90 from twice a day--daily in the hopes that her bradycardia is the cause of her dyspnea on exertion  And will be attenuated by improvements in sinus rates. The dizziness may also be a consequence of the flecainide. In the event that it does not improve with blood pressures systolics greater than 234 we will have to down titrate or discontinue the flecainide.  Euvolemic continue current meds    Current medicines are reviewed at length with the patient today .  The patient does not  have concerns regarding medicines.

## 2017-03-07 ENCOUNTER — Ambulatory Visit: Payer: Medicare Other | Admitting: Internal Medicine

## 2017-03-08 ENCOUNTER — Ambulatory Visit: Payer: Medicare Other | Admitting: Internal Medicine

## 2017-03-20 ENCOUNTER — Telehealth: Payer: Self-pay | Admitting: *Deleted

## 2017-03-20 NOTE — Telephone Encounter (Signed)
Patient has requested to have a note to be excused from jury duty for  Sept 9,2018.  Pt contact [phone]

## 2017-03-20 NOTE — Telephone Encounter (Signed)
Spoke with pt and she stated that she has IBS and urinary frequency and she has to get up and go to the bathroom a lot throughout the day.

## 2017-03-20 NOTE — Telephone Encounter (Signed)
I do not excuse people from jury duty just because they ask .  They have to have a valid medical reason  And I know of none that she has , unless she wants to tell me what she thinks it is and I will consider it

## 2017-03-20 NOTE — Telephone Encounter (Signed)
Please advise 

## 2017-03-21 NOTE — Telephone Encounter (Signed)
LMTCB. Need to let pt know that Dr. Derrel Nip has agreed to write her a letter to get her out of jury duty and that it is up front ready to be picked up. Letter is in the folder at front desk.

## 2017-03-21 NOTE — Telephone Encounter (Signed)
Pt called back. Informed pt of the message below. Pt will come and pick the letter up tomorrow.

## 2017-03-21 NOTE — Telephone Encounter (Signed)
Letter written

## 2017-03-29 ENCOUNTER — Other Ambulatory Visit: Payer: Self-pay

## 2017-03-29 ENCOUNTER — Telehealth: Payer: Self-pay | Admitting: Cardiovascular Disease

## 2017-03-29 MED ORDER — DILTIAZEM HCL ER 90 MG PO CP12: ORAL_CAPSULE | ORAL | 3 refills | Status: DC

## 2017-03-29 NOTE — Telephone Encounter (Signed)
Pt has a question regarding her Lisinopril. States her BP is 175. Please call.

## 2017-03-29 NOTE — Telephone Encounter (Signed)
Patient states that her lisinopril was decreased and since then her systolic blood pressures have been 140 to 170's and she states that it has not went down. Instructed her to increase it back to 40 mg once daily and I would have Dr. Rockey Situ review and see if he has any further recommendations. She also needs me to send in 90 day prescriptions because she is leaving for 3 months. Let her know that once I speak with him then I will be happy to assist her with getting those taken care of before her trip. She was appreciative for the call and had no further questions at this time.

## 2017-03-29 NOTE — Telephone Encounter (Signed)
90 day supply

## 2017-03-30 ENCOUNTER — Telehealth: Payer: Self-pay | Admitting: Internal Medicine

## 2017-03-30 ENCOUNTER — Other Ambulatory Visit: Payer: Self-pay | Admitting: Cardiovascular Disease

## 2017-03-30 MED ORDER — LISINOPRIL 40 MG PO TABS: 40.0000 mg | ORAL_TABLET | Freq: Every day | ORAL | 3 refills | Status: DC

## 2017-03-30 NOTE — Telephone Encounter (Signed)
Pt will call back to schedule AWV  Last AWV 09/18/14

## 2017-03-30 NOTE — Telephone Encounter (Signed)
Medication sent in for patient to pick up.

## 2017-03-30 NOTE — Telephone Encounter (Signed)
Would you like me to send in the Lisinopril back to 40 mg once daily due to her elevated pressures? Just want to make sure. Thanks

## 2017-03-30 NOTE — Telephone Encounter (Signed)
Pt leaving Monday 8/27 for 3 months Needs to confirm Pt waiting in lobby

## 2017-03-30 NOTE — Telephone Encounter (Signed)
Patient aware medication sent to [facility] .  She says she monitors her bp and hr regularly .   She is aware the nurse may call her and leave a detailed vm later to discuss changes / instructions.

## 2017-04-04 ENCOUNTER — Ambulatory Visit: Payer: Medicare Other | Admitting: Internal Medicine

## 2017-04-05 ENCOUNTER — Telehealth: Payer: Self-pay | Admitting: Cardiovascular Disease

## 2017-04-05 NOTE — Telephone Encounter (Signed)
Spoke w/ pt.  Pt is in [place], on her way to board a plane for [place] on 9/22. She has lost her Xarelto and pharmacy will only replace at full price, as ins will not cover another 90 day supply now (full price is >$400). Pt cannot p/u samples, as [place] is [place]. She is interested in possibly switching to coumadin, but advised her that this is not an option if she cannot come in for INR checks.  She would like to know if Eliquis would be a possibility, in hopes that possibly ins will cover a different prescription.  Advised her that I will make Dr. Rockey Situ aware of her predicament and call her back w/ his recommendation.

## 2017-04-05 NOTE — Telephone Encounter (Signed)
Spoke w/ Olen Cordial in [place]. Gave verbal order for Xarelto 20 mg once daily, #90 w/ 1 refill.  Asked him to call back w/ any further questions or concerns.

## 2017-04-05 NOTE — Telephone Encounter (Signed)
Pt calling stating she is out of town  She is living in a camper out of town but lost her Xarelto  She states we can send it to [facility] and she can get it transferred to a local one near her but the issue is that insurance won't cover it  She is not sure what to do  Would like advise on this Please call back

## 2017-04-05 NOTE — Telephone Encounter (Signed)
Patient is out of town for 3 months and would like to speak with the nurse regarding a change from xarelto to a cheaper medication. The patient is very frustrated and not making since with what she is needing. The patient was to follow up with Dr. Rockey Situ who prescribed the Xarelto to her on Dec 27, 2016 and was to follow up in 1 month from that visit with Dr. Rockey Situ but has not. Please contact the patient for advise.

## 2017-04-05 NOTE — Telephone Encounter (Signed)
Nixon calling from [place] stating patient lost Xarelto 20 mg po 90 day supply and a verbal is needed to refill this   Pharmacist wants a verbal order as patient is there now waiting.

## 2017-04-10 ENCOUNTER — Ambulatory Visit: Payer: Medicare Other | Admitting: Internal Medicine

## 2017-05-02 NOTE — Progress Notes (Signed)
Error

## 2017-05-03 NOTE — Telephone Encounter (Signed)
Scheduled 06/08/17

## 2017-06-04 ENCOUNTER — Telehealth: Payer: Self-pay | Admitting: Internal Medicine

## 2017-06-04 MED ORDER — SERTRALINE HCL 50 MG PO TABS: ORAL_TABLET | ORAL | 1 refills | Status: DC

## 2017-06-04 NOTE — Telephone Encounter (Signed)
Pt is requesting to have her sertraline (ZOLOFT) 50 MG tablet refilled. Pt has an appt with Dr. Derrel Nip on 06/08/17.

## 2017-06-04 NOTE — Telephone Encounter (Signed)
Medication has been refilled and pt is aware.  

## 2017-06-08 ENCOUNTER — Ambulatory Visit (INDEPENDENT_AMBULATORY_CARE_PROVIDER_SITE_OTHER): Payer: Medicare Other | Admitting: Internal Medicine

## 2017-06-08 ENCOUNTER — Encounter: Payer: Self-pay | Admitting: Internal Medicine

## 2017-06-08 ENCOUNTER — Ambulatory Visit (INDEPENDENT_AMBULATORY_CARE_PROVIDER_SITE_OTHER): Payer: Medicare Other

## 2017-06-08 VITALS — BP 152/70 | HR 51 | Temp 98.3°F | Resp 14 | Ht 65.0 in | Wt 178.8 lb

## 2017-06-08 DIAGNOSIS — E034 Atrophy of thyroid (acquired): Secondary | ICD-10-CM

## 2017-06-08 DIAGNOSIS — I10 Essential (primary) hypertension: Secondary | ICD-10-CM

## 2017-06-08 DIAGNOSIS — R7303 Prediabetes: Secondary | ICD-10-CM | POA: Diagnosis not present

## 2017-06-08 DIAGNOSIS — Z0000 Encounter for general adult medical examination without abnormal findings: Secondary | ICD-10-CM

## 2017-06-08 DIAGNOSIS — E782 Mixed hyperlipidemia: Secondary | ICD-10-CM

## 2017-06-08 DIAGNOSIS — Z23 Encounter for immunization: Secondary | ICD-10-CM

## 2017-06-08 DIAGNOSIS — E559 Vitamin D deficiency, unspecified: Secondary | ICD-10-CM | POA: Diagnosis not present

## 2017-06-08 DIAGNOSIS — D2362 Other benign neoplasm of skin of left upper limb, including shoulder: Secondary | ICD-10-CM

## 2017-06-08 DIAGNOSIS — Z7901 Long term (current) use of anticoagulants: Secondary | ICD-10-CM | POA: Diagnosis not present

## 2017-06-08 LAB — COMPREHENSIVE METABOLIC PANEL
ALBUMIN: 4.5 g/dL (ref 3.5–5.2)
ALK PHOS: 52 U/L (ref 39–117)
ALT: 13 U/L (ref 0–35)
AST: 16 U/L (ref 0–37)
BILIRUBIN TOTAL: 0.7 mg/dL (ref 0.2–1.2)
BUN: 29 mg/dL — ABNORMAL HIGH (ref 6–23)
CO2: 26 mEq/L (ref 19–32)
Calcium: 9.7 mg/dL (ref 8.4–10.5)
Chloride: 105 mEq/L (ref 96–112)
Creatinine, Ser: 0.93 mg/dL (ref 0.40–1.20)
GFR: 63.09 mL/min (ref >60.00)
Glucose, Bld: 105 mg/dL — ABNORMAL HIGH (ref 70–99)
POTASSIUM: 4.2 meq/L (ref 3.5–5.1)
Sodium: 138 mEq/L (ref 135–145)
TOTAL PROTEIN: 7.2 g/dL (ref 6.0–8.3)

## 2017-06-08 LAB — CBC WITH DIFFERENTIAL/PLATELET
Basophils Absolute: 0.2 10*3/uL — ABNORMAL HIGH (ref 0.0–0.1)
Basophils Relative: 3.3 % — ABNORMAL HIGH (ref 0.0–3.0)
EOS PCT: 3.1 % (ref 0.0–5.0)
Eosinophils Absolute: 0.2 10*3/uL (ref 0.0–0.7)
HEMATOCRIT: 46.2 % — AB (ref 36.0–46.0)
HEMOGLOBIN: 15.3 g/dL — AB (ref 12.0–15.0)
LYMPHS PCT: 24.7 % (ref 12.0–46.0)
Lymphs Abs: 1.6 10*3/uL (ref 0.7–4.0)
MCHC: 33.1 g/dL (ref 30.0–36.0)
MCV: 84.2 fl (ref 78.0–100.0)
MONOS PCT: 7.1 % (ref 3.0–12.0)
Monocytes Absolute: 0.5 10*3/uL (ref 0.1–1.0)
Neutro Abs: 4 10*3/uL (ref 1.4–7.7)
Neutrophils Relative %: 61.8 % (ref 43.0–77.0)
Platelets: 183 10*3/uL (ref 150.0–400.0)
RBC: 5.48 Mil/uL — AB (ref 3.87–5.11)
RDW: 14.9 % (ref 11.5–15.5)
WBC: 6.5 10*3/uL (ref 4.0–10.5)

## 2017-06-08 LAB — MICROALBUMIN / CREATININE URINE RATIO
CREATININE, U: 116.1 mg/dL
MICROALB/CREAT RATIO: 0.6 mg/g (ref 0.0–30.0)

## 2017-06-08 LAB — TSH: TSH: 3.09 u[IU]/mL (ref 0.35–4.50)

## 2017-06-08 LAB — LIPID PANEL
CHOLESTEROL: 256 mg/dL — AB (ref 0–200)
HDL: 40.1 mg/dL (ref >39.00)
LDL Cholesterol: 180 mg/dL — ABNORMAL HIGH (ref 0–99)
NonHDL: 215.64
TRIGLYCERIDES: 180 mg/dL — AB (ref 0.0–149.0)
Total CHOL/HDL Ratio: 6
VLDL: 36 mg/dL (ref 0.0–40.0)

## 2017-06-08 LAB — HEMOGLOBIN A1C: HEMOGLOBIN A1C: 5.8 % (ref 4.6–6.5)

## 2017-06-08 LAB — VITAMIN D 25 HYDROXY (VIT D DEFICIENCY, FRACTURES): VITD: 34.18 ng/mL (ref 30.00–100.00)

## 2017-06-08 MED ORDER — LOSARTAN POTASSIUM 100 MG PO TABS: 100.0000 mg | ORAL_TABLET | Freq: Every day | ORAL | 3 refills | Status: DC

## 2017-06-08 NOTE — Patient Instructions (Addendum)
For your insomnia:  Try Diphenhydramine; it  is generic for benadryl  Try 25 mg 30 minutes before bedtime.  Can increase to 50 mg  If needed   Call if no improvement after 2 weeks,  I will prescribe something else!    For your chapped lips:  Continue using bert's bees  Increase water intake to 3 16 ounce water daily   Use good RX to find deals on medications   For your high blood pressure  I am Changing lisinopril to  Losartan  To  control blood pressure better   Goal BP is  120/70 to  130/80   Dermatology referral in process    For your prediabetes management :   Try to limit your snacks to   5 g sugar or less and your meals 30 g sugar or less   There are plenty of high protein low carb cookies,  But they're not called "cookies."  Look for them in the diet section  where the protein shakes are  Sold.   All of these have 5 g sugar or less : Power crunch Atkins bars KIND :thE  "low glycemic index"  variety QUEST : (taste better after being microwaved OUT OF THE WRAPPER)   You might want to try a premixed protein drink called Premier Protein shake for breakfast or late night snack . It is great tasting,   very low sugar and available of < $2 serving at [facility] and  In bulk for $1.50/serving at [employer] and [employer]  .    Nutritional analysis :  160 cal  30 g protein  1 g sugar 50% calcium needs   Vladimir Faster and [employer]

## 2017-06-08 NOTE — Patient Instructions (Addendum)
  Katherine Hudson , Thank you for taking time to come for your Medicare Wellness Visit. I appreciate your ongoing commitment to your health goals. Please review the following plan we discussed and let me know if I can assist you in the future.   These are the goals we discussed: Goals    . Increase physical activity          Walk for exercise 3 days a week, 30 minutes    . Increase water intake          Stay hydrated        This is a list of the screening recommended for you and due dates:  Health Maintenance  Topic Date Due  . Flu Shot  03/07/2017  . Mammogram  01/19/2019  . Tetanus Vaccine  12/16/2020  . Colon Cancer Screening  07/19/2023  . DEXA scan (bone density measurement)  Completed  .  Hepatitis C: One time screening is recommended by Center for Disease Control  (CDC) for  adults born from [birth year] through 1965.   Completed  . Pneumonia vaccines  Completed

## 2017-06-08 NOTE — Progress Notes (Signed)
Subjective:  Patient ID: Katherine Hudson, female    DOB: Feb 21, 1946  Age: 71 y.o. MRN: [id number]  CC: The primary encounter diagnosis was Hypothyroidism due to acquired atrophy of thyroid. 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  HPI [name] presents for follow up on prediabetes, hypothyroidism,  Atrial flutter  hypertension,  And hyperlipidemia .  Received flu vacine today  Cc; chapped liids.  Decreased water intake . Denies ulceration  Insomnia .  Trouble with initiation and maintenance. Denies restless legs symptoms and prior trialss have not helped  Has been off of simvastatin for 2 months . Muscle aches have improved since stopping (legs )   Taking Vit D not sure what dose  she is taking  elevated blood pressure readings at home as well since starting diltiazem and   Walking every day but not 30 mintues.      Lab Results  Component Value Date   HGBA1C 5.8 06/08/2017     Outpatient Medications Prior to Visit  Medication Sig Dispense Refill  . cetirizine (ZYRTEC) 10 MG tablet Take 10 mg by mouth daily as needed for allergies.    . Cholecalciferol (VITAMIN D3) 1000 units CAPS Take 1,000 Units by mouth daily.    Marland Kitchen CINNAMON PO Take 2,000 mg by mouth daily.    . Coenzyme Q10 (CO Q-10) 100 MG CAPS Take 100 mg by mouth daily.    . Cranberry 200 MG CAPS Take 200 mg by mouth 2 (two) times daily.     . dibucaine (NUPERCAINAL) 1 % OINT Place 1 application rectally as needed for pain. 30 g 0  . diltiazem (CARDIZEM SR) 90 MG 12 hr capsule Take 1 capsule (90 mg) by mouth once daily in the morning 90 capsule 3  . diphenhydrAMINE (BENADRYL) 2 % cream Apply 1 application topically as needed (bug bites).    . flecainide (TAMBOCOR) 100 MG tablet Take 1 tablet (100 mg total) by mouth 2  (two) times daily. 180 tablet 3  . fluticasone (FLONASE) 50 MCG/ACT nasal spray Place 2 sprays into the nose daily as needed. (Patient taking differently: Place 1 spray into the nose at bedtime. ) 16 g 5  . Krill Oil 500 MG CAPS Take 500 mg by mouth daily.    . Melatonin 10 MG TABS Take 10 mg by mouth at bedtime as needed (sleep).    . Multiple Vitamin (MULTIVITAMIN) tablet Take 1 tablet by mouth daily.      . Naphazoline HCl (CLEAR EYES OP) Apply 1 drop to eye daily as needed (irritation).    Marland Kitchen oxybutynin (OXYTROL) 3.9 MG/24HR Place 1 patch onto the skin See admin instructions. Every 4 days    . pramoxine (PROCTOFOAM) 1 % foam Place 1 application rectally 3 (three) times daily as needed for itching. 15 g 0  . Probiotic Product (PROBIOTIC PO) Take 1 tablet by mouth daily.    . rivaroxaban (XARELTO) 20 MG TABS tablet Take 1 tablet (20 mg total) by mouth daily with supper. 30 tablet 6  . sertraline (ZOLOFT) 50 MG tablet TAKE THREE TABLETS BY MOUTH ONCE DAILY 270 tablet 1  . simvastatin (ZOCOR) 40 MG tablet Take 1 tablet (40 mg total) by mouth at bedtime. 90 tablet 0  . sodium chloride (OCEAN) 0.65 % SOLN nasal spray Place 1 spray into both nostrils as  needed for congestion.    . TURMERIC PO Take 1 capsule by mouth daily.    Marland Kitchen lisinopril (PRINIVIL,ZESTRIL) 40 MG tablet Take 1 tablet (40 mg total) by mouth daily. 90 tablet 3   No facility-administered medications prior to visit.     Review of Systems;  Patient denies headache, fevers, malaise, unintentional weight loss, skin rash, eye pain, sinus congestion and sinus pain, sore throat, dysphagia,  hemoptysis , cough, dyspnea, wheezing, chest pain, palpitations, orthopnea, edema, abdominal pain, nausea, melena, diarrhea, constipation, flank pain, dysuria, hematuria, urinary  Frequency, nocturia, numbness, tingling, seizures,  Focal weakness, Loss of consciousness,  Tremor, insomnia, depression, anxiety, and suicidal ideation.      Objective:  BP  (!) 152/70 (BP Location: Left Arm, Patient Position: Sitting, Cuff Size: Normal)   Pulse (!) 51   Temp 98.3 F (36.8 C) (Oral)   Resp 14   Ht 5\' 5"  (1.651 m)   Wt 178 lb 12.8 oz (81.1 kg)   SpO2 95%   BMI 29.75 kg/m   BP Readings from Last 3 Encounters:  06/08/17 (!) 152/70  06/08/17 (!) 152/70  03/01/17 94/62    Wt Readings from Last 3 Encounters:  06/08/17 178 lb 12.8 oz (81.1 kg)  06/08/17 178 lb 12.8 oz (81.1 kg)  03/01/17 178 lb 8 oz (81 kg)    General appearance: alert, cooperative and appears stated age Ears: normal TM's and external ear canals both ears Throat: lips, mucosa, and tongue normal; teeth and gums normal Neck: no adenopathy, no carotid bruit, supple, symmetrical, trachea midline and thyroid not enlarged, symmetric, no tenderness/mass/nodules Back: symmetric, no curvature. ROM normal. No CVA tenderness. Lungs: clear to auscultation bilaterally Heart: regular rate and rhythm, S1, S2 normal, no murmur, click, rub or gallop Abdomen: soft, non-tender; bowel sounds normal; no masses,  no organomegaly Pulses: 2+ and symmetric Skin: Skin color, texture, turgor normal. No rashes or lesions Lymph nodes: Cervical, supraclavicular, and axillary nodes normal.  Lab Results  Component Value Date   HGBA1C 5.8 06/08/2017   HGBA1C 6.2 01/03/2017   HGBA1C 5.9 03/06/2016    Lab Results  Component Value Date   CREATININE 0.93 06/08/2017   CREATININE 1.25 (H) 02/02/2017   CREATININE 0.87 01/12/2017    Lab Results  Component Value Date   WBC 6.5 06/08/2017   HGB 15.3 (H) 06/08/2017   HCT 46.2 (H) 06/08/2017   PLT 183.0 06/08/2017   GLUCOSE 105 (H) 06/08/2017   CHOL 256 (H) 06/08/2017   TRIG 180.0 (H) 06/08/2017   HDL 40.10 06/08/2017   LDLDIRECT 85.0 01/03/2017   LDLCALC 180 (H) 06/08/2017   ALT 13 06/08/2017   AST 16 06/08/2017   NA 138 06/08/2017   K 4.2 06/08/2017   CL 105 06/08/2017   CREATININE 0.93 06/08/2017   BUN 29 (H) 06/08/2017   CO2 26  06/08/2017   TSH 3.09 06/08/2017   HGBA1C 5.8 06/08/2017   MICROALBUR <0.7 06/08/2017    Mm Screening Breast Tomo Bilateral  Result Date: 01/19/2017 CLINICAL DATA:  Screening. EXAM: 2D DIGITAL SCREENING BILATERAL MAMMOGRAM WITH CAD AND ADJUNCT TOMO COMPARISON:  Previous exam(s). ACR Breast Density Category c: The breast tissue is heterogeneously dense, which may obscure small masses. FINDINGS: There are no findings suspicious for malignancy. Images were processed with CAD. IMPRESSION: No mammographic evidence of malignancy. A result letter of this screening mammogram will be mailed directly to the patient. RECOMMENDATION: Screening mammogram in one year. (Code:SM-B-01Y) BI-RADS CATEGORY  1: Negative. Electronically Signed  By: Lovey Newcomer M.D.   On: 01/19/2017 08:01    Assessment & Plan:   Problem List Items Addressed This Visit    Essential hypertension   Relevant Medications   losartan (COZAAR) 100 MG tablet   Other Relevant Orders   Comprehensive metabolic panel (Completed)   Microalbumin / creatinine urine ratio (Completed)   Hypothyroidism due to acquired atrophy of thyroid - Primary    Thyroid function is WNL on current dose.  No current changes needed.   Lab Results  Component Value Date   TSH 3.09 06/08/2017         Relevant Orders   TSH (Completed)   Hypovitaminosis D   Relevant Orders   VITAMIN D 25 Hydroxy (Vit-D Deficiency, Fractures) (Completed)   Mixed hyperlipidemia    Untreated due to persistent myalgias while taking simvastatin. Will recommend low GI diet and exercise , repeta in 6 months and consider Repatha   Lab Results  Component Value Date   CHOL 256 (H) 06/08/2017   HDL 40.10 06/08/2017   LDLCALC 180 (H) 06/08/2017   LDLDIRECT 85.0 01/03/2017   TRIG 180.0 (H) 06/08/2017   CHOLHDL 6 06/08/2017   Lab Results  Component Value Date   ALT 13 06/08/2017   AST 16 06/08/2017   ALKPHOS 52 06/08/2017   BILITOT 0.7 06/08/2017           Relevant  Medications   losartan (COZAAR) 100 MG tablet   Other Relevant Orders   Lipid panel (Completed)   Prediabetes    Her A1c is  Lower  but still suggestive  Pre prediabetes .  I recommend she follow a low glycemic index diet and particpate regularly in an aerobic  exercise activity.  We should check an A1c in 6 months.   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 Gervasi's lisinopril. I am also having her start on losartan. Additionally, I am having her maintain her multivitamin, fluticasone, TURMERIC PO, Probiotic Product (PROBIOTIC PO), simvastatin, CINNAMON PO, Cranberry, oxybutynin, pramoxine, dibucaine, Vitamin D3, Krill Oil, Co Q-10, Melatonin, cetirizine, sodium chloride, Naphazoline HCl (CLEAR EYES OP), diphenhydrAMINE, flecainide, rivaroxaban, diltiazem, and sertraline.  Meds ordered this encounter  Medications  . losartan (COZAAR) 100 MG tablet    Sig: Take 1 tablet (100 mg total) by mouth daily.    Dispense:  30 tablet    Refill:  3    Medications Discontinued During This Encounter  Medication Reason  . lisinopril (PRINIVIL,ZESTRIL) 40 MG tablet     Follow-up: Return in about 6 months (around 12/06/2017).   Crecencio Mc, MD

## 2017-06-08 NOTE — Progress Notes (Signed)
Subjective:   Katherine Hudson is a 71 y.o. female who presents for Medicare Annual (Subsequent) preventive examination.  Review of Systems:  No ROS.  Medicare Wellness Visit. Additional risk factors are reflected in the social history.  Cardiac Risk Factors include: advanced age (>74men, >61 women);hypertension     Objective:     Vitals: BP (!) 152/70 (BP Location: Left Arm, Patient Position: Sitting, Cuff Size: Normal)   Pulse (!) 51   Temp 98.3 F (36.8 C) (Oral)   Resp 14   Ht 5\' 5"  (1.651 m)   Wt 178 lb 12.8 oz (81.1 kg)   SpO2 95%   BMI 29.75 kg/m   Body mass index is 29.75 kg/m.   Tobacco History  Smoking Status  . Former Smoker  . Years: 10.00  Smokeless Tobacco  . Never Used    Comment: quit 1980     Counseling given: Not Answered   Past Medical History:  Diagnosis Date  . Depression   . Hyperlipidemia   . Hypertension    Past Surgical History:  Procedure Laterality Date  . A-FLUTTER ABLATION N/A 02/02/2017   Procedure: A-Flutter Ablation;  Surgeon: Evans Lance, MD;  Location: [facility];  Service: Cardiovascular;  Laterality: N/A;  . APPENDECTOMY    . KNEE SURGERY  2000   right  . ROTATOR CUFF REPAIR  1998   right   Family History  Problem Relation Age of Onset  . Cancer Father        lung  . Cancer Mother        cervial  . Stroke Sister    History  Sexual Activity  . Sexual activity: Not on file    Outpatient Encounter Prescriptions as of 06/08/2017  Medication Sig  . cetirizine (ZYRTEC) 10 MG tablet Take 10 mg by mouth daily as needed for allergies.  . Cholecalciferol (VITAMIN D3) 1000 units CAPS Take 1,000 Units by mouth daily.  Marland Kitchen CINNAMON PO Take 2,000 mg by mouth daily.  . Coenzyme Q10 (CO Q-10) 100 MG CAPS Take 100 mg by mouth daily.  . Cranberry 200 MG CAPS Take 200 mg by mouth 2 (two) times daily.   . dibucaine (NUPERCAINAL) 1 % OINT Place 1 application rectally as needed for pain.  Marland Kitchen diltiazem (CARDIZEM SR)  90 MG 12 hr capsule Take 1 capsule (90 mg) by mouth once daily in the morning  . diphenhydrAMINE (BENADRYL) 2 % cream Apply 1 application topically as needed (bug bites).  . flecainide (TAMBOCOR) 100 MG tablet Take 1 tablet (100 mg total) by mouth 2 (two) times daily.  . fluticasone (FLONASE) 50 MCG/ACT nasal spray Place 2 sprays into the nose daily as needed. (Patient taking differently: Place 1 spray into the nose at bedtime. )  . Krill Oil 500 MG CAPS Take 500 mg by mouth daily.  . Melatonin 10 MG TABS Take 10 mg by mouth at bedtime as needed (sleep).  . Multiple Vitamin (MULTIVITAMIN) tablet Take 1 tablet by mouth daily.    . Naphazoline HCl (CLEAR EYES OP) Apply 1 drop to eye daily as needed (irritation).  Marland Kitchen oxybutynin (OXYTROL) 3.9 MG/24HR Place 1 patch onto the skin See admin instructions. Every 4 days  . pramoxine (PROCTOFOAM) 1 % foam Place 1 application rectally 3 (three) times daily as needed for itching.  . Probiotic Product (PROBIOTIC PO) Take 1 tablet by mouth daily.  . rivaroxaban (XARELTO) 20 MG TABS tablet Take 1 tablet (20 mg total)  by mouth daily with supper.  . sertraline (ZOLOFT) 50 MG tablet TAKE THREE TABLETS BY MOUTH ONCE DAILY  . simvastatin (ZOCOR) 40 MG tablet Take 1 tablet (40 mg total) by mouth at bedtime.  . sodium chloride (OCEAN) 0.65 % SOLN nasal spray Place 1 spray into both nostrils as needed for congestion.  . TURMERIC PO Take 1 capsule by mouth daily.  . [DISCONTINUED] lisinopril (PRINIVIL,ZESTRIL) 40 MG tablet Take 1 tablet (40 mg total) by mouth daily.   No facility-administered encounter medications on file as of 06/08/2017.     Activities of Daily Living In your present state of health, do you have any difficulty performing the following activities: 06/08/2017  Hearing? N  Vision? N  Difficulty concentrating or making decisions? N  Walking or climbing stairs? Y  Comment Left knee pain, intermittent  Dressing or bathing? N  Doing errands, shopping? N   Comment Chooses not to drive most of the time  Preparing Food and eating ? N  Using the Toilet? N  In the past six months, have you accidently leaked urine? Y  Comment Managed with a daily pad  Do you have problems with loss of bowel control? Y  Comment Followed by PCP  Managing your Medications? N  Managing your Finances? N  Housekeeping or managing your Housekeeping? N  Some recent data might be hidden    Patient Care Team: Crecencio Mc, MD as PCP - General (Internal Medicine)    Assessment:    This is a routine wellness examination for Katherine Hudson. The goal of the wellness visit is to assist the patient how to close the gaps in care and create a preventative care plan for the patient.   The roster of all physicians providing medical care to patient is listed in the Snapshot section of the chart.  Taking calcium VIT D as appropriate/Osteoporosis risk reviewed.    Safety issues reviewed; Smoke and carbon monoxide detectors in the home. No firearms in the home.   Wears seatbelts when driving or riding with others. Patient does wear sunscreen or protective clothing when in direct sunlight. No violence in the home.  Patient is alert, normal appearance, oriented to person/place/and time.  Correctly identified the president of the [place], recall of 3/3 words, and performing simple calculations. Displays appropriate judgement and can read correct time from watch face.   No new identified risk were noted.  No failures at ADL's or IADL's.   BMI- discussed the importance of a healthy diet, water intake and the benefits of aerobic exercise. Educational material provided.   24 hour diet recall: Breakfast: grapes Lunch: skinless chicken, mashed potatoes, squash Dinner: Katherine Hudson cannot recall  Daily fluid intake: 0 cups of caffeine, 1-2 cups of water  Dental- every 12 months.  Eye- Visual acuity not assessed per patient preference since they have regular follow up with the ophthalmologist.   Wears corrective lenses.  Sleep patterns- Katherine Hudson sleeps 1 out of every 2 nights.  States Katherine Hudson has always had this pattern and takes Melatonin as needed.  High dose influenza administered R deltoid, tolerated well. Educational material provided.  Health maintenance gaps- closed.  Patient Concerns: None at this time. Follow up with PCP as needed.  Exercise Activities and Dietary recommendations Current Exercise Habits: The patient does not participate in regular exercise at present  Goals    . Increase physical activity          Walk for exercise 3 days a week, 30 minutes    .  Increase water intake          Stay hydrated       Fall Risk Fall Risk  06/08/2017 12/11/2016 09/20/2014 02/18/2014  Falls in the past year? Yes Yes Yes Yes  Number falls in past yr: 1 2 or more 1 1  Injury with Fall? Yes Yes Yes Yes  Comment Katherine Hudson sought medical attention for hurt ribs - - -  Risk Factor Category  High Fall Risk High Fall Risk - -  Risk for fall due to : History of fall(s) - History of fall(s) -  Follow up Education provided;Falls prevention discussed - Falls prevention discussed -   Depression Screen PHQ 2/9 Scores 06/08/2017 12/11/2016 09/20/2014  PHQ - 2 Score 0 0 0  Exception Documentation - Patient refusal -     Cognitive Function MMSE - Mini Mental State Exam 06/08/2017  Orientation to time 5  Orientation to Place 5  Registration 3  Attention/ Calculation 5  Recall 3  Language- name 2 objects 2  Language- repeat 1  Language- follow 3 step command 3  Language- read & follow direction 1  Write a sentence 1  Copy design 1  Total score 30        Immunization History  Administered Date(s) Administered  . Influenza Split 05/30/2014  . Influenza Whole 07/13/2011  . Influenza, High Dose Seasonal PF 05/26/2015, 06/08/2017  . Influenza,inj,Quad PF,6+ Mos 06/02/2013  . Influenza-Unspecified 04/07/2012  . Pneumococcal Conjugate-13 06/02/2013  . Pneumococcal Polysaccharide-23  05/26/2015  . Tdap 12/17/2010  . Zoster 05/30/2014   Screening Tests Health Maintenance  Topic Date Due  . INFLUENZA VACCINE  03/07/2017  . MAMMOGRAM  01/19/2019  . TETANUS/TDAP  12/16/2020  . COLONOSCOPY  07/19/2023  . DEXA SCAN  Completed  . Hepatitis C Screening  Completed  . PNA vac Low Risk Adult  Completed      Plan:    End of life planning; Advance aging; Advanced directives discussed. Copy of current HCPOA/Living Will requested.    I have personally reviewed and noted the following in the patient's chart:   . Medical and social history . Use of alcohol, tobacco or illicit drugs  . Current medications and supplements . Functional ability and status . Nutritional status . Physical activity . Advanced directives . List of other physicians . Hospitalizations, surgeries, and ER visits in previous 12 months . Vitals . Screenings to include cognitive, depression, and falls . Referrals and appointments  In addition, I have reviewed and discussed with patient certain preventive protocols, quality metrics, and best practice recommendations. A written personalized care plan for preventive services as well as general preventive health recommendations were provided to patient.     OBrien-Blaney, Tawnia Schirm L, LPN  70/0/1749   I have reviewed the above information and agree with above.   Deborra Medina, MD

## 2017-06-10 NOTE — Assessment & Plan Note (Addendum)
Untreated due to persistent myalgias while taking simvastatin. Will recommend low GI diet and exercise , repeta in 6 months and consider Repatha   Lab Results  Component Value Date   CHOL 256 (H) 06/08/2017   HDL 40.10 06/08/2017   LDLCALC 180 (H) 06/08/2017   LDLDIRECT 85.0 01/03/2017   TRIG 180.0 (H) 06/08/2017   CHOLHDL 6 06/08/2017   Lab Results  Component Value Date   ALT 13 06/08/2017   AST 16 06/08/2017   ALKPHOS 52 06/08/2017   BILITOT 0.7 06/08/2017

## 2017-06-10 NOTE — Assessment & Plan Note (Signed)
Thyroid function is WNL on current dose.  No current changes needed.   Lab Results  Component Value Date   TSH 3.09 06/08/2017

## 2017-06-10 NOTE — Assessment & Plan Note (Signed)
Her A1c is  Lower  but still suggestive  Pre prediabetes .  I recommend she follow a low glycemic index diet and particpate regularly in an aerobic  exercise activity.  We should check an A1c in 6 months.   Lab Results  Component Value Date   HGBA1C 5.8 06/08/2017

## 2017-06-11 ENCOUNTER — Encounter: Payer: Self-pay | Admitting: Internal Medicine

## 2017-06-11 ENCOUNTER — Ambulatory Visit (INDEPENDENT_AMBULATORY_CARE_PROVIDER_SITE_OTHER): Payer: Medicare Other | Admitting: Internal Medicine

## 2017-06-11 VITALS — BP 158/76 | HR 51 | Ht 66.0 in | Wt 177.8 lb

## 2017-06-11 DIAGNOSIS — I480 Paroxysmal atrial fibrillation: Secondary | ICD-10-CM

## 2017-06-11 DIAGNOSIS — I119 Hypertensive heart disease without heart failure: Secondary | ICD-10-CM | POA: Diagnosis not present

## 2017-06-11 DIAGNOSIS — I4892 Unspecified atrial flutter: Secondary | ICD-10-CM

## 2017-06-11 NOTE — Patient Instructions (Signed)
Medication Instructions:  Your physician recommends that you continue on your current medications as directed. Please refer to the Current Medication list given to you today.  -- If you need a refill on your cardiac medications before your next appointment, please call your pharmacy. --  Labwork: None ordered  Testing/Procedures: None ordered  Follow-Up: Your physician wants you to follow-up in: 6 months with Dr. Caryl Comes in [place].  You will receive a reminder letter in the mail two months in advance. If you don't receive a letter, please call our office to schedule the follow-up appointment.  Thank you for choosing [facility]!!   Frederik Schmidt, RN [phone]  Any Other Special Instructions Will Be Listed Below (If Applicable).

## 2017-06-11 NOTE — Progress Notes (Signed)
Electrophysiology Office Note   Date:  06/11/2017   ID:  Katherine Hudson, Katherine Hudson 03-07-1946, MRN [id number]  PCP:  Crecencio Mc, MD  Cardiologist:  Dr Rockey Situ Primary Electrophysiologist: Dr Caryl Comes   Chief Complaint  Patient presents with  . Atrial Fibrillation     History of Present Illness: Katherine Hudson is a 71 y.o. female who presents today for electrophysiology evaluation.   The patient presents for further evaluation and management of atrial fibrillation/ atypical atrial flutter.  The patient initially had atrial flutter.  She underwent CTI ablation by Dr Lovena Le 02/02/17.  She had spontaneousl conversion to a left atrial flutter during the procedure and was cardioverted.  She was placed on flecainide.  Unfortunately, she has had atrial fibrillation since that time.  She reports symptoms of palpitations initially.  More recently, she states that she is doing well.  She is unaware of any further symptomatic atrial arrhythmias.  She takes her flecainide once per day.  She wishes to continue this approach and is very clear that she is not interested in additional ablation procedures at this time.  Today, she denies symptoms of chest pain, shortness of breath, orthopnea, PND, lower extremity edema, claudication, dizziness, presyncope, syncope, bleeding, or neurologic sequela. The patient is tolerating medications without difficulties and is otherwise without complaint today.    Past Medical History:  Diagnosis Date  . Depression   . Hyperlipidemia   . Hypertension   . Left atrial flutter by electrocardiogram [facility])    demonstrated by Dr Lovena Le on EPS 6/18, not ablated  . Paroxysmal atrial fibrillation (HCC)   . Typical atrial flutter [facility])    s/p CTI ablation by Dr Lovena Le 6/18   Past Surgical History:  Procedure Laterality Date  . APPENDECTOMY    . KNEE SURGERY  2000   right  . ROTATOR CUFF REPAIR  1998   right     Current Outpatient Medications  Medication Sig  Dispense Refill  . cetirizine (ZYRTEC) 10 MG tablet Take 10 mg by mouth daily as needed for allergies.    . Cholecalciferol (VITAMIN D3) 1000 units CAPS Take 1,000 Units by mouth daily.    Marland Kitchen CINNAMON PO Take 2,000 mg by mouth daily.    . Coenzyme Q10 (CO Q-10) 100 MG CAPS Take 100 mg by mouth daily.    . Cranberry 200 MG CAPS Take 200 mg by mouth 2 (two) times daily.     Marland Kitchen diltiazem (CARDIZEM SR) 90 MG 12 hr capsule Take 1 capsule (90 mg) by mouth once daily in the morning 90 capsule 3  . diphenhydrAMINE (BENADRYL) 2 % cream Apply 1 application topically as needed (bug bites).    . flecainide (TAMBOCOR) 100 MG tablet Take 100 mg daily by mouth.    . fluticasone (FLONASE) 50 MCG/ACT nasal spray Place 2 sprays at bedtime into both nostrils.    Javier Docker Oil 500 MG CAPS Take 500 mg by mouth daily.    Marland Kitchen lisinopril (PRINIVIL,ZESTRIL) 40 MG tablet Take 40 mg daily by mouth.    . Melatonin 10 MG TABS Take 10 mg by mouth at bedtime as needed (sleep).    . Multiple Vitamin (MULTIVITAMIN) tablet Take 1 tablet by mouth daily.      . Naphazoline HCl (CLEAR EYES OP) Apply 1 drop to eye daily as needed (irritation).    . naproxen sodium (ALEVE) 220 MG tablet Take 220 mg daily as needed by mouth.    . omega-3 acid  ethyl esters (LOVAZA) 1 g capsule Take 2 g daily by mouth.    . oxybutynin (OXYTROL) 3.9 MG/24HR Place 1 patch onto the skin See admin instructions. Every 4 days    . pramoxine (PROCTOFOAM) 1 % foam Place 1 application rectally 3 (three) times daily as needed for itching. 15 g 0  . Probiotic Product (PROBIOTIC PO) Take 1 tablet by mouth daily.    . rivaroxaban (XARELTO) 20 MG TABS tablet Take 1 tablet (20 mg total) by mouth daily with supper. 30 tablet 6  . sertraline (ZOLOFT) 50 MG tablet TAKE THREE TABLETS BY MOUTH ONCE DAILY 270 tablet 1  . sodium chloride (OCEAN) 0.65 % SOLN nasal spray Place 1 spray into both nostrils as needed for congestion.    . TURMERIC PO Take 1 capsule by mouth daily.     Marland Kitchen losartan (COZAAR) 100 MG tablet Take 1 tablet (100 mg total) by mouth daily. (Patient not taking: Reported on 06/11/2017) 30 tablet 3  . simvastatin (ZOCOR) 40 MG tablet Take 1 tablet (40 mg total) by mouth at bedtime. (Patient not taking: Reported on 06/11/2017) 90 tablet 0   No current facility-administered medications for this visit.     Allergies:   Penicillins; Percocet [oxycodone-acetaminophen]; and Latex   Social History:  The patient  reports that she has quit smoking. She quit after 10.00 years of use. she has never used smokeless tobacco. She reports that she drinks alcohol. She reports that she does not use drugs.   Family History:  The patient's  family history includes Cancer in her father and mother; Stroke in her sister.    ROS:  Please see the history of present illness.   All other systems are personally reviewed and negative.    PHYSICAL EXAM: VS:  BP (!) 158/76   Pulse (!) 51   Ht 5\' 6"  (1.676 m)   Wt 177 lb 12.8 oz (80.6 kg)   SpO2 97%   BMI 28.70 kg/m  , BMI Body mass index is 28.7 kg/m. GEN: Well nourished, well developed, in no acute distress  HEENT: normal  Neck: no JVD, carotid bruits, or masses Cardiac: RRR; no murmurs, rubs, or gallops,no edema  Respiratory:  clear to auscultation bilaterally, normal work of breathing GI: soft, nontender, nondistended, + BS MS: no deformity or atrophy  Skin: warm and dry  Neuro:  Strength and sensation are intact Psych: euthymic mood, full affect  EKG:  EKG is ordered today. The ekg ordered today is personally reviewed and shows sinus bradycardia 51 bpm, otherwise normal ekg   Recent Labs: 12/11/2016: Magnesium 2.1 06/08/2017: ALT 13; BUN 29; Creatinine, Ser 0.93; Hemoglobin 15.3; Platelets 183.0; Potassium 4.2; Sodium 138; TSH 3.09  personally reviewed   Lipid Panel     Component Value Date/Time   CHOL 256 (H) 06/08/2017 0945   TRIG 180.0 (H) 06/08/2017 0945   HDL 40.10 06/08/2017 0945   CHOLHDL 6  06/08/2017 0945   VLDL 36.0 06/08/2017 0945   LDLCALC 180 (H) 06/08/2017 0945   LDLDIRECT 85.0 01/03/2017 1457   personally reviewed   Wt Readings from Last 3 Encounters:  06/11/17 177 lb 12.8 oz (80.6 kg)  06/08/17 178 lb 12.8 oz (81.1 kg)  06/08/17 178 lb 12.8 oz (81.1 kg)     Other studies personally reviewed: Additional studies/ records that were reviewed today include: Dr Aquilla Hacker, notes, Dr Forde Dandy procedure note  Review of the above records today demonstrates: as above   ASSESSMENT AND PLAN:  1.  Paroxysmal atrial fibrillation/ left atrial flutter The patient has had recurrent atrial arrhythmias.  She is unaware of any recent symptomatic atrial arrhythmias and currently says that she is doing well..  She takes her flecainide once per day.  She wishes to continue this approach and is very clear that she is not interested in additional ablation procedures at this time.  2. Hypertensive cardiovascular disease Elevated BP today She has been advised to switch lisinopril to losartan but keeps "forgetting" to pick up her prescription.  I have stressed the importance of BP control in order to maintain sinus rhythm long term Regular exercise, weight reduction, and salt restriction would also be beneficial.  Follow-up with Dr Caryl Comes in [place].  I am happy to see as needed going forward  Current medicines are reviewed at length with the patient today.   The patient does not have concerns regarding her medicines.  The following changes were made today:  none   Signed, Thompson Grayer, MD  06/11/2017 11:48 AM     [facility] [address] [place] [address] [phone] (office) [phone] (fax)

## 2017-06-12 ENCOUNTER — Telehealth: Payer: Self-pay | Admitting: Internal Medicine

## 2017-06-12 NOTE — Telephone Encounter (Signed)
Pt stated office has been calling and she is not sure why. I see lab results but do not see that [place] may give results.

## 2017-06-13 NOTE — Telephone Encounter (Signed)
Do not see where these have been resulted yet. Looked for a [employer] and did not see one of them either.

## 2017-07-06 DIAGNOSIS — D485 Neoplasm of uncertain behavior of skin: Secondary | ICD-10-CM | POA: Diagnosis not present

## 2017-07-06 DIAGNOSIS — L570 Actinic keratosis: Secondary | ICD-10-CM | POA: Diagnosis not present

## 2017-07-06 DIAGNOSIS — L720 Epidermal cyst: Secondary | ICD-10-CM | POA: Diagnosis not present

## 2017-07-06 DIAGNOSIS — D1801 Hemangioma of skin and subcutaneous tissue: Secondary | ICD-10-CM | POA: Diagnosis not present

## 2017-07-06 DIAGNOSIS — D229 Melanocytic nevi, unspecified: Secondary | ICD-10-CM | POA: Diagnosis not present

## 2017-07-06 DIAGNOSIS — D2362 Other benign neoplasm of skin of left upper limb, including shoulder: Secondary | ICD-10-CM | POA: Diagnosis not present

## 2017-07-06 DIAGNOSIS — K130 Diseases of lips: Secondary | ICD-10-CM | POA: Diagnosis not present

## 2017-07-09 ENCOUNTER — Telehealth: Payer: Self-pay | Admitting: Internal Medicine

## 2017-07-09 NOTE — Telephone Encounter (Signed)
Pt dropped off  Readings  Placed in folder to be delivered with mail

## 2017-07-10 NOTE — Telephone Encounter (Signed)
BP readings are all elevated.  Does she have losartan or lisinpril at home?  If so, resume lisinopril at 20 mg daily or losartan at 50 mg daily.

## 2017-07-10 NOTE — Telephone Encounter (Signed)
Placed in yellow results folder.  °

## 2017-07-11 MED ORDER — AMLODIPINE BESYLATE 2.5 MG PO TABS: 2.5000 mg | ORAL_TABLET | Freq: Every day | ORAL | 3 refills | Status: DC

## 2017-07-11 NOTE — Telephone Encounter (Signed)
Adding amlodipine 2.5 mg daily , continue  losartan 100 mg daily .  Recheck bp in one week and call if > 130/80

## 2017-07-11 NOTE — Telephone Encounter (Signed)
Spoke with pt and she stated that she is currently taking the losartan 100mg  daily.

## 2017-07-13 DIAGNOSIS — B078 Other viral warts: Secondary | ICD-10-CM | POA: Diagnosis not present

## 2017-07-13 NOTE — Telephone Encounter (Signed)
LMTCB

## 2017-07-23 NOTE — Telephone Encounter (Signed)
Was able to get in touch with pt and she stated that she has been taking the amlodipine for 3 days. She stated that she has been having a lot of dizzy spells since starting it. She also stated that it hasn't brought her bp down yet. Lowest reading being 144/74.

## 2017-07-24 NOTE — Telephone Encounter (Signed)
Have her suspend the amlodipine and if the dizzy spells continue ,  Resume it at 5 mg daily.  If the dizzy spells stop, let me know

## 2017-07-27 ENCOUNTER — Telehealth: Payer: Self-pay | Admitting: Internal Medicine

## 2017-07-27 NOTE — Telephone Encounter (Signed)
Pt came in the office this morning to get clarification on dr. Lupita Dawn message. Once I spoke with Dr. Derrel Nip I called the pt back to clarify the message and pt verbalized understanding.

## 2017-08-01 ENCOUNTER — Telehealth: Payer: Self-pay | Admitting: Internal Medicine

## 2017-08-01 NOTE — Telephone Encounter (Signed)
I spoke with the patient- she reports that she has been having more trouble with her BP running high since being on her current medications then when she started these.  SBP is running 144-185/ HR- 55-65 She is s/p atrial flutter ablation in June, but developed a-fib post procedure. She is currently on flecainide 100 mg once daily- she states she missed this for about a week in October when she went to [place] and did fine without it.  She did resume this since coming home. She would like to come off this. She is also on diltiazem 90 mg once daily, losartan 100 mg once daily, and her amlodipine is currently on hold for about 3 days due to dizziness- this was ok'ed per Dr. Derrel Nip. The patient is requesting to come off flecainide and diltiazem. She said her BP was fine on lisinopril, but this was stopped 06/08/17 per Dr. Derrel Nip due to elevated BP and losartan was initiated. I advised the patient I cannot tell her to discontinue her medication permanently, this will need to come from Dr. Caryl Comes. I advised her I will forward to him for review. I also adviser her that she may notice her heart going out of rhythm more often if she stops the flecainide and diltiazem.  She is currently taking xarelto. Will forward to Dr. Caryl Comes to review and call her back. She is agreeable.

## 2017-08-01 NOTE — Telephone Encounter (Signed)
Signed.

## 2017-08-01 NOTE — Telephone Encounter (Signed)
I left a message for the patient to call. 

## 2017-08-01 NOTE — Telephone Encounter (Signed)
Patient has questions regarding medication Please call to discuss

## 2017-08-03 NOTE — Telephone Encounter (Signed)
I left a message for the patient to call. 

## 2017-08-03 NOTE — Telephone Encounter (Signed)
I couldn't agree with you more She can certainly stop the flec/dilt and would anticipate afib, but its symptoms would dictate what we would think to do next She and Dr Baruch Goldmann can negotiate her BP thnks

## 2017-08-03 NOTE — Telephone Encounter (Signed)
I spoke with the patient regarding Dr. Olin Pia recommendations that she could stop flecainide and diltiazem if she wished, but this may increase her chances of breakthrough of her a-fib.  She voices understanding.

## 2017-08-13 ENCOUNTER — Telehealth: Payer: Self-pay | Admitting: Internal Medicine

## 2017-08-13 NOTE — Telephone Encounter (Signed)
Copied from [place] [id number]. Topic: Quick Communication - See Telephone Encounter >> Aug 13, 2017  4:00 PM Cleaster Corin, NT wrote: CRM for notification. See Telephone encounter for:   08/13/17. Pt. Calling to let Dr. Derrel Nip know that she has stopped taking meds. (losartan,flecainide,diltiazem)And is now  taking lisinopril 40mg . But her Bp is still not where it needs to be. Pt. Asking for nurse or Dr. To give her a call to go over the nest step pt. Can be reached at [phone]

## 2017-08-14 NOTE — Telephone Encounter (Signed)
Spoke with pt and informed her that she needs to stop taking the lisinopril and start taking the losartan because it is stronger. Also told pt to give [place] a call back on Friday with some blood pressure readings. Pt gave a verbal understanding.

## 2017-08-14 NOTE — Telephone Encounter (Signed)
SEH STOP RESUME THE LOSARTAN AND STOP THE LISINOPRIL.  LOSartan is much stronger  100 mg daily,

## 2017-08-14 NOTE — Telephone Encounter (Signed)
Please advise 

## 2017-08-17 ENCOUNTER — Telehealth: Payer: Self-pay | Admitting: Internal Medicine

## 2017-08-17 NOTE — Telephone Encounter (Signed)
Pt dropped off BP and pulse results. She does not think current medication is working as well as the old.  Pt is leaving for [place] on Tuesday, 08/21/17 for at least 2 months.  Paper is up front in Dr. Lupita Dawn color folder.

## 2017-08-17 NOTE — Telephone Encounter (Signed)
Placed in yellow results folder.  °

## 2017-09-20 ENCOUNTER — Encounter: Payer: Self-pay | Admitting: *Deleted

## 2017-09-20 ENCOUNTER — Telehealth: Payer: Self-pay | Admitting: Internal Medicine

## 2017-09-20 NOTE — Telephone Encounter (Signed)
Patient's BP is not regulating Patient believes it could be due to ablation last year Patient would like response through [facility]

## 2017-09-20 NOTE — Telephone Encounter (Signed)
Responded to the patient via MyChart per her request.

## 2017-10-02 ENCOUNTER — Other Ambulatory Visit: Payer: Self-pay | Admitting: Internal Medicine

## 2017-10-02 MED ORDER — DILTIAZEM HCL ER 90 MG PO CP12: 90.0000 mg | ORAL_CAPSULE | Freq: Two times a day (BID) | ORAL | 6 refills | Status: DC

## 2017-10-08 ENCOUNTER — Telehealth: Payer: Self-pay | Admitting: *Deleted

## 2017-10-08 MED ORDER — DILTIAZEM HCL ER 90 MG PO CP12: 90.0000 mg | ORAL_CAPSULE | Freq: Two times a day (BID) | ORAL | 6 refills | Status: DC

## 2017-10-08 NOTE — Telephone Encounter (Signed)
-----   Message from Crecencio Mc, MD sent at 10/03/2017  8:24 AM EST ----- Regarding: mychart messages Katherine Hudson,  I managed to get through all 20 mychart messages last night without blowing a gasket,  But I apologize if my messages to you and  Katherine Hudson were terse or angry.  The only patient that needs calling ,  I believe is Katherine Hudson,  Who is having blood pressure elevations and Dr Caryl Comes referred her back to me .  I don;t know if patient is in [place] now or [place] , but he suggested resuming twice daily diltiazem  So I refilled the 90 mg dose  .  Can you all send it wherever she wants it?  And if she is in town,  She needs to follow up with rn visit in one week.  If not in town,  She needs to see someone in [place] In a week.  Pray for a snow day on Monday!  TT

## 2017-11-05 ENCOUNTER — Other Ambulatory Visit: Payer: Self-pay | Admitting: Internal Medicine

## 2017-11-05 ENCOUNTER — Telehealth: Payer: Self-pay | Admitting: Internal Medicine

## 2017-11-05 MED ORDER — RIVAROXABAN 20 MG PO TABS: 20.0000 mg | ORAL_TABLET | Freq: Every day | ORAL | 6 refills | Status: DC

## 2017-11-05 MED ORDER — FLECAINIDE ACETATE 100 MG PO TABS: 50.0000 mg | ORAL_TABLET | Freq: Two times a day (BID) | ORAL | Status: DC

## 2017-11-05 NOTE — Telephone Encounter (Signed)
Copied from [place] [id number]. Topic: Quick Communication - See Telephone Encounter >> Nov 05, 2017  9:49 AM Percell Belt A wrote: CRM for notification. See Telephone encounter for: 11/05/17. Pt called in and stated that her Losartan is recalled and would like to know if what can be called in in its place?  Please advise   Pt also needs a refill on her rivaroxaban (XARELTO) 20 MG TABS tablet [[id number]]  [place] [facility] [address] number -[phone]

## 2017-11-05 NOTE — Telephone Encounter (Signed)
Chart reviewed. Would resume Flecanide 50mg  twice daily and repeat EKG next week (preferrably with AF clinic).  Continue Diltiazem.   Chanetta Marshall, NP 11/05/2017 2:25 PM

## 2017-11-05 NOTE — Telephone Encounter (Signed)
telmisartan sent as alternative to losartan  Xarelto refilled

## 2017-11-05 NOTE — Telephone Encounter (Signed)
Pt calling stating last week she was having issues with getting her HR down   She states it was so high it wouldn't read a number on her machine  10/31/17 143/75 HR 146 11/01/17 122/68 HR 83 11/05/17 121/70 61   She states during those times she would have pains in her chest/ she states she wasn't able to describe it Left arm would have a tingle. The tingle felt like arm was asleep.   Please advise

## 2017-11-05 NOTE — Telephone Encounter (Signed)
I spoke with the patient. She reports that she and her husband have been preparing to move back from [place] last week, then moving over the weekend.  On Wednesday 3/27- HR's were up to 147 bpm intermittently throughout the day. There were occasions that her BP monitor could not pick up on her HR's. This continued through Thursday with HR's around 113 bpm. She did feel more SOB with bending over and with minimal movement. This morning she woke up about 8:30 am and checked her BP/ HR- 121/70 (61). She has felt like her HR has been up and down some today, but not "racing."  I discussed with the patient that back in December, it was her request to come off of flecainide since she was feeling good and not had any issues at the time. She had gone to [place] and forgotten her flecainide during that time. I reminded her today that we advised her then that she most likely would have more issues with a-fib so it is not surprising that she is having this now. She went off diltiazem back in December as well at her request, but was restared on this due to her BP by PCP.  Dr. Derrel Nip is mangaging her BP's but, per the patient, 90% of the time SBP's are still high. Confirmed she is on diltiazem 90 mg BID and lostartan 100 mg once daily.  She thinks she still has flecainide at home. I discussed with the patient the need to potentially restart flecainide since, per her husband, she has bouts of a-fib about twice weekly. The other issue is that her HR's in sinus typically run in the 50's. I have advised the patient I will need to review with one of Dr. Olin Pia partners in his absence and call her back.  She is agreeable.  Will forward to Chanetta Marshall, EP NP to review.

## 2017-11-05 NOTE — Telephone Encounter (Signed)
Patient received letter in mail that her Losartan has been recalled?

## 2017-11-05 NOTE — Telephone Encounter (Signed)
I called and spoke with the patient. She is aware of Amber's recommendations to restart flecainide at 50 mg BID and repeat an EKG next week with the a-fib clinic.  I offered her a follow up appointment in the a-fib clinic next week, but the patient's husband refused as he is her transportation and is having issues with his legs. He did not want to drive to [place]. I advised the a-fib clinic was the preference, but if they did not want this, then I will arrange follow up in [place]. They are agreeable with seeing Ignacia Bayley, NP on Wednesday 4/10 at 3:30 pm.   The patient verbalized understanding of her appointment date/ time as well as starting flecainide back. She still has flecainide 100 mg tablets at home. I have advised her to take flecainide 100 mg- 1/2 tablet (50 mg) BID. She again verbalized understanding.

## 2017-11-14 ENCOUNTER — Ambulatory Visit: Payer: Medicare Other | Admitting: Nurse Practitioner

## 2017-11-14 ENCOUNTER — Encounter: Payer: Self-pay | Admitting: Nurse Practitioner

## 2017-11-14 ENCOUNTER — Other Ambulatory Visit: Payer: Self-pay | Admitting: Internal Medicine

## 2017-11-14 VITALS — BP 130/60 | HR 52 | Ht 66.0 in | Wt 185.5 lb

## 2017-11-14 DIAGNOSIS — I10 Essential (primary) hypertension: Secondary | ICD-10-CM

## 2017-11-14 DIAGNOSIS — I484 Atypical atrial flutter: Secondary | ICD-10-CM | POA: Diagnosis not present

## 2017-11-14 DIAGNOSIS — I480 Paroxysmal atrial fibrillation: Secondary | ICD-10-CM

## 2017-11-14 MED ORDER — FLECAINIDE ACETATE 50 MG PO TABS: 50.0000 mg | ORAL_TABLET | Freq: Two times a day (BID) | ORAL | 3 refills | Status: DC

## 2017-11-14 MED ORDER — TELMISARTAN 80 MG PO TABS: 80.0000 mg | ORAL_TABLET | Freq: Every day | ORAL | 6 refills | Status: DC

## 2017-11-14 MED ORDER — DILTIAZEM HCL ER COATED BEADS 180 MG PO CP24: 180.0000 mg | ORAL_CAPSULE | Freq: Every day | ORAL | 3 refills | Status: DC

## 2017-11-14 NOTE — Progress Notes (Signed)
Office Visit    Patient Name: Katherine Hudson Date of Encounter: 11/14/2017  Primary Care Provider:  Crecencio Mc, MD Primary Cardiologist:  Virl Axe, MD  Chief Complaint    72 year old female with a history of paroxysmal atrial fibrillation, typical and atypical atrial flutter status post ablation for typical flutter in June 2018, hypertension, and hyperlipidemia, who presents for follow-up after recent recurrence of tachyarrhythmia.  Past Medical History    Past Medical History:  Diagnosis Date  . Depression   . Hyperlipidemia   . Hypertension   . Left atrial flutter by electrocardiogram [facility])    demonstrated by Dr Lovena Le on EPS 6/18, not ablated  . Paroxysmal atrial fibrillation (HCC)   . Typical atrial flutter [facility])    s/p CTI ablation by Dr Lovena Le 6/18   Past Surgical History:  Procedure Laterality Date  . A-FLUTTER ABLATION N/A 02/02/2017   CTI ablation by Dr Lovena Le for typical atrial flutter.  Left atrial flutter also observed and cardioverted  . APPENDECTOMY    . KNEE SURGERY  2000   right  . ROTATOR CUFF REPAIR  1998   right    Allergies  Allergies  Allergen Reactions  . Penicillins Other (See Comments)    As a child-unknown Has patient had a PCN reaction causing immediate rash, facial/tongue/throat swelling, SOB or lightheadedness with hypotension: Unknown Has patient had a PCN reaction causing severe rash involving mucus membranes or skin necrosis: Unknown Has patient had a PCN reaction that required hospitalization: No Has patient had a PCN reaction occurring within the last 10 years: No If all of the above answers are "NO", then may proceed with Cephalosporin use.  Marland Kitchen Percocet [Oxycodone-Acetaminophen] Other (See Comments)    Hallucinations   . Latex Rash and Other (See Comments)    Knee scabbed over after using bandage    History of Present Illness    72 year old female with the above complex past medical history including typical and  atypical atrial flutter, paroxysmal atrial fibrillation, hypertension, hyperlipidemia, and diabetes.  She was evaluated in May 2018 with reports of chest discomfort and tachycardia.  She was found to be in atrial flutter.  She was subsequently referred to electrophysiology and underwent catheter ablation for typical atrial flutter.  During the procedure she had spontaneous left atrial flutter and was placed on flecainide.  When she presented in the summer 2018 for exercise treadmill testing, she was found to be in rapid atrial fibrillation.  She later converted to sinus rhythm.  She has been evaluated in A. fib clinic however she wished to avoid further ablation.  She was maintained on flecainide and diltiazem but traveled in October to [place] and forgot to take her flecainide and diltiazem with her.  She subsequently stopped taking them altogether.  She was advised that she was likely to have recurrence of A. fib.  About 4 weeks ago, she began to experience frequent paroxysms of tachypalpitations, stating that she would spend most of the day in A. fib.  When this occurs, she feels fatigued and mildly dyspneic.  As result, she recently called the office on April 1 and was advised to initiate flecainide 50 twice daily and resume diltiazem SR 90 mg twice daily.  Over the past 48 hours, she has had less frequent paroxysms of A. fib and has been in sinus rhythm all day today as far she can tell.  She did have her diltiazem refilled but the pharmacy did not only had 6  tablets and she would like to get the switch to something else if possible.  She denies chest pain, PND, orthopnea, syncope, edema, or early satiety.  Home Medications    Prior to Admission medications   Medication Sig Start Date End Date Taking? Authorizing Provider  cetirizine (ZYRTEC) 10 MG tablet Take 10 mg by mouth daily as needed for allergies.   Yes [provider]  Cholecalciferol (VITAMIN D3) 1000 units CAPS Take 1,000 Units by  mouth daily.   Yes [provider]  CINNAMON PO Take 2,000 mg by mouth daily.   Yes [provider]  Coenzyme Q10 (CO Q-10) 100 MG CAPS Take 100 mg by mouth daily.   Yes [provider]  Cranberry 200 MG CAPS Take 200 mg by mouth 2 (two) times daily.    Yes [provider]  diphenhydrAMINE (BENADRYL) 2 % cream Apply 1 application topically as needed (bug bites).   Yes [provider]  fluticasone (FLONASE) 50 MCG/ACT nasal spray Place 2 sprays at bedtime into both nostrils.   Yes [provider]  Javier Docker Oil 500 MG CAPS Take 500 mg by mouth daily.   Yes [provider]  losartan (COZAAR) 100 MG tablet Take 1 tablet (100 mg total) by mouth daily. 06/08/17  Yes Crecencio Mc, MD  Melatonin 10 MG TABS Take 10 mg by mouth at bedtime as needed (sleep).   Yes [provider]  Multiple Vitamin (MULTIVITAMIN) tablet Take 1 tablet by mouth daily.     Yes [provider]  Naphazoline HCl (CLEAR EYES OP) Apply 1 drop to eye daily as needed (irritation).   Yes [provider]  naproxen sodium (ALEVE) 220 MG tablet Take 220 mg daily as needed by mouth.   Yes [provider]  omega-3 acid ethyl esters (LOVAZA) 1 g capsule Take 2 g daily by mouth.   Yes [provider]  oxybutynin (OXYTROL) 3.9 MG/24HR Place 1 patch onto the skin See admin instructions. Every 4 days   Yes [provider]  pramoxine (PROCTOFOAM) 1 % foam Place 1 application rectally 3 (three) times daily as needed for itching. 01/03/17  Yes Crecencio Mc, MD  Probiotic Product (PROBIOTIC PO) Take 1 tablet by mouth daily.   Yes [provider]  rivaroxaban (XARELTO) 20 MG TABS tablet Take 1 tablet (20 mg total) by mouth daily with supper. 11/05/17  Yes Crecencio Mc, MD  sertraline (ZOLOFT) 50 MG tablet TAKE THREE TABLETS BY MOUTH ONCE DAILY 06/04/17  Yes Crecencio Mc, MD  sodium chloride (OCEAN) 0.65 % SOLN nasal spray  Place 1 spray into both nostrils as needed for congestion.   Yes [provider]  TURMERIC PO Take 1 capsule by mouth daily.   Yes [provider]  diltiazem (CARDIZEM CD) 180 MG 24 hr capsule Take 1 capsule (180 mg total) by mouth daily. 11/14/17 02/12/18  Theora Gianotti, NP  flecainide (TAMBOCOR) 50 MG tablet Take 1 tablet (50 mg total) by mouth 2 (two) times daily. 11/14/17   Theora Gianotti, NP    Review of Systems    Increased tachypalpitations as outlined above..  All other systems reviewed and are otherwise negative except as noted above.  Physical Exam    VS:  BP 130/60 (BP Location: Left Arm, Patient Position: Sitting, Cuff Size: Normal)   Pulse (!) 52   Ht 5\' 6"  (1.676 m)   Wt 185 lb 8 oz (84.1 kg)  BMI 29.94 kg/m  , BMI Body mass index is 29.94 kg/m. GEN: Well nourished, well developed, in no acute distress.  HEENT: normal.  Neck: Supple, no JVD, carotid bruits, or masses. Cardiac: RRR, no murmurs, rubs, or gallops. No clubbing, cyanosis, edema.  Radials/DP/PT 2+ and equal bilaterally.  Respiratory:  Respirations regular and unlabored, clear to auscultation bilaterally. GI: Soft, nontender, nondistended, BS + x 4. MS: no deformity or atrophy. Skin: warm and dry, no rash. Neuro:  Strength and sensation are intact. Psych: Normal affect.  Accessory Clinical Findings    ECG -sinus bradycardia, 52, no acute ST or T changes.  Assessment & Plan    1.  Paroxysmal atrial fibrillation with history of both typical and atypical atrial flutter: Status post prior atrial flutter ablation.  She had previously been well controlled from an A. fib standpoint on diltiazem and flecainide but came off this on her own some part time between October and November.  She had been doing well until about 4 weeks ago.  Since then, she developed more frequent paroxysms of A. fib and was placed back on flecainide 50 mg twice daily as well as diltiazem 90 mg twice  daily on April 1.  Over the past 24-48 hours, she has been feeling much better and is in sinus rhythm today.  We will continue flecainide 50 twice daily.  Her pharmacy did not have an adequate amount of sustained release diltiazem and as such, I will change this to 180 mg daily.  She does have a history of bradycardia and she does follow her heart rate and rhythm at home.  We had a discussion about contacting us if heart rates trend in the 40s.  As she has never formally undergone treadmill testing in the setting of flecainide therapy, we will arrange for this.  Continue Xarelto.  She has follow-up with Dr. Caryl Comes in May.  2.  Essential hypertension: This has been relatively stable.  She has been off of her home dose of losartan as it was recalled by [facility].  She is in the process of shopping around to have it refilled someplace else.  3.  Disposition: Follow-up treadmill testing.  Follow-up with Dr. Caryl Comes as planned in May.   Murray Hodgkins, NP 11/14/2017, 5:38 PM

## 2017-11-14 NOTE — Patient Instructions (Addendum)
Medication Instructions: - Your physician has recommended you make the following change in your medication:   1) STOP diltiazem 90 mg tablets 2) START cardizem CD (diltiazem) 180 mg- take 1 capsule by mouth ONCE daily  (flecainide 50 mg tablets have been refilled at your pharmacy)  Labwork: - none ordered  Procedures/Testing: - Your physician has requested that you have an exercise tolerance test (preferrably on a day Dr. Caryl Comes is in the office)- in 2 weeks  - you may eat a light breakfast/ lunch prior to your procedure - no caffeine for 24 hours prior to your test (coffee, tea, soft drinks, or chocolate)  - no smoking/ vaping for 4 hours prior to your test - you may take your regular medications the day of your test - bring any inhalers with you to your test - wear comfortable clothing & tennis/ non-skid shoes to walk on the treadmill  Follow-Up: - as scheduled with Dr. Caryl Comes on 12/20/17  Any Additional Special Instructions Will Be Listed Below (If Applicable).     If you need a refill on your cardiac medications before your next appointment, please call your pharmacy.     Exercise Stress Electrocardiogram An exercise stress electrocardiogram is a test that is done to evaluate the blood supply to your heart. This test may also be called exercise stress electrocardiography. The test is done while you are walking on a treadmill. The goal of this test is to raise your heart rate. This test is done to find areas of poor blood flow to the heart by determining the extent of coronary artery disease (CAD). CAD is defined as narrowing in one or more heart (coronary) arteries of more than 70%. If you have an abnormal test result, this may mean that you are not getting adequate blood flow to your heart during exercise. Additional testing may be needed to understand why your test was abnormal. Tell a health care provider about:  Any allergies you have.  All medicines you are taking,  including vitamins, herbs, eye drops, creams, and over-the-counter medicines.  Any problems you or family members have had with anesthetic medicines.  Any blood disorders you have.  Any surgeries you have had.  Any medical conditions you have.  Possibility of pregnancy, if this applies. What are the risks? Generally, this is a safe procedure. However, as with any procedure, complications can occur. Possible complications can include:  Pain or pressure in the following areas: ? Chest. ? Jaw or neck. ? Between your shoulder blades. ? Radiating down your left arm.  Dizziness or light-headedness.  Shortness of breath.  Increased or irregular heartbeats.  Nausea or vomiting.  Heart attack (rare).  What happens before the procedure?  Avoid all forms of caffeine 24 hours before your test or as directed by your health care provider. This includes coffee, tea (even decaffeinated tea), caffeinated sodas, chocolate, cocoa, and certain pain medicines.  Follow your health care provider's instructions regarding eating and drinking before the test.  Take your medicines as directed at regular times with water unless instructed otherwise. Exceptions may include: ? If you have diabetes, ask how you are to take your insulin or pills. It is common to adjust insulin dosing the morning of the test. ? If you are taking beta-blocker medicines, it is important to talk to your health care provider about these medicines well before the date of your test. Taking beta-blocker medicines may interfere with the test. In some cases, these medicines need to be  changed or stopped 24 hours or more before the test. ? If you wear a nitroglycerin patch, it may need to be removed prior to the test. Ask your health care provider if the patch should be removed before the test.  If you use an inhaler for any breathing condition, bring it with you to the test.  If you are an outpatient, bring a snack so you can eat  right after the stress phase of the test.  Do not smoke for 4 hours prior to the test or as directed by your health care provider.  Do not apply lotions, powders, creams, or oils on your chest prior to the test.  Wear loose-fitting clothes and comfortable shoes for the test. This test involves walking on a treadmill. What happens during the procedure?  Multiple patches (electrodes) will be put on your chest. If needed, small areas of your chest may have to be shaved to get better contact with the electrodes. Once the electrodes are attached to your body, multiple wires will be attached to the electrodes and your heart rate will be monitored.  Your heart will be monitored both at rest and while exercising.  You will walk on a treadmill. The treadmill will be started at a slow pace. The treadmill speed and incline will gradually be increased to raise your heart rate. What happens after the procedure?  Your heart rate and blood pressure will be monitored after the test.  You may return to your normal schedule including diet, activities, and medicines, unless your health care provider tells you otherwise. This information is not intended to replace advice given to you by your health care provider. Make sure you discuss any questions you have with your health care provider. Document Released: 07/21/2000 Document Revised: 12/30/2015 Document Reviewed: 03/31/2013 Elsevier Interactive Patient Education  2017 [employer].

## 2017-11-15 ENCOUNTER — Telehealth: Payer: Self-pay | Admitting: Internal Medicine

## 2017-11-15 NOTE — Addendum Note (Signed)
Addended by: Alvis Lemmings C on: 11/15/2017 09:53 AM   Modules accepted: Orders

## 2017-11-15 NOTE — Telephone Encounter (Signed)
Lmov for patient to schedule ° ° °

## 2017-11-15 NOTE — Telephone Encounter (Signed)
-----   Message from Emily Filbert, RN sent at 11/14/2017  5:15 PM EDT ----- Marykay Lex ladies,   This was Gerald Stabs' last patient today- she needs a GXT for flecainide in 2 weeks (preferrably on a day Dr. Caryl Comes is here).  Please call to arrange.  Thanks!

## 2017-11-19 ENCOUNTER — Telehealth: Payer: Self-pay | Admitting: Internal Medicine

## 2017-11-19 NOTE — Telephone Encounter (Signed)
Left message on pt's cell VM with GXT instructions.

## 2017-11-20 ENCOUNTER — Ambulatory Visit (INDEPENDENT_AMBULATORY_CARE_PROVIDER_SITE_OTHER): Payer: Medicare Other

## 2017-11-20 DIAGNOSIS — I480 Paroxysmal atrial fibrillation: Secondary | ICD-10-CM

## 2017-12-12 DIAGNOSIS — Z0000 Encounter for general adult medical examination without abnormal findings: Secondary | ICD-10-CM | POA: Diagnosis not present

## 2017-12-20 ENCOUNTER — Encounter: Payer: Self-pay | Admitting: Internal Medicine

## 2017-12-20 ENCOUNTER — Ambulatory Visit: Payer: Medicare Other | Admitting: Internal Medicine

## 2017-12-20 VITALS — BP 134/62 | HR 96 | Ht 64.75 in | Wt 186.5 lb

## 2017-12-20 DIAGNOSIS — I10 Essential (primary) hypertension: Secondary | ICD-10-CM | POA: Diagnosis not present

## 2017-12-20 DIAGNOSIS — I480 Paroxysmal atrial fibrillation: Secondary | ICD-10-CM | POA: Diagnosis not present

## 2017-12-20 NOTE — Patient Instructions (Signed)
Medication Instructions: - Your physician recommends that you continue on your current medications as directed. Please refer to the Current Medication list given to you today.  Labwork: - none ordered  Procedures/Testing: - none ordered  Follow-Up: - Your physician recommends that you schedule a follow-up appointment in: 4-5 weeks with Dr. Caryl Comes.   Any Additional Special Instructions Will Be Listed Below (If Applicable).     If you need a refill on your cardiac medications before your next appointment, please call your pharmacy.

## 2017-12-20 NOTE — Progress Notes (Signed)
Patient Care Team: Crecencio Mc, MD as PCP - General (Internal Medicine) Deboraha Sprang, MD as PCP - Cardiology (Cardiology)   HPI  Katherine Hudson is a 72 y.o. female Seen in follow-up for atrial flutter. She presented with an atypical flutter but without an antecedent history of cardiac instrumentation. It was presumed to be reverse typical flutter and 6/18 she underwent catheter ablation. Unfortunately, during the procedure she reverted spontaneously to a left atrial flutter. Flecainide therapy was initiated. However, on 2 occasions she has presented for flecainide assessment on treadmill testing and been found to be in atrial fibrillation.     DATE TEST    6/18    Echo   EF 60-65 % LAE (43/2.2/44)         Date Cr K Hgb  6/18 1.25  14.5   11/18 0.93 4.2 15.3   DATE PR interval QRSduration Dose-Flecainide  4/18  146 70 0  7/18 160 76 100          Because of symptoms of shortness of breath and dizziness attributed to her atrial arrhythmias, she saw f Dr. Greggory Brandy 1 11/18 for consideration of ablation.  At that time, he noted that she was taking her flecainide once daily, she was having few symptoms, and she was not interested in pursuing ablation  She continues to struggle with shortness of breath and dizziness.  She wonders whether these are effects from the medication.  She is having no bleeding issues.  She does not have edema.  She uses her home monitor which is insensitive in detecting atrial fibrillation (see below)    Records and Results Reviewed  EP study notes     Past Medical History:  Diagnosis Date  . Depression   . History of echocardiogram    a. 01/2017 Echo: EF 60-65%, no rwma, mild MR, mildly to mod dil LA. nl RV fxn.  . Hyperlipidemia   . Hypertension   . Left atrial flutter by electrocardiogram [facility])    a. demonstrated by Dr Lovena Le on EPS 6/18, not ablated.  . Paroxysmal atrial fibrillation (HCC)    a. CHA2DS2VASc = 3-->Xarelto.  . Typical  atrial flutter [facility])    s/p CTI ablation by Dr Lovena Le 6/18    Past Surgical History:  Procedure Laterality Date  . A-FLUTTER ABLATION N/A 02/02/2017   CTI ablation by Dr Lovena Le for typical atrial flutter.  Left atrial flutter also observed and cardioverted  . APPENDECTOMY    . KNEE SURGERY  2000   right  . ROTATOR CUFF REPAIR  1998   right    Current Outpatient Medications  Medication Sig Dispense Refill  . cetirizine (ZYRTEC) 10 MG tablet Take 10 mg by mouth daily as needed for allergies.    . Cholecalciferol (VITAMIN D3) 1000 units CAPS Take 1,000 Units by mouth daily.    Marland Kitchen CINNAMON PO Take 2,000 mg by mouth daily.    . Coenzyme Q10 (CO Q-10) 100 MG CAPS Take 100 mg by mouth daily.    . Cranberry 200 MG CAPS Take 200 mg by mouth 2 (two) times daily.     Marland Kitchen diltiazem (CARDIZEM CD) 180 MG 24 hr capsule Take 1 capsule (180 mg total) by mouth daily. 90 capsule 3  . diphenhydrAMINE (BENADRYL) 2 % cream Apply 1 application topically as needed (bug bites).    . flecainide (TAMBOCOR) 50 MG tablet Take 1 tablet (50 mg total) by mouth 2 (two) times daily.  180 tablet 3  . fluticasone (FLONASE) 50 MCG/ACT nasal spray Place 2 sprays at bedtime into both nostrils.    Javier Docker Oil 500 MG CAPS Take 500 mg by mouth daily.    . Melatonin 10 MG TABS Take 10 mg by mouth at bedtime as needed (sleep).    . Multiple Vitamin (MULTIVITAMIN) tablet Take 1 tablet by mouth daily.      . Naphazoline HCl (CLEAR EYES OP) Apply 1 drop to eye daily as needed (irritation).    . naproxen sodium (ALEVE) 220 MG tablet Take 220 mg daily as needed by mouth.    . omega-3 acid ethyl esters (LOVAZA) 1 g capsule Take 2 g daily by mouth.    . oxybutynin (OXYTROL) 3.9 MG/24HR Place 1 patch onto the skin See admin instructions. Every 4 days    . pramoxine (PROCTOFOAM) 1 % foam Place 1 application rectally 3 (three) times daily as needed for itching. 15 g 0  . Probiotic Product (PROBIOTIC PO) Take 1 tablet by mouth daily.    .  rivaroxaban (XARELTO) 20 MG TABS tablet Take 1 tablet (20 mg total) by mouth daily with supper. 30 tablet 6  . sertraline (ZOLOFT) 50 MG tablet TAKE THREE TABLETS BY MOUTH ONCE DAILY 270 tablet 1  . sodium chloride (OCEAN) 0.65 % SOLN nasal spray Place 1 spray into both nostrils as needed for congestion.    Marland Kitchen telmisartan (MICARDIS) 80 MG tablet Take 80 mg by mouth daily.  6  . TURMERIC PO Take 1 capsule by mouth daily.     No current facility-administered medications for this visit.     Allergies  Allergen Reactions  . Penicillins Other (See Comments)    As a child-unknown Has patient had a PCN reaction causing immediate rash, facial/tongue/throat swelling, SOB or lightheadedness with hypotension: Unknown Has patient had a PCN reaction causing severe rash involving mucus membranes or skin necrosis: Unknown Has patient had a PCN reaction that required hospitalization: No Has patient had a PCN reaction occurring within the last 10 years: No If all of the above answers are "NO", then may proceed with Cephalosporin use.  Marland Kitchen Percocet [Oxycodone-Acetaminophen] Other (See Comments)    Hallucinations   . Latex Rash and Other (See Comments)    Knee scabbed over after using bandage      Review of Systems negative except from HPI and PMH  Physical Exam BP 134/62 (BP Location: Left Arm, Patient Position: Sitting, Cuff Size: Normal)   Pulse 96   Ht 5' 4.75" (1.645 m)   Wt 186 lb 8 oz (84.6 kg)   BMI 31.28 kg/m  Well developed and nourished in no acute distress HENT normal Neck supple with JVP-flat Carotids brisk and full without bruits Clear Irregularly irregular rate and rhythm with rapid ventricular response, no murmurs or gallops Abd-soft with active BS without hepatomegaly No Clubbing cyanosis edema Skin-warm and dry A & Oriented  Grossly normal sensory and motor function  ECG demonstrates atrial atrial flutter/fibrillation at a rate of 110  Assessment and  Plan  Atrial  flutter-atypical and reverse typical  Atrial fibrillation/left atrial flutters  Dizziness  DOE  Hypotension/hypertension  Bradycardia question iatrogenic chronotropic incompetence  She continues to have atrial fibrillation/flutter, in which rhythm we find her today.  However, her monitor home today failed to detect this   It is not clear whether her dizziness is related to medications or her arrhythmia or hypotension.  Blood pressures on her records have not been  all at low so I suspect it is not the latter.  The former 2 possibilities are the greater.  She is taking her flecainide now correctly.  We will attempt to try to elucidate the frequency of the atrial arrhythmia by palpation.  We have reviewed that extensively this morning.  She is able to detect the difference between her arrhythmia and my sinus.  We will plan to review this in 4-6 weeks and make a decision at that point as to the pursuit of regular rhythm  On Anticoagulation;  No bleeding issues   We spent more than 50% of our >25 min visit in face to face counseling regarding the above     Current medicines are reviewed at length with the patient today .  The patient does not  have concerns regarding medicines.

## 2018-01-08 ENCOUNTER — Other Ambulatory Visit: Payer: Self-pay | Admitting: Internal Medicine

## 2018-01-14 ENCOUNTER — Telehealth: Payer: Self-pay

## 2018-01-14 ENCOUNTER — Other Ambulatory Visit: Payer: Self-pay | Admitting: Internal Medicine

## 2018-01-14 NOTE — Telephone Encounter (Signed)
Spoke with pt and she has been scheduled for tomorrow at 2pm. Pt is aware of appt date and time.

## 2018-01-14 NOTE — Telephone Encounter (Signed)
Copied from [place] [id number]. Topic: Appointment Scheduling - Scheduling Inquiry for Clinic >> Jan 14, 2018 10:23 AM Scherrie Gerlach wrote: Reason for CRM: pt has tenderness in right breast and needs referral for diagnostic mammogram. [facility] is requesting referral. No appts at the [place] [facility].  Too far for pt to travel to [place]. Pt needs to be seen asap. Please advise Please make sure you call the [phone] number, as the other is her husbands number, and she does not want him to know.

## 2018-01-14 NOTE — Telephone Encounter (Signed)
Patient needs to be seen.

## 2018-01-14 NOTE — Telephone Encounter (Signed)
Does pt need to come in for an appt before a diagnostic mammogram can be ordered?

## 2018-01-15 ENCOUNTER — Encounter: Payer: Self-pay | Admitting: Internal Medicine

## 2018-01-15 ENCOUNTER — Ambulatory Visit: Payer: Medicare Other | Admitting: Internal Medicine

## 2018-01-15 VITALS — BP 138/78 | HR 49 | Temp 98.3°F | Wt 184.5 lb

## 2018-01-15 DIAGNOSIS — I10 Essential (primary) hypertension: Secondary | ICD-10-CM | POA: Diagnosis not present

## 2018-01-15 DIAGNOSIS — E559 Vitamin D deficiency, unspecified: Secondary | ICD-10-CM | POA: Diagnosis not present

## 2018-01-15 DIAGNOSIS — N644 Mastodynia: Secondary | ICD-10-CM

## 2018-01-15 DIAGNOSIS — E782 Mixed hyperlipidemia: Secondary | ICD-10-CM

## 2018-01-15 DIAGNOSIS — Z0000 Encounter for general adult medical examination without abnormal findings: Secondary | ICD-10-CM

## 2018-01-15 DIAGNOSIS — R7303 Prediabetes: Secondary | ICD-10-CM

## 2018-01-15 DIAGNOSIS — E034 Atrophy of thyroid (acquired): Secondary | ICD-10-CM | POA: Diagnosis not present

## 2018-01-15 DIAGNOSIS — Z1239 Encounter for other screening for malignant neoplasm of breast: Secondary | ICD-10-CM

## 2018-01-15 DIAGNOSIS — Z1231 Encounter for screening mammogram for malignant neoplasm of breast: Secondary | ICD-10-CM | POA: Diagnosis not present

## 2018-01-15 LAB — LIPID PANEL
CHOL/HDL RATIO: 8
CHOLESTEROL: 247 mg/dL — AB (ref 0–200)
HDL: 30.7 mg/dL — AB (ref >39.00)
NONHDL: 216.02
TRIGLYCERIDES: 304 mg/dL — AB (ref 0.0–149.0)
VLDL: 60.8 mg/dL — AB (ref 0.0–40.0)

## 2018-01-15 LAB — COMPREHENSIVE METABOLIC PANEL
ALT: 15 U/L (ref 0–35)
AST: 19 U/L (ref 0–37)
Albumin: 4.7 g/dL (ref 3.5–5.2)
Alkaline Phosphatase: 70 U/L (ref 39–117)
BUN: 17 mg/dL (ref 6–23)
CALCIUM: 9.9 mg/dL (ref 8.4–10.5)
CO2: 25 meq/L (ref 19–32)
Chloride: 105 mEq/L (ref 96–112)
Creatinine, Ser: 0.88 mg/dL (ref 0.40–1.20)
GFR: 67.13 mL/min (ref >60.00)
GLUCOSE: 106 mg/dL — AB (ref 70–99)
POTASSIUM: 4.1 meq/L (ref 3.5–5.1)
Sodium: 139 mEq/L (ref 135–145)
Total Bilirubin: 1 mg/dL (ref 0.2–1.2)
Total Protein: 7.5 g/dL (ref 6.0–8.3)

## 2018-01-15 LAB — TSH: TSH: 1.9 u[IU]/mL (ref 0.35–4.50)

## 2018-01-15 LAB — VITAMIN D 25 HYDROXY (VIT D DEFICIENCY, FRACTURES): VITD: 31.45 ng/mL (ref 30.00–100.00)

## 2018-01-15 LAB — LDL CHOLESTEROL, DIRECT: LDL DIRECT: 147 mg/dL

## 2018-01-15 LAB — HEMOGLOBIN A1C: Hgb A1c MFr Bld: 5.9 % (ref 4.6–6.5)

## 2018-01-15 NOTE — Patient Instructions (Addendum)
I do not feel any lumps in your breasts,  So I have ordered th eusual mammogram  Given your history of statin intolerance, I would like to suggest a trial of an injectible medication that has become available through pior authorization called "repatha." this medication is self administered as a subCutaneous injection either on a monthly basis or on a biweekly basis. It is a monclonal antibody so it works very differently than statins, and lowers cholesterol very rapidly (by the end of the first week . I will admit I have not prescribed the medication yet to anyone else, But the cardiologists have. The side effect profile is pretty mild (itching And runny nose are the most common, both are reported in In less than ten percent ).    Increase your telmisartan from 80 mg  to 160 mg  Daily   If your systolic drops below 151,  We will need to make another change.  continue diltiazem 180 mg capsule daily

## 2018-01-15 NOTE — Progress Notes (Signed)
Patient ID: Katherine Hudson, female    DOB: 19-Jan-1946  Age: 72 y.o. MRN: [id number]  The patient is here for annual preventive examination and management of other chronic and acute problems.   The risk factors are reflected in the social history.  The roster of all physicians providing medical care to patient - is listed in the Snapshot section of the chart.  Activities of daily living:  The patient is 100% independent in all ADLs: dressing, toileting, feeding as well as independent mobility  Home safety : The patient has smoke detectors in the home. They wear seatbelts.  There are no firearms at home. There is no violence in the home.   There is no risks for hepatitis, STDs or HIV. There is no   history of blood transfusion. They have no travel history to infectious disease endemic areas of the world.  The patient has seen their dentist in the last six month. They have seen their eye doctor in the last year. They admit to slight hearing difficulty with regard to whispered voices and some television programs.  They have deferred audiologic testing in the last year.  They do not  have excessive sun exposure. Discussed the need for sun protection: hats, long sleeves and use of sunscreen if there is significant sun exposure.   Diet: the importance of a healthy diet is discussed. They do have a healthy diet.  The benefits of regular aerobic exercise were discussed. She walks 4 times per week ,  20 minutes.   Depression screen: there are no signs or vegative symptoms of depression- irritability, change in appetite, anhedonia, sadness/tearfullness.  Cognitive assessment: the patient manages all their financial and personal affairs and is actively engaged. They could relate day,date,year and events; recalled 2/3 objects at 3 minutes; performed clock-face test normally.  The following portions of the patient's history were reviewed and updated as appropriate: allergies, current medications, past  family history, past medical history,  past surgical history, past social history  and problem list.  Visual acuity was not assessed per patient preference since she has regular follow up with her ophthalmologist. Hearing and body mass index were assessed and reviewed.   During the course of the visit the patient was educated and counseled about appropriate screening and preventive services including : fall prevention , diabetes screening, nutrition counseling, colorectal cancer screening, and recommended immunizations.    CC: The primary encounter diagnosis was Breast cancer screening. Diagnoses of Hypovitaminosis D, Prediabetes, Essential hypertension, Hypothyroidism due to acquired atrophy of thyroid, Breast pain, right, Mixed hyperlipidemia, and Encounter for preventive health examination were also pertinent to this visit.  Evaluation of right breast tenderness that occurred  About ten days ago without injury . Symptoms have improved spontaneously   Over the last several days,  But when she raises her right arm she feels a pulling sensation  In the right breast. No recent viral URI,  but has been sneezing more than usual.  Using flonase .  No new meds other than telmisartan .  No new supplements   Has been dong some repetitive lifting of bricks and bushels of weed   HTN:  Started taking telmisartan last month.  Home readings have been elevated,  Systolic has been as high as 156,  The low is 124     pulse has been lower since starting tambocor   Statin intolerant , refuses to resume simvastatin bc muscle aches resolved.  willing to try Crestor if insurance will not  pay for repatha without another trial    History Katherine Hudson has a past medical history of Depression, History of echocardiogram, Hyperlipidemia, Hypertension, Left atrial flutter by electrocardiogram [facility]), Paroxysmal atrial fibrillation ([place]), and Typical atrial flutter ([place]).   She has a past surgical history that includes  Appendectomy; Knee surgery (2000); Rotator cuff repair (1998); and A-FLUTTER ABLATION (N/A, 02/02/2017).   Her family history includes Cancer in her father and mother; Stroke in her sister.She reports that she has quit smoking. She quit after 10.00 years of use. She has never used smokeless tobacco. She reports that she drinks alcohol. She reports that she does not use drugs.  Outpatient Medications Prior to Visit  Medication Sig Dispense Refill  . cetirizine (ZYRTEC) 10 MG tablet Take 10 mg by mouth daily as needed for allergies.    . Cholecalciferol (VITAMIN D3) 1000 units CAPS Take 1,000 Units by mouth daily.    Marland Kitchen CINNAMON PO Take 2,000 mg by mouth daily.    . Coenzyme Q10 (CO Q-10) 100 MG CAPS Take 100 mg by mouth daily.    . Cranberry 200 MG CAPS Take 200 mg by mouth 2 (two) times daily.     Marland Kitchen diltiazem (CARDIZEM CD) 180 MG 24 hr capsule Take 1 capsule (180 mg total) by mouth daily. 90 capsule 3  . diphenhydrAMINE (BENADRYL) 2 % cream Apply 1 application topically as needed (bug bites).    . flecainide (TAMBOCOR) 50 MG tablet Take 1 tablet (50 mg total) by mouth 2 (two) times daily. 180 tablet 3  . fluticasone (FLONASE) 50 MCG/ACT nasal spray Place 2 sprays at bedtime into both nostrils.    Javier Docker Oil 500 MG CAPS Take 500 mg by mouth daily.    . Melatonin 10 MG TABS Take 10 mg by mouth at bedtime as needed (sleep).    . Multiple Vitamin (MULTIVITAMIN) tablet Take 1 tablet by mouth daily.      . Naphazoline HCl (CLEAR EYES OP) Apply 1 drop to eye daily as needed (irritation).    . naproxen sodium (ALEVE) 220 MG tablet Take 220 mg daily as needed by mouth.    . omega-3 acid ethyl esters (LOVAZA) 1 g capsule Take 2 g daily by mouth.    . oxybutynin (OXYTROL) 3.9 MG/24HR Place 1 patch onto the skin See admin instructions. Every 4 days    . pramoxine (PROCTOFOAM) 1 % foam Place 1 application rectally 3 (three) times daily as needed for itching. 15 g 0  . Probiotic Product (PROBIOTIC PO) Take 1  tablet by mouth daily.    . rivaroxaban (XARELTO) 20 MG TABS tablet Take 1 tablet (20 mg total) by mouth daily with supper. 30 tablet 6  . sertraline (ZOLOFT) 50 MG tablet TAKE THREE TABLETS BY MOUTH ONCE DAILY 270 tablet 1  . sertraline (ZOLOFT) 50 MG tablet TAKE 3 TABLETS BY MOUTH ONCE DAILY 270 tablet 0  . sodium chloride (OCEAN) 0.65 % SOLN nasal spray Place 1 spray into both nostrils as needed for congestion.    Marland Kitchen telmisartan (MICARDIS) 80 MG tablet Take 80 mg by mouth daily.  6  . TURMERIC PO Take 1 capsule by mouth daily.     No facility-administered medications prior to visit.     Review of Systems   Patient denies headache, fevers, malaise, unintentional weight loss, skin rash, eye pain, sinus congestion and sinus pain, sore throat, dysphagia,  hemoptysis , cough, dyspnea, wheezing, chest pain, palpitations, orthopnea, edema, abdominal pain, nausea, melena,  diarrhea, constipation, flank pain, dysuria, hematuria, urinary  Frequency, nocturia, numbness, tingling, seizures,  Focal weakness, Loss of consciousness,  Tremor, insomnia, depression, anxiety, and suicidal ideation.      Objective:  BP 138/78 (BP Location: Left Arm, Patient Position: Sitting, Cuff Size: Normal)   Pulse (!) 49   Temp 98.3 F (36.8 C) (Oral)   Wt 184 lb 8 oz (83.7 kg)   SpO2 98%   BMI 30.94 kg/m   Physical Exam   General appearance: alert, cooperative and appears stated age Head: Normocephalic, without obvious abnormality, atraumatic Eyes: conjunctivae/corneas clear. PERRL, EOM's intact. Fundi benign. Ears: normal TM's and external ear canals both ears Nose: Nares normal. Septum midline. Mucosa normal. No drainage or sinus tenderness. Throat: lips, mucosa, and tongue normal; teeth and gums normal Neck: no adenopathy, no carotid bruit, no JVD, supple, symmetrical, trachea midline and thyroid not enlarged, symmetric, no tenderness/mass/nodules Lungs: clear to auscultation bilaterally Breasts:  pendulous,  No breast masses appreciated Heart: regular rate and rhythm, S1, S2 normal, no murmur, click, rub or gallop Abdomen: soft, non-tender; bowel sounds normal; no masses,  no organomegaly Extremities: extremities normal, atraumatic, no cyanosis or edema Pulses: 2+ and symmetric Skin: Skin color, texture, turgor normal. No rashes or lesions Neurologic: Alert and oriented X 3, normal strength and tone. Normal symmetric reflexes. Normal coordination and gait.      Assessment & Plan:   Problem List Items Addressed This Visit    Hypovitaminosis D   Relevant Orders   VITAMIN D 25 Hydroxy (Vit-D Deficiency, Fractures) (Completed)   Hypothyroidism due to acquired atrophy of thyroid   Relevant Orders   TSH (Completed)   Prediabetes    Her A1c is  Lower  but still suggestive  Pre prediabetes .  I recommend she follow a low glycemic index diet and particpate regularly in an aerobic  exercise activity.  We should check an A1c in 6 months.   Lab Results  Component Value Date   HGBA1C 5.9 01/15/2018        Relevant Orders   Lipid panel (Completed)   Hemoglobin A1c (Completed)   Microalbumin / creatinine urine ratio (Completed)   Mixed hyperlipidemia    Untreated due to persistent myalgias while taking simvastatin.  She is willing to try  Repatha   Lab Results  Component Value Date   CHOL 247 (H) 01/15/2018   HDL 30.70 (L) 01/15/2018   LDLCALC 180 (H) 06/08/2017   LDLDIRECT 147.0 01/15/2018   TRIG 304.0 (H) 01/15/2018   CHOLHDL 8 01/15/2018   Lab Results  Component Value Date   ALT 15 01/15/2018   AST 19 01/15/2018   ALKPHOS 70 01/15/2018   BILITOT 1.0 01/15/2018           Essential hypertension    Increasing telmisartan dose       Relevant Orders   Comprehensive metabolic panel (Completed)   Encounter for preventive health examination    Annual comprehensive preventive exam was done as well as an evaluation and management of chronic conditions .  During the  course of the visit the patient was educated and counseled about appropriate screening and preventive services including :  diabetes screening, lipid analysis with projected  10 year  risk for CAD , nutrition counseling, breast, cervical and colorectal cancer screening, and recommended immunizations.  Printed recommendations for health maintenance screenings was given      Breast pain, right    Resolving,  With normal breast exam today.screening 3d mMMOGRAM  ORDERED        Other Visit Diagnoses    Breast cancer screening    -  Primary   Relevant Orders   MM 3D SCREEN BREAST BILATERAL      I am having Rosette Reveal maintain her multivitamin, TURMERIC PO, Probiotic Product (PROBIOTIC PO), CINNAMON PO, Cranberry, oxybutynin, pramoxine, Vitamin D3, Krill Oil, Co Q-10, Melatonin, cetirizine, sodium chloride, Naphazoline HCl (CLEAR EYES OP), diphenhydrAMINE, sertraline, fluticasone, naproxen sodium, omega-3 acid ethyl esters, rivaroxaban, flecainide, diltiazem, telmisartan, and sertraline.  No orders of the defined types were placed in this encounter.   There are no discontinued medications.  Follow-up: No follow-ups on file.   Crecencio Mc, MD

## 2018-01-16 DIAGNOSIS — N644 Mastodynia: Secondary | ICD-10-CM | POA: Insufficient documentation

## 2018-01-16 LAB — MICROALBUMIN / CREATININE URINE RATIO
CREATININE, U: 187.9 mg/dL
MICROALB UR: 1 mg/dL (ref 0.0–1.9)
MICROALB/CREAT RATIO: 0.6 mg/g (ref 0.0–30.0)

## 2018-01-16 MED ORDER — EVOLOCUMAB 140 MG/ML ~~LOC~~ SOAJ
140.0000 mg | SUBCUTANEOUS | 11 refills | Status: DC
Start: 2018-01-16 — End: 2018-07-11

## 2018-01-16 NOTE — Assessment & Plan Note (Signed)
Annual comprehensive preventive exam was done as well as an evaluation and management of chronic conditions .  During the course of the visit the patient was educated and counseled about appropriate screening and preventive services including :  diabetes screening, lipid analysis with projected  10 year  risk for CAD , nutrition counseling, breast, cervical and colorectal cancer screening, and recommended immunizations.  Printed recommendations for health maintenance screenings was given 

## 2018-01-16 NOTE — Assessment & Plan Note (Addendum)
Untreated due to persistent myalgias while taking simvastatin.  She is willing to try  Repatha   Lab Results  Component Value Date   CHOL 247 (H) 01/15/2018   HDL 30.70 (L) 01/15/2018   LDLCALC 180 (H) 06/08/2017   LDLDIRECT 147.0 01/15/2018   TRIG 304.0 (H) 01/15/2018   CHOLHDL 8 01/15/2018   Lab Results  Component Value Date   ALT 15 01/15/2018   AST 19 01/15/2018   ALKPHOS 70 01/15/2018   BILITOT 1.0 01/15/2018

## 2018-01-16 NOTE — Assessment & Plan Note (Signed)
Her A1c is  Lower  but still suggestive  Pre prediabetes .  I recommend she follow a low glycemic index diet and particpate regularly in an aerobic  exercise activity.  We should check an A1c in 6 months.   Lab Results  Component Value Date   HGBA1C 5.9 01/15/2018

## 2018-01-16 NOTE — Assessment & Plan Note (Signed)
Resolving,  With normal breast exam today.screening 3d mMMOGRAM ORDERED

## 2018-01-16 NOTE — Assessment & Plan Note (Signed)
Increasing telmisartan dose

## 2018-01-17 ENCOUNTER — Ambulatory Visit: Payer: Medicare Other | Admitting: Internal Medicine

## 2018-01-17 ENCOUNTER — Encounter: Payer: Self-pay | Admitting: Internal Medicine

## 2018-01-17 VITALS — BP 140/64 | HR 48 | Ht 65.0 in | Wt 184.0 lb

## 2018-01-17 DIAGNOSIS — I480 Paroxysmal atrial fibrillation: Secondary | ICD-10-CM

## 2018-01-17 DIAGNOSIS — R0609 Other forms of dyspnea: Secondary | ICD-10-CM

## 2018-01-17 DIAGNOSIS — R001 Bradycardia, unspecified: Secondary | ICD-10-CM

## 2018-01-17 MED ORDER — DILTIAZEM HCL ER COATED BEADS 120 MG PO CP24: 120.0000 mg | ORAL_CAPSULE | Freq: Every day | ORAL | 3 refills | Status: DC

## 2018-01-17 NOTE — Progress Notes (Signed)
Patient Care Team: Crecencio Mc, MD as PCP - General (Internal Medicine) Deboraha Sprang, MD as PCP - Cardiology (Cardiology)   HPI  Katherine Hudson is a 72 y.o. female Seen in follow-up for atrial flutter. She presented with an atypical flutter but without an antecedent history of cardiac instrumentation. It was presumed to be reverse typical flutter and 6/18 she underwent catheter ablation. Unfortunately, during the procedure she reverted spontaneously to a left atrial flutter. Flecainide therapy was initiated. However, on 2 occasions she has presented for flecainide assessment on treadmill testing and been found to be in atrial fibrillation.     DATE TEST    6/18    Echo   EF 60-65 % LAE (43/2.2/44)         Date Cr K Hgb  6/18 1.25  14.5   11/18 0.93 4.2 15.3   DATE PR interval QRSduration Dose-Flecainide  4/18  146 70 0  7/18 160 76 100 (qd)  6/19 172 72 50 (bid)     Because of symptoms of shortness of breath and dizziness attributed to her atrial arrhythmias, she saw Dr. Greggory Brandy  11/18 for consideration of ablation.  At that time, he noted that she was taking her flecainide once daily, she was having few symptoms, and she was not interested in pursuing ablation.  Since then she has been taking the flecainide 2/day and has had no recurrent palps or headaches  GXT-- peak HR  96 (64% predicted max)  Less sob, no chest pain or edema; some fatigue  BP at home has been elevated and her PCP last week increased meds         Past Medical History:  Diagnosis Date  . Depression   . History of echocardiogram    a. 01/2017 Echo: EF 60-65%, no rwma, mild MR, mildly to mod dil LA. nl RV fxn.  . Hyperlipidemia   . Hypertension   . Left atrial flutter by electrocardiogram [facility])    a. demonstrated by Dr Lovena Le on EPS 6/18, not ablated.  . Paroxysmal atrial fibrillation (HCC)    a. CHA2DS2VASc = 3-->Xarelto.  . Typical atrial flutter [facility])    s/p CTI ablation by Dr  Lovena Le 6/18    Past Surgical History:  Procedure Laterality Date  . A-FLUTTER ABLATION N/A 02/02/2017   CTI ablation by Dr Lovena Le for typical atrial flutter.  Left atrial flutter also observed and cardioverted  . APPENDECTOMY    . KNEE SURGERY  2000   right  . ROTATOR CUFF REPAIR  1998   right    Current Outpatient Medications  Medication Sig Dispense Refill  . cetirizine (ZYRTEC) 10 MG tablet Take 10 mg by mouth daily as needed for allergies.    . Cholecalciferol (VITAMIN D3) 1000 units CAPS Take 1,000 Units by mouth daily.    Marland Kitchen CINNAMON PO Take 2,000 mg by mouth daily.    . Coenzyme Q10 (CO Q-10) 100 MG CAPS Take 100 mg by mouth daily.    . Cranberry 200 MG CAPS Take 200 mg by mouth 2 (two) times daily.     . diphenhydrAMINE (BENADRYL) 2 % cream Apply 1 application topically as needed (bug bites).    . Evolocumab (REPATHA SURECLICK) 893 MG/ML SOAJ Inject 140 mg into the skin every 14 (fourteen) days. 2 mL 11  . flecainide (TAMBOCOR) 50 MG tablet Take 1 tablet (50 mg total) by mouth 2 (two) times daily. 180 tablet 3  .  fluticasone (FLONASE) 50 MCG/ACT nasal spray Place 2 sprays at bedtime into both nostrils.    Javier Docker Oil 500 MG CAPS Take 500 mg by mouth daily.    . Melatonin 10 MG TABS Take 10 mg by mouth at bedtime as needed (sleep).    . Multiple Vitamin (MULTIVITAMIN) tablet Take 1 tablet by mouth daily.      . Naphazoline HCl (CLEAR EYES OP) Apply 1 drop to eye daily as needed (irritation).    . naproxen sodium (ALEVE) 220 MG tablet Take 220 mg daily as needed by mouth.    . omega-3 acid ethyl esters (LOVAZA) 1 g capsule Take 2 g daily by mouth.    . oxybutynin (OXYTROL) 3.9 MG/24HR Place 1 patch onto the skin See admin instructions. Every 4 days    . pramoxine (PROCTOFOAM) 1 % foam Place 1 application rectally 3 (three) times daily as needed for itching. 15 g 0  . Probiotic Product (PROBIOTIC PO) Take 1 tablet by mouth daily.    . rivaroxaban (XARELTO) 20 MG TABS tablet Take  1 tablet (20 mg total) by mouth daily with supper. 30 tablet 6  . sertraline (ZOLOFT) 50 MG tablet Take 100 mg by mouth 2 (two) times daily.     . sodium chloride (OCEAN) 0.65 % SOLN nasal spray Place 1 spray into both nostrils as needed for congestion.    Marland Kitchen telmisartan (MICARDIS) 80 MG tablet Take 80 mg by mouth daily.  6  . TURMERIC PO Take 1 capsule by mouth daily.    Marland Kitchen diltiazem (CARDIZEM CD) 120 MG 24 hr capsule Take 1 capsule (120 mg total) by mouth daily. 90 capsule 3   No current facility-administered medications for this visit.     Allergies  Allergen Reactions  . Penicillins Other (See Comments)    As a child-unknown Has patient had a PCN reaction causing immediate rash, facial/tongue/throat swelling, SOB or lightheadedness with hypotension: Unknown Has patient had a PCN reaction causing severe rash involving mucus membranes or skin necrosis: Unknown Has patient had a PCN reaction that required hospitalization: No Has patient had a PCN reaction occurring within the last 10 years: No If all of the above answers are "NO", then may proceed with Cephalosporin use.  Marland Kitchen Percocet [Oxycodone-Acetaminophen] Other (See Comments)    Hallucinations   . Latex Rash and Other (See Comments)    Knee scabbed over after using bandage      Review of Systems negative except from HPI and PMH  Physical Exam BP 140/64 (BP Location: Left Arm, Patient Position: Sitting, Cuff Size: Normal)   Pulse (!) 48   Ht 5\' 5"  (1.651 m)   Wt 184 lb (83.5 kg)   BMI 30.62 kg/m  Well developed and nourished in no acute distress HENT normal Neck supple with JVP-flat Carotids brisk and full without bruits Clear Regular rate and rhythm, no murmurs or gallops Abd-soft with active BS without hepatomegaly No Clubbing cyanosis edema Skin-warm and dry A & Oriented  Grossly normal sensory and motor function   ECG demonstrates sinus 48 17/07/48  Assessment and  Plan  Atrial flutter-atypical and reverse  typical  Atrial fibrillation/left atrial flutters  Dizziness improved  DOE  Hypotension/hypertension  Bradycardia ? 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  The patient does not  have concerns regarding medicines.

## 2018-01-17 NOTE — Patient Instructions (Signed)
Medication Instructions:  Your physician has recommended you make the following change in your medication:  1- DECREASE Cardizem to 120 mg by mouth once a day.    Labwork: NONE  Testing/Procedures: NONE  Follow-Up: Your physician wants you to follow-up in: [place]. You will receive a reminder letter in the mail two months in advance. If you don't receive a letter, please call our office to schedule the follow-up appointment.   If you need a refill on your cardiac medications before your next appointment, please call your pharmacy.

## 2018-01-23 ENCOUNTER — Telehealth: Payer: Self-pay | Admitting: *Deleted

## 2018-01-23 NOTE — Progress Notes (Signed)
PA completed on Cover My Meds reference key: Kelle Ruppert (Key: [id number])

## 2018-01-23 NOTE — Telephone Encounter (Signed)
Jodean Lima (Key: [id number])  PA completed on Cover My meds for Repatha.

## 2018-01-23 NOTE — Telephone Encounter (Signed)
-----   Message from Crecencio Mc, MD sent at 01/16/2018  9:05 PM EDT ----- Repatha ordered

## 2018-01-24 NOTE — Telephone Encounter (Signed)
PA for Repatha has been denied by insurance.  

## 2018-01-24 NOTE — Telephone Encounter (Signed)
Her insurance denied Repatha PA.  Is she willing to try Zetia?  It lowers cholesterol but is NOT  A Statin .  tkaen once daily as a pill.

## 2018-01-28 LAB — EXERCISE TOLERANCE TEST
CHL CUP MPHR: 149 {beats}/min
CSEPHR: 64 %
CSEPPHR: 96 {beats}/min
Estimated workload: 7 METS
Exercise duration (min): 3 min
Exercise duration (sec): 30 s
Rest HR: 69 {beats}/min

## 2018-01-28 NOTE — Telephone Encounter (Signed)
LMTCB. PEC may speak with pt.  

## 2018-02-01 NOTE — Telephone Encounter (Signed)
Spoke with pt to let her know that the Repatha was denied by the insurance. Pt stated that she received the letter from her insurance company. Asked the pt if she would like to try a medication called Zetia. Pt stated that she is on her way to [place] and will give [place] a call back once she gets back to let us know what she wants to do.

## 2018-02-10 DIAGNOSIS — M25521 Pain in right elbow: Secondary | ICD-10-CM | POA: Diagnosis not present

## 2018-02-10 DIAGNOSIS — M7021 Olecranon bursitis, right elbow: Secondary | ICD-10-CM | POA: Diagnosis not present

## 2018-02-10 DIAGNOSIS — M7989 Other specified soft tissue disorders: Secondary | ICD-10-CM | POA: Diagnosis not present

## 2018-02-21 ENCOUNTER — Ambulatory Visit: Payer: Medicare Other | Admitting: Family Medicine

## 2018-02-21 VITALS — BP 108/62 | HR 59 | Temp 97.8°F | Resp 18 | Wt 183.8 lb

## 2018-02-21 DIAGNOSIS — M7021 Olecranon bursitis, right elbow: Secondary | ICD-10-CM

## 2018-02-21 NOTE — Progress Notes (Signed)
Subjective:     Patient ID: Katherine Hudson, female    DOB: 28-Oct-1945, 72 y.o.   MRN: [id number]  HPI  Katherine Hudson is a 71 year old female who is here today for right elbow pain that started on 02/10/18. She reports that this symptom started while she was on vacation in [place]. She was evaluated in the ED and stated that she had an X-ray. She provided paperwork from the ED that noted olecranon bursitis of right elbow. ED records from [place] are not available for review.  Pain is rated as a 5  to 6 today and noted as aching and hurting.  No injury or trauma noted.  Treatment: Wrapping it for 4 days however with wrapping she noticed bruising as the wrap was tight and she currently takes Xarelto. She has stopped using the wrap and bruising is improving. She denies any unusual bleeding. No epistaxis, melena, hematuria, hematochezia, or ecchymosis in other areas.  She denies fever, chills, erythema,  or sweats. Associated edema, and mild warmth has been present.  Treatment: CBD has provided moderate benefit per patient. No other treatments have been tried. Aggravating factor: Movement with extension Alleviating factor: Rest    Review of Systems  Constitutional: Negative for chills, fatigue and fever.  Respiratory: Negative for cough, shortness of breath and wheezing.   Cardiovascular: Negative for chest pain and palpitations.  Gastrointestinal: Negative for abdominal pain, diarrhea, nausea and vomiting.  Genitourinary: Negative for dysuria.  Musculoskeletal:       Right elbow pain and swelling  Skin: Negative for rash.  Neurological: Negative for dizziness, weakness and headaches.   Past Medical History:  Diagnosis Date  . Depression   . History of echocardiogram    a. 01/2017 Echo: EF 60-65%, no rwma, mild MR, mildly to mod dil LA. nl RV fxn.  . Hyperlipidemia   . Hypertension   . Left atrial flutter by electrocardiogram [facility])    a. demonstrated by Katherine Hudson on EPS 6/18, not ablated.  .  Paroxysmal atrial fibrillation (HCC)    a. CHA2DS2VASc = 3-->Xarelto.  . Typical atrial flutter [facility])    s/p CTI ablation by Katherine Hudson 6/18     Social History   Socioeconomic History  . Marital status: Married    Spouse name: Not on file  . Number of children: Not on file  . Years of education: Not on file  . Highest education level: Not on file  Occupational History  . Not on file  Social Needs  . Financial resource strain: Not on file  . Food insecurity:    Worry: Not on file    Inability: Not on file  . Transportation needs:    Medical: Not on file    Non-medical: Not on file  Tobacco Use  . Smoking status: Former Smoker    Years: 10.00  . Smokeless tobacco: Never Used  . Tobacco comment: quit 1980  Substance and Sexual Activity  . Alcohol use: Yes    Comment: occassionally  . Drug use: No  . Sexual activity: Not on file  Lifestyle  . Physical activity:    Days per week: Not on file    Minutes per session: Not on file  . Stress: Not on file  Relationships  . Social connections:    Talks on phone: Not on file    Gets together: Not on file    Attends religious service: Not on file    Active member of club or  organization: Not on file    Attends meetings of clubs or organizations: Not on file    Relationship status: Not on file  . Intimate partner violence:    Fear of current or ex partner: Not on file    Emotionally abused: Not on file    Physically abused: Not on file    Forced sexual activity: Not on file  Other Topics Concern  . Not on file  Social History Narrative   Lives in [place]    Past Surgical History:  Procedure Laterality Date  . A-FLUTTER ABLATION N/A 02/02/2017   CTI ablation by Katherine Hudson for typical atrial flutter.  Left atrial flutter also observed and cardioverted  . APPENDECTOMY    . KNEE SURGERY  2000   right  . ROTATOR CUFF REPAIR  1998   right    Family History  Problem Relation Age of Onset  . Cancer Father        lung   . Cancer Mother        cervial  . Stroke Sister     Allergies  Allergen Reactions  . Penicillins Other (See Comments)    As a child-unknown Has patient had a PCN reaction causing immediate rash, facial/tongue/throat swelling, SOB or lightheadedness with hypotension: Unknown Has patient had a PCN reaction causing severe rash involving mucus membranes or skin necrosis: Unknown Has patient had a PCN reaction that required hospitalization: No Has patient had a PCN reaction occurring within the last 10 years: No If all of the above answers are "NO", then may proceed with Cephalosporin use.  Marland Kitchen Percocet [Oxycodone-Acetaminophen] Other (See Comments)    Hallucinations   . Latex Rash and Other (See Comments)    Knee scabbed over after using bandage    Current Outpatient Medications on File Prior to Visit  Medication Sig Dispense Refill  . cetirizine (ZYRTEC) 10 MG tablet Take 10 mg by mouth daily as needed for allergies.    . Cholecalciferol (VITAMIN D3) 1000 units CAPS Take 1,000 Units by mouth daily.    Marland Kitchen CINNAMON PO Take 2,000 mg by mouth daily.    . Coenzyme Q10 (CO Q-10) 100 MG CAPS Take 100 mg by mouth daily.    . Cranberry 200 MG CAPS Take 200 mg by mouth 2 (two) times daily.     Marland Kitchen diltiazem (CARDIZEM CD) 120 MG 24 hr capsule Take 1 capsule (120 mg total) by mouth daily. 90 capsule 3  . diphenhydrAMINE (BENADRYL) 2 % cream Apply 1 application topically as needed (bug bites).    . Evolocumab (REPATHA SURECLICK) 818 MG/ML SOAJ Inject 140 mg into the skin every 14 (fourteen) days. 2 mL 11  . flecainide (TAMBOCOR) 50 MG tablet Take 1 tablet (50 mg total) by mouth 2 (two) times daily. 180 tablet 3  . fluticasone (FLONASE) 50 MCG/ACT nasal spray Place 2 sprays at bedtime into both nostrils.    Javier Docker Oil 500 MG CAPS Take 500 mg by mouth daily.    . Melatonin 10 MG TABS Take 10 mg by mouth at bedtime as needed (sleep).    . Multiple Vitamin (MULTIVITAMIN) tablet Take 1 tablet by mouth  daily.      . Naphazoline HCl (CLEAR EYES OP) Apply 1 drop to eye daily as needed (irritation).    . naproxen sodium (ALEVE) 220 MG tablet Take 220 mg daily as needed by mouth.    . omega-3 acid ethyl esters (LOVAZA) 1 g capsule Take 2 g daily  by mouth.    . oxybutynin (OXYTROL) 3.9 MG/24HR Place 1 patch onto the skin See admin instructions. Every 4 days    . pramoxine (PROCTOFOAM) 1 % foam Place 1 application rectally 3 (three) times daily as needed for itching. 15 g 0  . Probiotic Product (PROBIOTIC PO) Take 1 tablet by mouth daily.    . rivaroxaban (XARELTO) 20 MG TABS tablet Take 1 tablet (20 mg total) by mouth daily with supper. 30 tablet 6  . sertraline (ZOLOFT) 50 MG tablet Take 100 mg by mouth 2 (two) times daily.     . sodium chloride (OCEAN) 0.65 % SOLN nasal spray Place 1 spray into both nostrils as needed for congestion.    Marland Kitchen telmisartan (MICARDIS) 80 MG tablet Take 80 mg by mouth daily.  6  . TURMERIC PO Take 1 capsule by mouth daily.     No current facility-administered medications on file prior to visit.     BP 108/62 (BP Location: Left Arm, Patient Position: Sitting, Cuff Size: Normal)   Pulse (!) 59   Temp 97.8 F (36.6 C) (Oral)   Resp 18   Wt 183 lb 12.8 oz (83.4 kg)   SpO2 97%   BMI 30.59 kg/m       Objective:   Physical Exam  Constitutional: She is oriented to person, place, and time. She appears well-developed and well-nourished.  Eyes: Pupils are equal, round, and reactive to light. No scleral icterus.  Neck: Neck supple.  Cardiovascular: Normal rate and regular rhythm.  Pulmonary/Chest: Effort normal and breath sounds normal. She has no wheezes. She has no rales.  Musculoskeletal:  Tenderness and edema present over bursa of right elbow and pain with motion. She is able to fully extend and flexion/extension causes pain. Areas of ecchymosis present where wrap was located on patient's arm.   Lymphadenopathy:    She has no cervical adenopathy.  Neurological:  She is alert and oriented to person, place, and time.  Skin: Skin is warm and dry.  Psychiatric: She has a normal mood and affect. Her behavior is normal. Judgment and thought content normal.       Assessment & Plan:  1. Olecranon bursitis, right elbow Symptoms are most consistent with bursitis; she is experiencing pain with swelling and has not been able to wear wrap on her arm as she had originally wrapped too tightly and ecchymosis occurred. Ecchymosis is improving however swelling and pain remain. We discussed options for treatment including orthopedic referral and evaluation as aspiration is likely needed. She will go to [facility] today for care. Close return precautions provided.  Delano Metz, FNP-C

## 2018-02-21 NOTE — Patient Instructions (Signed)
Evaluation at orthopedics is recommended.  [facility] is one option that can be considered and they are located at  [place]. [place] [phone].    Bursitis Bursitis is when the fluid-filled sac (bursa) that covers and protects a joint is swollen (inflamed). Bursitis is most common near joints, especially the knees, elbows, hips, and shoulders. Follow these instructions at home:  Take medicines only as told by your doctor.  If you were prescribed an antibiotic medicine, finish it all even if you start to feel better.  Rest the affected area as told by your doctor. ? Keep the area raised up. ? Avoid doing things that make the pain worse.  Apply ice to the injured area: ? Place ice in a plastic bag. ? Place a towel between your skin and the bag. ? Leave the ice on for 20 minutes, 2-3 times a day.  Use splints, braces, pads, or walking aids as told by your doctor.  Keep all follow-up visits as told by your doctor. This is important. Contact a doctor if:  You have more pain with home care.  You have a fever.  You have chills. This information is not intended to replace advice given to you by your health care provider. Make sure you discuss any questions you have with your health care provider. Document Released: 01/11/2010 Document Revised: 12/30/2015 Document Reviewed: 10/13/2013 Elsevier Interactive Patient Education  2018 [employer].

## 2018-02-22 ENCOUNTER — Encounter: Payer: Self-pay | Admitting: Family Medicine

## 2018-03-04 DIAGNOSIS — M7021 Olecranon bursitis, right elbow: Secondary | ICD-10-CM | POA: Diagnosis not present

## 2018-03-11 ENCOUNTER — Telehealth: Payer: Self-pay | Admitting: Internal Medicine

## 2018-03-11 DIAGNOSIS — I10 Essential (primary) hypertension: Secondary | ICD-10-CM

## 2018-03-11 NOTE — Telephone Encounter (Signed)
Copied from [place] [id number]. Topic: Quick Communication - See Telephone Encounter >> Mar 11, 2018  3:40 PM Mylinda Latina, NT wrote: CRM for notification. See Telephone encounter for: 03/11/18. Patient called and states the she tried to get a refill of her telmisartan (MICARDIS) 80 MG tablet . The pharmacy states it is too early, pt states that Dr. Derrel Nip changed the frequency to twice a day and to have a 90 day supply. Patient states she needs a new RX sent to the pharmacy with the current frequency on it . Please advise   [facility] [address] [place] [phone] (Phone) [phone] (Fax)

## 2018-03-12 MED ORDER — TELMISARTAN 80 MG PO TABS: 160.0000 mg | ORAL_TABLET | Freq: Every day | ORAL | 1 refills | Status: DC

## 2018-03-12 NOTE — Telephone Encounter (Signed)
Rx request This was last filled by another provider on 11-14-17 Patient was last seen on 01-15-18 and  Was told to increase this medication

## 2018-03-12 NOTE — Telephone Encounter (Signed)
Medication was refilled with increased dose to two tablets daily per Dr. Lupita Dawn last office note with pt. Pt is aware that medication has been refilled.

## 2018-03-14 NOTE — Telephone Encounter (Signed)
Patient states insurance states they dose is too high, will need PA. CB # [phone]

## 2018-03-14 NOTE — Telephone Encounter (Signed)
Prior auth required

## 2018-03-15 MED ORDER — TELMISARTAN 80 MG PO TABS: 80.0000 mg | ORAL_TABLET | Freq: Every day | ORAL | 1 refills | Status: DC

## 2018-03-15 NOTE — Addendum Note (Signed)
Addended by: Crecencio Mc on: 03/15/2018 10:20 AM   Modules accepted: Orders

## 2018-03-15 NOTE — Telephone Encounter (Signed)
Dose reduced to 80 m g daily and sent to [facility] on [address].  Ask patient to return for bmet and BP check one week

## 2018-03-15 NOTE — Telephone Encounter (Signed)
Insurance is stating that the two tablets daily of telmisartan 80mg  is too high and is requiring a PA. Pt's bp on 02/21/2018 was 108/62.

## 2018-03-15 NOTE — Telephone Encounter (Signed)
Spoke with pt to let her know that we are going to have to contact her insurance company to get override on the Telmisartan rx. Pt gave a verbal understanding.

## 2018-03-18 NOTE — Telephone Encounter (Signed)
Spoke with pt's husband and he stated that the pt was walking into [facility] to pick up her telmisartan that they were able to get filled.

## 2018-03-18 NOTE — Telephone Encounter (Signed)
Patient is returning [place] phone call.

## 2018-03-18 NOTE — Telephone Encounter (Signed)
Patient returned call

## 2018-03-18 NOTE — Telephone Encounter (Signed)
LMTCB. Please transfer pt to our office.  

## 2018-03-20 ENCOUNTER — Ambulatory Visit (INDEPENDENT_AMBULATORY_CARE_PROVIDER_SITE_OTHER): Payer: Medicare Other

## 2018-03-20 ENCOUNTER — Other Ambulatory Visit (INDEPENDENT_AMBULATORY_CARE_PROVIDER_SITE_OTHER): Payer: Medicare Other

## 2018-03-20 DIAGNOSIS — I10 Essential (primary) hypertension: Secondary | ICD-10-CM

## 2018-03-20 NOTE — Progress Notes (Addendum)
Patient came in today for 1 week BP check after decreasing Telmisartan from 80 mg BID to q day. Patient is tolerating well. BP was unable to be obtained in the right arm due to bursitis in her elbow. BP in left arm was 110/62. Advised patient to continue to monitor BP and if she sees that they start trending up, give the office a call. Pulse was 57 and O2 95%    I have reviewed the above information and agree with above.   Deborra Medina, MD

## 2018-03-21 LAB — BASIC METABOLIC PANEL
BUN: 28 mg/dL — AB (ref 6–23)
CHLORIDE: 104 meq/L (ref 96–112)
CO2: 25 mEq/L (ref 19–32)
CREATININE: 1.05 mg/dL (ref 0.40–1.20)
Calcium: 10.1 mg/dL (ref 8.4–10.5)
GFR: 54.72 mL/min — AB (ref >60.00)
Glucose, Bld: 140 mg/dL — ABNORMAL HIGH (ref 70–99)
POTASSIUM: 4 meq/L (ref 3.5–5.1)
Sodium: 138 mEq/L (ref 135–145)

## 2018-05-02 ENCOUNTER — Other Ambulatory Visit: Payer: Self-pay | Admitting: Internal Medicine

## 2018-05-02 NOTE — Telephone Encounter (Signed)
Looks like this has a different provider filling this medication.  Last OV: 02/21/2018 Next OV: not scheduled

## 2018-05-06 ENCOUNTER — Ambulatory Visit
Admission: RE | Admit: 2018-05-06 | Discharge: 2018-05-06 | Disposition: A | Discharge status: Home / Self Care | Payer: Medicare Other | Source: Ambulatory Visit | Attending: Internal Medicine | Admitting: Internal Medicine

## 2018-05-06 DIAGNOSIS — Z1231 Encounter for screening mammogram for malignant neoplasm of breast: Secondary | ICD-10-CM | POA: Insufficient documentation

## 2018-05-06 DIAGNOSIS — Z1239 Encounter for other screening for malignant neoplasm of breast: Secondary | ICD-10-CM

## 2018-05-08 ENCOUNTER — Telehealth: Payer: Self-pay

## 2018-05-08 NOTE — Telephone Encounter (Signed)
Copied from [place] [id number]. Topic: Medicare AWV >> May 08, 2018  4:17 PM Bea Graff, NT wrote: Reason for CRM: Pt calling to schedule her AWV. Please advise.

## 2018-05-09 NOTE — Telephone Encounter (Signed)
Pt has been scheduled for AWV

## 2018-05-10 ENCOUNTER — Ambulatory Visit (INDEPENDENT_AMBULATORY_CARE_PROVIDER_SITE_OTHER): Payer: Medicare Other

## 2018-05-10 VITALS — BP 138/68 | HR 58 | Temp 98.2°F | Resp 16 | Ht 66.0 in | Wt 186.0 lb

## 2018-05-10 DIAGNOSIS — Z0000 Encounter for general adult medical examination without abnormal findings: Secondary | ICD-10-CM

## 2018-05-10 DIAGNOSIS — Z23 Encounter for immunization: Secondary | ICD-10-CM | POA: Diagnosis not present

## 2018-05-10 NOTE — Progress Notes (Signed)
Subjective:   Katherine Hudson is a 72 y.o. female who presents for Medicare Annual (Subsequent) preventive examination.  Review of Systems:  No ROS.  Medicare Wellness Visit. Additional risk factors are reflected in the social history.  Cardiac Risk Factors include: advanced age (>43men, >13 women);hypertension     Objective:     Vitals: BP 138/68 (BP Location: Left Arm, Patient Position: Sitting, Cuff Size: Normal)   Pulse (!) 58   Temp 98.2 F (36.8 C) (Oral)   Resp 16   Ht 5\' 6"  (1.676 m)   Wt 186 lb (84.4 kg)   SpO2 94%   BMI 30.02 kg/m   Body mass index is 30.02 kg/m.  Advanced Directives 05/10/2018 06/08/2017 02/02/2017  Does Patient Have a Medical Advance Directive? Yes Yes Yes  Type of Paramedic of [place];Living will [place];Living will Living will;Healthcare Power of Attorney  Does patient want to make changes to medical advance directive? No - Patient declined No - Patient declined No - Patient declined  Copy of [place] in Chart? No - copy requested No - copy requested Yes    Tobacco Social History   Tobacco Use  Smoking Status Former Smoker  . Years: 10.00  Smokeless Tobacco Never Used  Tobacco Comment   quit 1980     Counseling given: Not Answered Comment: quit 1980   Clinical Intake:  Pre-visit preparation completed: Yes  Pain : 0-10     Nutritional Status: BMI > 30  Obese Diabetes: No  How often do you need to have someone help you when you read instructions, pamphlets, or other written materials from your doctor or pharmacy?: 1 - Never  Interpreter Needed?: No     Past Medical History:  Diagnosis Date  . Depression   . History of echocardiogram    a. 01/2017 Echo: EF 60-65%, no rwma, mild MR, mildly to mod dil LA. nl RV fxn.  . Hyperlipidemia   . Hypertension   . Left atrial flutter by electrocardiogram [facility])    a. demonstrated by Dr Lovena Le on EPS 6/18, not  ablated.  . Paroxysmal atrial fibrillation (HCC)    a. CHA2DS2VASc = 3-->Xarelto.  . Typical atrial flutter [facility])    s/p CTI ablation by Dr Lovena Le 6/18   Past Surgical History:  Procedure Laterality Date  . A-FLUTTER ABLATION N/A 02/02/2017   CTI ablation by Dr Lovena Le for typical atrial flutter.  Left atrial flutter also observed and cardioverted  . APPENDECTOMY    . KNEE SURGERY  2000   right  . ROTATOR CUFF REPAIR  1998   right   Family History  Problem Relation Age of Onset  . Cancer Father        lung  . Cancer Mother        cervical  . Stroke Sister    Social History   Socioeconomic History  . Marital status: Married    Spouse name: Not on file  . Number of children: Not on file  . Years of education: Not on file  . Highest education level: Not on file  Occupational History  . Not on file  Social Needs  . Financial resource strain: Not hard at all  . Food insecurity:    Worry: Never true    Inability: Never true  . Transportation needs:    Medical: No    Non-medical: No  Tobacco Use  . Smoking status: Former Smoker  Years: 10.00  . Smokeless tobacco: Never Used  . Tobacco comment: quit 1980  Substance and Sexual Activity  . Alcohol use: Yes    Comment: occassionally  . Drug use: No  . Sexual activity: Not on file  Lifestyle  . Physical activity:    Days per week: 0 days    Minutes per session: Not on file  . Stress: Not at all  Relationships  . Social connections:    Talks on phone: Not on file    Gets together: Not on file    Attends religious service: Not on file    Active member of club or organization: Not on file    Attends meetings of clubs or organizations: Not on file    Relationship status: Not on file  Other Topics Concern  . Not on file  Social History Narrative   Lives in [place]    Outpatient Encounter Medications as of 05/10/2018  Medication Sig  . cetirizine (ZYRTEC) 10 MG tablet Take 10 mg by mouth daily as needed for  allergies.  . Cholecalciferol (VITAMIN D3) 1000 units CAPS Take 1,000 Units by mouth daily.  Marland Kitchen CINNAMON PO Take 2,000 mg by mouth daily.  . Coenzyme Q10 (CO Q-10) 100 MG CAPS Take 100 mg by mouth daily.  . Cranberry 200 MG CAPS Take 200 mg by mouth 2 (two) times daily.   . diphenhydrAMINE (BENADRYL) 2 % cream Apply 1 application topically as needed (bug bites).  . Evolocumab (REPATHA SURECLICK) 409 MG/ML SOAJ Inject 140 mg into the skin every 14 (fourteen) days.  . flecainide (TAMBOCOR) 50 MG tablet Take 1 tablet (50 mg total) by mouth 2 (two) times daily.  . fluticasone (FLONASE) 50 MCG/ACT nasal spray Place 2 sprays at bedtime into both nostrils.  Javier Docker Oil 500 MG CAPS Take 500 mg by mouth daily.  . Melatonin 10 MG TABS Take 10 mg by mouth at bedtime as needed (sleep).  . Multiple Vitamin (MULTIVITAMIN) tablet Take 1 tablet by mouth daily.    . Naphazoline HCl (CLEAR EYES OP) Apply 1 drop to eye daily as needed (irritation).  Marland Kitchen omega-3 acid ethyl esters (LOVAZA) 1 g capsule Take 2 g daily by mouth.  . oxybutynin (OXYTROL) 3.9 MG/24HR Place 1 patch onto the skin See admin instructions. Every 4 days  . pramoxine (PROCTOFOAM) 1 % foam Place 1 application rectally 3 (three) times daily as needed for itching.  . Probiotic Product (PROBIOTIC PO) Take 1 tablet by mouth daily.  . Psyllium (METAMUCIL FIBER PO) Take by mouth.  . rivaroxaban (XARELTO) 20 MG TABS tablet Take 1 tablet (20 mg total) by mouth daily with supper.  . sertraline (ZOLOFT) 50 MG tablet TAKE 3 TABLETS BY MOUTH ONCE DAILY  . sodium chloride (OCEAN) 0.65 % SOLN nasal spray Place 1 spray into both nostrils as needed for congestion.  Marland Kitchen telmisartan (MICARDIS) 80 MG tablet Take 1 tablet (80 mg total) by mouth daily.  . TURMERIC PO Take 1 capsule by mouth daily.  . [DISCONTINUED] naproxen sodium (ALEVE) 220 MG tablet Take 220 mg daily as needed by mouth.  . [DISCONTINUED] sertraline (ZOLOFT) 50 MG tablet Take 100 mg by mouth 2 (two)  times daily.   Marland Kitchen diltiazem (CARDIZEM CD) 120 MG 24 hr capsule Take 1 capsule (120 mg total) by mouth daily.   No facility-administered encounter medications on file as of 05/10/2018.     Activities of Daily Living In your present state of health, do you have  any difficulty performing the following activities: 05/10/2018 06/08/2017  Hearing? N N  Vision? N N  Difficulty concentrating or making decisions? N N  Walking or climbing stairs? Y Y  Comment Difficulty walking long distances Left knee pain, intermittent  Dressing or bathing? N N  Doing errands, shopping? N N  Comment - Chooses not to drive most of the time  Preparing Food and eating ? N N  Using the Toilet? N N  In the past six months, have you accidently leaked urine? Y Y  Comment Managed with daiky brief Managed with a daily pad  Do you have problems with loss of bowel control? Tempie Donning  Comment Managed with daily brief Followed by PCP  Managing your Medications? N N  Managing your Finances? N N  Housekeeping or managing your Housekeeping? N N  Some recent data might be hidden    Patient Care Team: Crecencio Mc, MD as PCP - General (Internal Medicine) Deboraha Sprang, MD as PCP - Cardiology (Cardiology)    Assessment:   This is a routine wellness examination for [place].  The goal of the wellness visit is to assist the patient how to close the gaps in care and create a preventative care plan for the patient.   The roster of all physicians providing medical care to patient is listed in the Snapshot section of the chart.  Osteopenia. Taking calcium VIT D as appropriate/Osteoporosis risk reviewed.    Safety issues reviewed; Smoke and carbon monoxide detectors in the home. No firearms in the home. Wears seatbelts when driving or riding with others. No violence in the home.  They do not have excessive sun exposure.  Discussed the need for sun protection: hats, long sleeves and the use of sunscreen if there is significant sun  exposure.  No new identified risk were noted.  No failures at ADL's or IADL's.    BMI- discussed the importance of a healthy diet, water intake and the benefits of aerobic exercise. Educational material provided.   Dental- every 12 months.  Eye- Visual acuity not assessed per patient preference since they have regular follow up with the ophthalmologist.  Wears corrective lenses.  Sleep patterns- Sleeps without through the night.   High dose influenza vaccine administered L deltoid, tolerated well.No verbal complaint during of after administration. Educational material provided.  Health maintenance gaps- closed.  Patient Concerns: None at this time. Follow up with PCP as needed.  Exercise Activities and Dietary recommendations Current Exercise Habits: The patient does not participate in regular exercise at present  Goals      Patient Stated   . Increase physical activity (pt-stated)       Fall Risk Fall Risk  05/10/2018 06/08/2017 12/11/2016 09/20/2014 02/18/2014  Falls in the past year? No Yes Yes Yes Yes  Number falls in past yr: - 1 2 or more 1 1  Injury with Fall? - Yes Yes Yes Yes  Comment - She sought medical attention for hurt ribs - - -  Risk Factor Category  - High Fall Risk High Fall Risk - -  Risk for fall due to : - History of fall(s) - History of fall(s) -  Follow up - Education provided;Falls prevention discussed - Falls prevention discussed -   Depression Screen PHQ 2/9 Scores 05/10/2018 06/08/2017 12/11/2016 09/20/2014  PHQ - 2 Score 0 0 0 0  Exception Documentation - - Patient refusal -     Cognitive Function MMSE - Mini Mental State Exam  05/10/2018 06/08/2017  Orientation to time 5 5  Orientation to Place 5 5  Registration 3 3  Attention/ Calculation 5 5  Recall 3 3  Language- name 2 objects 2 2  Language- repeat 1 1  Language- follow 3 step command 3 3  Language- read & follow direction 1 1  Write a sentence 1 1  Copy design 1 1  Total score 30 30         Immunization History  Administered Date(s) Administered  . Influenza Split 05/30/2014  . Influenza Whole 07/13/2011  . Influenza, High Dose Seasonal PF 05/26/2015, 06/08/2017, 05/10/2018  . Influenza,inj,Quad PF,6+ Mos 06/02/2013  . Influenza-Unspecified 04/07/2012  . Pneumococcal Conjugate-13 06/02/2013  . Pneumococcal Polysaccharide-23 05/26/2015  . Tdap 12/17/2010  . Zoster 05/30/2014   Screening Tests Health Maintenance  Topic Date Due  . MAMMOGRAM  05/06/2020  . TETANUS/TDAP  12/16/2020  . COLONOSCOPY  07/19/2023  . INFLUENZA VACCINE  Completed  . DEXA SCAN  Completed  . Hepatitis C Screening  Completed  . PNA vac Low Risk Adult  Completed      Plan:   End of life planning; Advance aging; Advanced directives discussed. Copy of current HCPOA/Living Will requested.    I have personally reviewed and noted the following in the patient's chart:   . Medical and social history . Use of alcohol, tobacco or illicit drugs  . Current medications and supplements . Functional ability and status . Nutritional status . Physical activity . Advanced directives . List of other physicians . Hospitalizations, surgeries, and ER visits in previous 12 months . Vitals . Screenings to include cognitive, depression, and falls . Referrals and appointments  In addition, I have reviewed and discussed with patient certain preventive protocols, quality metrics, and best practice recommendations. A written personalized care plan for preventive services as well as general preventive health recommendations were provided to patient.     Varney Biles, LPN  56/09/1306

## 2018-05-10 NOTE — Patient Instructions (Addendum)
  Katherine Hudson , Thank you for taking time to come for your Medicare Wellness Visit. I appreciate your ongoing commitment to your health goals. Please review the following plan we discussed and let me know if I can assist you in the future.   Follow up as needed.    Bring a copy of your [place] and/or Living Will to be scanned into chart.  Have a great day!  These are the goals we discussed: Goals      Patient Stated   . Increase physical activity (pt-stated)       This is a list of the screening recommended for you and due dates:  Health Maintenance  Topic Date Due  . Mammogram  05/06/2020  . Tetanus Vaccine  12/16/2020  . Colon Cancer Screening  07/19/2023  . Flu Shot  Completed  . DEXA scan (bone density measurement)  Completed  .  Hepatitis C: One time screening is recommended by Center for Disease Control  (CDC) for  adults born from [birth year] through 1965.   Completed  . Pneumonia vaccines  Completed

## 2018-05-19 NOTE — Progress Notes (Signed)
  I have reviewed the above information and agree with above.   Minerva Bluett, MD 

## 2018-05-29 DIAGNOSIS — H2513 Age-related nuclear cataract, bilateral: Secondary | ICD-10-CM | POA: Diagnosis not present

## 2018-06-10 ENCOUNTER — Ambulatory Visit: Payer: Medicare Other

## 2018-06-13 ENCOUNTER — Other Ambulatory Visit: Payer: Self-pay | Admitting: Internal Medicine

## 2018-07-11 ENCOUNTER — Encounter: Payer: Self-pay | Admitting: Internal Medicine

## 2018-07-11 ENCOUNTER — Ambulatory Visit: Payer: Medicare Other | Admitting: Internal Medicine

## 2018-07-11 VITALS — BP 160/62 | HR 49 | Ht 66.0 in | Wt 184.0 lb

## 2018-07-11 DIAGNOSIS — I480 Paroxysmal atrial fibrillation: Secondary | ICD-10-CM | POA: Diagnosis not present

## 2018-07-11 DIAGNOSIS — R001 Bradycardia, unspecified: Secondary | ICD-10-CM

## 2018-07-11 DIAGNOSIS — I484 Atypical atrial flutter: Secondary | ICD-10-CM

## 2018-07-11 NOTE — Patient Instructions (Addendum)
Medication Instructions:  - Your physician has recommended you make the following change in your medication:   1) STOP cardizem (diltiazem)  If you need a refill on your cardiac medications before your next appointment, please call your pharmacy.   Lab work: - none ordered  If you have labs (blood work) drawn today and your tests are completely normal, you will receive your results only by: Marland Kitchen MyChart Message (if you have MyChart) OR . A paper copy in the mail If you have any lab test that is abnormal or we need to change your treatment, we will call you to review the results.  Testing/Procedures: - none ordered  Follow-Up: At [facility], you and your health needs are our priority.  As part of our continuing mission to provide you with exceptional heart care, we have created designated Provider Care Teams.  These Care Teams include your primary Cardiologist (physician) and Advanced Practice Providers (APPs -  Physician Assistants and Nurse Practitioners) who all work together to provide you with the care you need, when you need it. . in 2 weeks with Dr. Caryl Comes - Thursday 07/25/18 at 8:15 am  Any Other Special Instructions Will Be Listed Below (If Applicable). - N/A

## 2018-07-11 NOTE — Progress Notes (Signed)
Patient Care Team: Crecencio Mc, MD as PCP - General (Internal Medicine) Deboraha Sprang, MD as PCP - Cardiology (Cardiology)   HPI  Katherine Hudson is a 72 y.o. female Seen in follow-up for atrial flutter. She presented with an atypical flutter but without an antecedent history of cardiac instrumentation. It was presumed to be reverse typical flutter and 6/18 she underwent catheter ablation. Unfortunately, during the procedure she reverted spontaneously to a left atrial flutter. Flecainide therapy was initiated. However, on 2 occasions she has presented for flecainide assessment on treadmill testing and been found to be in atrial fibrillation.     DATE TEST    6/18    Echo   EF 60-65 % LAE (43/2.2/44) E/E' normal         Date Cr K Hgb  6/18 1.25  14.5   11/18 0.93 4.2 15.3  8/19 1.05 4.0    DATE PR interval QRSduration Dose-Flecainide  4/18  146 70 0  7/18 160 76 100 (qd)  6/19 172 72 50 (bid)   12/19 160 76 50          Because of symptoms of shortness of breath and dizziness attributed to her atrial arrhythmias, she saw Dr. Greggory Brandy  11/18 for consideration of ablation.  At that time, he noted that she was taking her flecainide once daily, she was having few symptoms, and she was not interested in pursuing ablation.  Since then she has been taking the flecainide 2/day and has had no recurrent palps    GXT-- peak HR  96 (64% predicted max)  Significant problems with exercise intolerance and just giving out.  At the last visit we had decreased her diltiazem from 180--120.  Heart rates are slower today (see below)  She continues to have problems with dizziness, this previously ascribed to her atrial arrhythmias now?  Related to her flecainide      Past Medical History:  Diagnosis Date  . Depression   . History of echocardiogram    a. 01/2017 Echo: EF 60-65%, no rwma, mild MR, mildly to mod dil LA. nl RV fxn.  . Hyperlipidemia   . Hypertension   . Left atrial  flutter by electrocardiogram [facility])    a. demonstrated by Dr Lovena Le on EPS 6/18, not ablated.  . Paroxysmal atrial fibrillation (HCC)    a. CHA2DS2VASc = 3-->Xarelto.  . Typical atrial flutter [facility])    s/p CTI ablation by Dr Lovena Le 6/18    Past Surgical History:  Procedure Laterality Date  . A-FLUTTER ABLATION N/A 02/02/2017   CTI ablation by Dr Lovena Le for typical atrial flutter.  Left atrial flutter also observed and cardioverted  . APPENDECTOMY    . KNEE SURGERY  2000   right  . ROTATOR CUFF REPAIR  1998   right    Current Outpatient Medications  Medication Sig Dispense Refill  . cetirizine (ZYRTEC) 10 MG tablet Take 10 mg by mouth daily as needed for allergies.    . Cholecalciferol (VITAMIN D3) 1000 units CAPS Take 1,000 Units by mouth daily.    Marland Kitchen CINNAMON PO Take 2,000 mg by mouth daily.    . Coenzyme Q10 (CO Q-10) 100 MG CAPS Take 100 mg by mouth daily.    . Cranberry 200 MG CAPS Take 200 mg by mouth 2 (two) times daily.     . diphenhydrAMINE (BENADRYL) 2 % cream Apply 1 application topically as needed (bug bites).    . flecainide (  TAMBOCOR) 50 MG tablet Take 1 tablet (50 mg total) by mouth 2 (two) times daily. 180 tablet 3  . fluticasone (FLONASE) 50 MCG/ACT nasal spray Place 2 sprays at bedtime into both nostrils.    Javier Docker Oil 500 MG CAPS Take 500 mg by mouth daily.    . Melatonin 10 MG TABS Take 10 mg by mouth at bedtime as needed (sleep).    . Multiple Vitamin (MULTIVITAMIN) tablet Take 1 tablet by mouth daily.      . Naphazoline HCl (CLEAR EYES OP) Apply 1 drop to eye daily as needed (irritation).    Marland Kitchen omega-3 acid ethyl esters (LOVAZA) 1 g capsule Take 2 g daily by mouth.    . oxybutynin (OXYTROL) 3.9 MG/24HR Place 1 patch onto the skin See admin instructions. Every 4 days    . pramoxine (PROCTOFOAM) 1 % foam Place 1 application rectally 3 (three) times daily as needed for itching. 15 g 0  . Probiotic Product (PROBIOTIC PO) Take 1 tablet by mouth daily.    . Psyllium  (METAMUCIL FIBER PO) Take by mouth.    . sertraline (ZOLOFT) 50 MG tablet TAKE 3 TABLETS BY MOUTH ONCE DAILY 270 tablet 0  . sodium chloride (OCEAN) 0.65 % SOLN nasal spray Place 1 spray into both nostrils as needed for congestion.    Marland Kitchen telmisartan (MICARDIS) 80 MG tablet Take 1 tablet (80 mg total) by mouth daily. 90 tablet 1  . TURMERIC PO Take 1 capsule by mouth daily.    Alveda Reasons 20 MG TABS tablet TAKE 1 TABLET BY MOUTH ONCE DAILY WITH  SUPPER 30 tablet 6  . diltiazem (CARDIZEM CD) 120 MG 24 hr capsule Take 1 capsule (120 mg total) by mouth daily. 90 capsule 3   No current facility-administered medications for this visit.     Allergies  Allergen Reactions  . Penicillins Other (See Comments)    As a child-unknown Has patient had a PCN reaction causing immediate rash, facial/tongue/throat swelling, SOB or lightheadedness with hypotension: Unknown Has patient had a PCN reaction causing severe rash involving mucus membranes or skin necrosis: Unknown Has patient had a PCN reaction that required hospitalization: No Has patient had a PCN reaction occurring within the last 10 years: No If all of the above answers are "NO", then may proceed with Cephalosporin use.  Marland Kitchen Percocet [Oxycodone-Acetaminophen] Other (See Comments)    Hallucinations   . Latex Rash and Other (See Comments)    Knee scabbed over after using bandage      Review of Systems negative except from HPI and PMH  Physical Exam BP (!) 160/62 (BP Location: Right Arm, Patient Position: Sitting, Cuff Size: Normal)   Pulse (!) 49   Ht 5\' 6"  (1.676 m)   Wt 184 lb (83.5 kg)   BMI 29.70 kg/m  Well developed and nourished in no acute distress HENT normal Neck supple with JVP-flat Clear Regular rate and rhythm, no murmurs or gallops Abd-soft with active BS No Clubbing cyanosis edema Skin-warm and dry A & Oriented  Grossly normal sensory and motor function   ECG demonstrates  Sinus 49 16/08/48  Assessment and   Plan  Atrial flutter-atypical and reverse typical  Atrial fibrillation/left atrial flutters  Dizziness improved  DOE  Hypotension/hypertension  Bradycardia ? iatrogenic chronotropic incompetence  Dizziness persists.  Maybe is related to the flecainide.  We will consider exclusion  Bradycardia may be contributing to her exercise intolerance.  It seems to be slowing down even though  we decrease diltiazem from 180--120.  Not withstanding the concerns about leaving her unprotected on a 1 cm we will undertake a two-week exclusion trial of the diltiazem.  Have reviewed with the family the potential for tachycardia  We also discussed the potential role of dofetilide as an antiarrhythmic without heart rate slowing potential.  Blood pressure is elevated today.  Earlier she said it was 120 at home.  Current medicines are reviewed at length with the patient today .  The patient does not  have concerns regarding medicines.

## 2018-07-25 ENCOUNTER — Encounter: Payer: Self-pay | Admitting: Internal Medicine

## 2018-07-25 ENCOUNTER — Ambulatory Visit: Payer: Medicare Other | Admitting: Internal Medicine

## 2018-07-25 VITALS — BP 127/69 | HR 53 | Ht 66.0 in | Wt 183.2 lb

## 2018-07-25 DIAGNOSIS — I480 Paroxysmal atrial fibrillation: Secondary | ICD-10-CM | POA: Diagnosis not present

## 2018-07-25 DIAGNOSIS — R001 Bradycardia, unspecified: Secondary | ICD-10-CM | POA: Diagnosis not present

## 2018-07-25 DIAGNOSIS — I484 Atypical atrial flutter: Secondary | ICD-10-CM

## 2018-07-25 MED ORDER — PROPAFENONE HCL 225 MG PO TABS: 225.0000 mg | ORAL_TABLET | Freq: Two times a day (BID) | ORAL | 6 refills | Status: DC

## 2018-07-25 NOTE — Patient Instructions (Signed)
Medication Instructions:  Your physician has recommended you make the following change in your medication:   1) STOP flecainide  2) START rhythmol (propafenone) 225 mg- take 1 tablet by mouth twice daily  If you need a refill on your cardiac medications before your next appointment, please call your pharmacy.   Lab work: - none ordered  If you have labs (blood work) drawn today and your tests are completely normal, you will receive your results only by: Marland Kitchen MyChart Message (if you have MyChart) OR . A paper copy in the mail If you have any lab test that is abnormal or we need to change your treatment, we will call you to review the results.  Testing/Procedures: - none ordered  Follow-Up: At [facility], you and your health needs are our priority.  As part of our continuing mission to provide you with exceptional heart care, we have created designated Provider Care Teams.  These Care Teams include your primary Cardiologist (physician) and Advanced Practice Providers (APPs -  Physician Assistants and Nurse Practitioners) who all work together to provide you with the care you need, when you need it. . 3 months with Dr. Caryl Comes  Any Other Special Instructions Will Be Listed Below (If Applicable). - n/a

## 2018-07-25 NOTE — Progress Notes (Signed)
Patient Care Team: Crecencio Mc, MD as PCP - General (Internal Medicine) Deboraha Sprang, MD as PCP - Cardiology (Cardiology)   HPI  Katherine Hudson is a 72 y.o. female Seen in follow-up for atrial flutter. She presented with an atypical flutter but without an antecedent history of cardiac instrumentation. It was presumed to be reverse typical flutter and 6/18 she underwent catheter ablation. Unfortunately, during the procedure she reverted spontaneously to a left atrial flutter. Flecainide therapy was initiated. However, on 2 occasions she has presented for flecainide assessment on treadmill testing and been found to be in atrial fibrillation.     DATE TEST    6/18    Echo   EF 60-65 % LAE (43/2.2/44) E/E' normal         Date Cr K Hgb  6/18 1.25  14.5   11/18 0.93 4.2 15.3  8/19 1.05 4.0    DATE PR interval QRSduration Dose-Flecainide  4/18  146 70 0  7/18 160 76 100 (qd)  6/19 172 72 50 (bid)   12/19 160 76 50          Because of symptoms of shortness of breath and dizziness attributed to her atrial arrhythmias, she saw Dr. Greggory Brandy  11/18 for consideration of ablation.  At that time, he noted that she was taking her flecainide once daily, she was having few symptoms, and she was not interested in pursuing ablation.  Since then she has been taking the flecainide 2/day and has had no recurrent palps    GXT-- peak HR  96 (64% predicted max)  Significant problems with exercise intolerance and just giving out.  At the last visit we had decreased her diltiazem from 180--120.  Heart rates are slower today (see below)  She is having much less dizziness and less fatigue off of diltiazem.  Does not want to go back on it.    Past Medical History:  Diagnosis Date  . Depression   . History of echocardiogram    a. 01/2017 Echo: EF 60-65%, no rwma, mild MR, mildly to mod dil LA. nl RV fxn.  . Hyperlipidemia   . Hypertension   . Left atrial flutter by electrocardiogram  [facility])    a. demonstrated by Dr Lovena Le on EPS 6/18, not ablated.  . Paroxysmal atrial fibrillation (HCC)    a. CHA2DS2VASc = 3-->Xarelto.  . Typical atrial flutter [facility])    s/p CTI ablation by Dr Lovena Le 6/18    Past Surgical History:  Procedure Laterality Date  . A-FLUTTER ABLATION N/A 02/02/2017   CTI ablation by Dr Lovena Le for typical atrial flutter.  Left atrial flutter also observed and cardioverted  . APPENDECTOMY    . KNEE SURGERY  2000   right  . ROTATOR CUFF REPAIR  1998   right    Current Outpatient Medications  Medication Sig Dispense Refill  . cetirizine (ZYRTEC) 10 MG tablet Take 10 mg by mouth daily as needed for allergies.    . Cholecalciferol (VITAMIN D3) 1000 units CAPS Take 1,000 Units by mouth daily.    Marland Kitchen CINNAMON PO Take 2,000 mg by mouth daily.    . Coenzyme Q10 (CO Q-10) 100 MG CAPS Take 100 mg by mouth daily.    . Cranberry 200 MG CAPS Take 200 mg by mouth 2 (two) times daily.     . diphenhydrAMINE (BENADRYL) 2 % cream Apply 1 application topically as needed (bug bites).    . flecainide (TAMBOCOR)  50 MG tablet Take 1 tablet (50 mg total) by mouth 2 (two) times daily. 180 tablet 3  . fluticasone (FLONASE) 50 MCG/ACT nasal spray Place 2 sprays at bedtime into both nostrils.    Javier Docker Oil 500 MG CAPS Take 500 mg by mouth daily.    . Melatonin 10 MG TABS Take 10 mg by mouth at bedtime as needed (sleep).    . Multiple Vitamin (MULTIVITAMIN) tablet Take 1 tablet by mouth daily.      . Naphazoline HCl (CLEAR EYES OP) Apply 1 drop to eye daily as needed (irritation).    Marland Kitchen omega-3 acid ethyl esters (LOVAZA) 1 g capsule Take 2 g daily by mouth.    . oxybutynin (OXYTROL) 3.9 MG/24HR Place 1 patch onto the skin See admin instructions. Every 4 days    . pramoxine (PROCTOFOAM) 1 % foam Place 1 application rectally 3 (three) times daily as needed for itching. 15 g 0  . Probiotic Product (PROBIOTIC PO) Take 1 tablet by mouth daily.    . Psyllium (METAMUCIL FIBER PO) Take by  mouth.    . sertraline (ZOLOFT) 50 MG tablet TAKE 3 TABLETS BY MOUTH ONCE DAILY 270 tablet 0  . sodium chloride (OCEAN) 0.65 % SOLN nasal spray Place 1 spray into both nostrils as needed for congestion.    Marland Kitchen telmisartan (MICARDIS) 80 MG tablet Take 1 tablet (80 mg total) by mouth daily. 90 tablet 1  . TURMERIC PO Take 1 capsule by mouth daily.    Alveda Reasons 20 MG TABS tablet TAKE 1 TABLET BY MOUTH ONCE DAILY WITH  SUPPER 30 tablet 6   No current facility-administered medications for this visit.     Allergies  Allergen Reactions  . Penicillins Other (See Comments)    As a child-unknown Has patient had a PCN reaction causing immediate rash, facial/tongue/throat swelling, SOB or lightheadedness with hypotension: Unknown Has patient had a PCN reaction causing severe rash involving mucus membranes or skin necrosis: Unknown Has patient had a PCN reaction that required hospitalization: No Has patient had a PCN reaction occurring within the last 10 years: No If all of the above answers are "NO", then may proceed with Cephalosporin use.  Marland Kitchen Percocet [Oxycodone-Acetaminophen] Other (See Comments)    Hallucinations   . Latex Rash and Other (See Comments)    Knee scabbed over after using bandage      Review of Systems negative except from HPI and PMH  Physical Exam BP 127/69 (BP Location: Left Arm, Patient Position: Sitting, Cuff Size: Normal)   Pulse (!) 53   Ht 5\' 6"  (1.676 m)   Wt 183 lb 4 oz (83.1 kg)   BMI 29.58 kg/m  Well developed and nourished in no acute distress HENT normal Neck supple with JVP-flat Clear Regular rate and rhythm, no murmurs or gallops Abd-soft with active BS No Clubbing cyanosis edema Skin-warm and dry A & Oriented  Grossly normal sensory and motor function   ECG demonstrates sinus at 53 Interval 17/08/47  Assessment and  Plan  Atrial flutter-atypical and reverse typical  Atrial fibrillation/left atrial flutters  Dizziness  improved  DOE  Hypotension/hypertension  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  Because of the need for adjunctive AV nodal blockade, 2 alternative approaches were reviewed.  The first was to try propafenone which can be used without adjunctive medication, the other is to try either verapamil or beta-blocker.  She would prefer the former.  We will start  her on 225 twice daily.  Reviewed side effects and proarrhythmia Worry a little bit about the impact on bradycardia.

## 2018-08-19 ENCOUNTER — Telehealth: Payer: Self-pay | Admitting: Internal Medicine

## 2018-08-19 NOTE — Telephone Encounter (Signed)
Pt c/o medication issue:  1. Name of Medication: propafenone 225 mg po BID  2. How are you currently taking this medication (dosage and times per day)? 225 mg po BID  3. Are you having a reaction (difficulty breathing--STAT)? Denies   4. What is your medication issue? Cold and feeling blah

## 2018-08-20 NOTE — Telephone Encounter (Addendum)
Late entry- I spoke with the patient ~ 10:00 am this morning. She reports feelings of fatigue/ "brain fog" on the propafenone. She states this feels similar to being on the flecainide/ diltiazem. Per her report, her BP has been 141-159/<80 on average and her HR does not go above 72 bpm, mostly HR's are 50-60's. The patient does have almost daily occurrences of dizziness/ lightheadedness.  She denies feelings of pre-syncope.   I advised the patient I will need to review with Dr. Caryl Comes for further recommendations.  The patient asked about just stopping propafenone altogether to see how she feels. I advised the patient that we had this same discussion when she wanted to stop flecainide, which she did, and the chances of recurrent atrial fibrillation. The patient did end up with recurrent atrial fibrillation. Per the patient, she wonders if just living with the a-fib is better than the side effects of the drug.  I reviewed with her that she did go see Dr. Rayann Heman for consultation for atrial fib ablation, but she opted not to do the ablation because there was not guaranteed success with this.  I advised the patient I will review the above with Dr. Caryl Comes and call her back later this afternoon. The patient is agreeable.

## 2018-08-20 NOTE — Telephone Encounter (Signed)
Heather, thank you so much. Number of things. 1-let us have her come in for an ECG.  Looking at her treadmill from a few months ago, her peak heart rate was only in the mid 90s.  At that juncture clearly chronotropic incompetence 2-we can certainly let her stop her drug and she can try living with her atrial fibrillation. 3-in the event that it is not tolerable, options with dofetilide or repeat consideration of an ablation realizing that none of our therapies is guaranteed.  I will be on my phone if there are any other questions

## 2018-08-20 NOTE — Telephone Encounter (Signed)
I called and advised the patient I was waiting on final recommendations from Dr. Caryl Comes and I will be in touch with her tomorrow. She was agreeable.

## 2018-08-21 NOTE — Telephone Encounter (Signed)
I spoke with the patient regarding Dr. Olin Pia recommendations.  She is agreeable with coming in to the office tomorrow morning for an EKG prior to stopping her propafenone.   I have advised her we will have Dr. Caryl Comes review this while she is here and then go from there.  The patient voices understanding.

## 2018-08-22 ENCOUNTER — Ambulatory Visit (INDEPENDENT_AMBULATORY_CARE_PROVIDER_SITE_OTHER): Payer: Medicare Other | Admitting: *Deleted

## 2018-08-22 VITALS — BP 135/75 | HR 55 | Ht 66.0 in | Wt 189.0 lb

## 2018-08-22 DIAGNOSIS — I480 Paroxysmal atrial fibrillation: Secondary | ICD-10-CM | POA: Diagnosis not present

## 2018-08-22 NOTE — Progress Notes (Signed)
1.) Reason for visit: EKG check   2.) Name of MD requesting visit: Dr Caryl Comes  3.) H&P: atrial fib, atrial flutter  ROS related to problem: Patient here per Dr Caryl Comes request to evaluate EKG. See last telephone note: "Heather, thank you so much. Number of things. 1-let us have her come in for an ECG.  Looking at her treadmill from a few months ago, her peak heart rate was only in the mid 90s.  At that juncture clearly chronotropic incompetence 2-we can certainly let her stop her drug and she can try living with her atrial fibrillation. 3-in the event that it is not tolerable, options with dofetilide or repeat consideration of an ablation realizing that none of our therapies is guaranteed. I will be on my phone if there are any other questions"  Patient would like to stop the propafenone due to feeling fatigued and "brain fog." Denies any other cardiac symptoms at this time. BP 135/75 today.  4.) Assessment and plan per MD: Discussed with Dr Caryl Comes who reviewed chart and EKG. Advised ok to stop propafenone and keep follow up as scheduled in March.   Patient verbalized understanding and will call us if any new questions or concerns arise.

## 2018-08-23 DIAGNOSIS — H40033 Anatomical narrow angle, bilateral: Secondary | ICD-10-CM | POA: Diagnosis not present

## 2018-08-23 DIAGNOSIS — H2513 Age-related nuclear cataract, bilateral: Secondary | ICD-10-CM | POA: Diagnosis not present

## 2018-08-23 NOTE — Addendum Note (Signed)
Addended by: Janan Ridge on: 08/23/2018 11:52 AM   Modules accepted: Orders

## 2018-09-09 ENCOUNTER — Other Ambulatory Visit: Payer: Self-pay | Admitting: Internal Medicine

## 2018-10-21 ENCOUNTER — Telehealth: Payer: Self-pay | Admitting: Internal Medicine

## 2018-10-21 NOTE — Telephone Encounter (Signed)
Called patient to OV scheduled for thrusday   Got VM and will try again later

## 2018-10-23 NOTE — Telephone Encounter (Signed)
Attempted to call the patient regarding her appointment that is scheduled with Dr. Caryl Comes for 10/24/18. I left a message on her voice mail stating we are needing to assess how she is currently doing from a cardiac perspective and for COVID-19 screening. I asked that she call the office back.

## 2018-10-23 NOTE — Telephone Encounter (Signed)
I spoke with the patient.      Cardiac Questionnaire:    Since your last visit or hospitalization:    1. Have you been having new or worsening chest pain? No    2. Have you been having new or worsening shortness of breath? No 3. Have you been having new or worsening leg swelling, wt gain, or increase in abdominal girth (pants fitting more tightly)? No   4. Have you had any passing out spells? No  The patient feels her heart rhythm is very stable at this time.  She is in favor of rescheduling her follow up appointment with Dr. Caryl Comes.  I have rescheduled her to 12/05/18 at 9:15 am.

## 2018-10-24 ENCOUNTER — Ambulatory Visit: Payer: Medicare Other | Admitting: Internal Medicine

## 2018-11-19 ENCOUNTER — Telehealth: Payer: Self-pay

## 2018-11-19 NOTE — Telephone Encounter (Signed)
Virtual Visit Pre-Appointment Phone Call    Confirm consent - "In the setting of the current Covid19 crisis, you are scheduled for a VIDEO visit with your provider on 12/05/2018 at 9:00am.  Just as we do with many in-office visits, in order for you to participate in this visit, we must obtain consent.  I can obtain your verbal consent now.  All virtual visits are billed to your insurance company just like a normal visit would be.  By agreeing to a virtual visit, we'd like you to understand that the technology does not allow for your provider to perform an examination, and thus may limit your provider's ability to fully assess your condition.  Finally, though the technology is pretty good, we cannot assure that it will always work on either your or our end, and in the setting of a video visit, we may have to convert it to a phone-only visit.  In either situation, we cannot ensure that we have a secure connection.  Are you willing to proceed? YES     TELEPHONE CALL NOTE  Katherine Hudson has been deemed a candidate for a follow-up tele-health visit to limit community exposure during the Covid-19 pandemic. I spoke with the patient via phone to ensure availability of phone/video source, confirm preferred email & phone number, and discuss instructions and expectations.  I reminded Katherine Hudson to be prepared with any vital sign and/or heart rhythm information that could potentially be obtained via home monitoring, at the time of her visit. I reminded Katherine Hudson to expect a phone call at the time of her visit if her visit.  Alba Destine, RMA 11/19/2018 4:20 PM   IF USING DOXIMITY or DOXY.ME - The patient will receive a link just prior to their visit, either by text or email (to be determined day of appointment depending on if it's doxy.me or Doximity).     FULL LENGTH CONSENT FOR TELE-HEALTH VISIT   I hereby voluntarily request, consent and authorize [facility] and its  employed or contracted physicians, physician assistants, nurse practitioners or other licensed health care professionals (the Practitioner), to provide me with telemedicine health care services (the "Services") as deemed necessary by the treating Practitioner. I acknowledge and consent to receive the Services by the Practitioner via telemedicine. I understand that the telemedicine visit will involve communicating with the Practitioner through live audiovisual communication technology and the disclosure of certain medical information by electronic transmission. I acknowledge that I have been given the opportunity to request an in-person assessment or other available alternative prior to the telemedicine visit and am voluntarily participating in the telemedicine visit.  I understand that I have the right to withhold or withdraw my consent to the use of telemedicine in the course of my care at any time, without affecting my right to future care or treatment, and that the Practitioner or I may terminate the telemedicine visit at any time. I understand that I have the right to inspect all information obtained and/or recorded in the course of the telemedicine visit and may receive copies of available information for a reasonable fee.  I understand that some of the potential risks of receiving the Services via telemedicine include:  Marland Kitchen Delay or interruption in medical evaluation due to technological equipment failure or disruption; . Information transmitted may not be sufficient (e.g. poor resolution of images) to allow for appropriate medical decision making by the Practitioner; and/or  . In rare instances, security protocols could  fail, causing a breach of personal health information.  Furthermore, I acknowledge that it is my responsibility to provide information about my medical history, conditions and care that is complete and accurate to the best of my ability. I acknowledge that Practitioner's advice,  recommendations, and/or decision may be based on factors not within their control, such as incomplete or inaccurate data provided by me or distortions of diagnostic images or specimens that may result from electronic transmissions. I understand that the practice of medicine is not an exact science and that Practitioner makes no warranties or guarantees regarding treatment outcomes. I acknowledge that I will receive a copy of this consent concurrently upon execution via email to the email address I last provided but may also request a printed copy by calling the office of [place].    I understand that my insurance will be billed for this visit.   I have read or had this consent read to me. . I understand the contents of this consent, which adequately explains the benefits and risks of the Services being provided via telemedicine.  . I have been provided ample opportunity to ask questions regarding this consent and the Services and have had my questions answered to my satisfaction. . I give my informed consent for the services to be provided through the use of telemedicine in my medical care  By participating in this telemedicine visit I agree to the above.

## 2018-12-05 ENCOUNTER — Encounter: Payer: Medicare Other | Admitting: Internal Medicine

## 2018-12-05 ENCOUNTER — Other Ambulatory Visit: Payer: Self-pay

## 2018-12-05 MED ORDER — SERTRALINE HCL 50 MG PO TABS: 150.0000 mg | ORAL_TABLET | Freq: Every day | ORAL | 1 refills | Status: DC

## 2018-12-05 MED ORDER — RIVAROXABAN 20 MG PO TABS: ORAL_TABLET | ORAL | 1 refills | Status: DC

## 2018-12-05 MED ORDER — TELMISARTAN 80 MG PO TABS: 80.0000 mg | ORAL_TABLET | Freq: Every day | ORAL | 1 refills | Status: DC

## 2018-12-05 NOTE — Progress Notes (Signed)
Electrophysiology TeleHealth Note   Due to national recommendations of social distancing due to COVID 19, an audio/video telehealth visit is felt to be most appropriate for this patient at this time.  See MyChart message from today for the patient's consent to telehealth for [facility].   Date:  12/05/2018   ID:  Katherine Hudson, DOB 05/23/1946, MRN [id number]  Location: patient's home  Provider location: [address] [place]  Evaluation Performed: Follow-up visit  PCP:  Crecencio Mc, MD  Cardiologist:   TG Electrophysiologist:  SK   Chief Complaint: aflutter  History of Present Illness:    Katherine Hudson is a 73 y.o. female who presents via audio/video conferencing for a telehealth visit today. The patient did not have access to video technology/had technical difficulties with video requiring transitioning to audio format only (telephone).  All issues noted in this document were discussed and addressed.  No physical exam could be performed with this format.     Since last being seen in our clinic for atrial flutter  the patient reports doing well *   DATE TEST    6/18    Echo   EF 60-65 % LAE (43/2.2/44) E/E' normal         Date Cr K Hgb  6/18 1.25  14.5   11/18 0.93 4.2 15.3  8/19 1.05 4.0         At last visit we changed flecaindie >> propafenone and then stopped it altogether  The patient denies chest pain , shortness of breath , nocturnal dyspnea  Orthopnea  or peripheral edema .  There have been no palpitations *, lightheadedness  or syncope    On Anticoagulation;  No bleeding issues   The patient denies symptoms of fevers, chills, cough, or new SOB worrisome for COVID 19.    Past Medical History:  Diagnosis Date  . Depression   . History of echocardiogram    a. 01/2017 Echo: EF 60-65%, no rwma, mild MR, mildly to mod dil LA. nl RV fxn.  . Hyperlipidemia   . Hypertension   . Left atrial flutter by electrocardiogram [facility])     a. demonstrated by Dr Lovena Le on EPS 6/18, not ablated.  . Paroxysmal atrial fibrillation (HCC)    a. CHA2DS2VASc = 3-->Xarelto.  . Typical atrial flutter [facility])    s/p CTI ablation by Dr Lovena Le 6/18    Past Surgical History:  Procedure Laterality Date  . A-FLUTTER ABLATION N/A 02/02/2017   CTI ablation by Dr Lovena Le for typical atrial flutter.  Left atrial flutter also observed and cardioverted  . APPENDECTOMY    . KNEE SURGERY  2000   right  . ROTATOR CUFF REPAIR  1998   right    Current Outpatient Medications  Medication Sig Dispense Refill  . cetirizine (ZYRTEC) 10 MG tablet Take 10 mg by mouth daily as needed for allergies.    . Cholecalciferol (VITAMIN D3) 1000 units CAPS Take 1,000 Units by mouth daily.    Marland Kitchen CINNAMON PO Take 2,000 mg by mouth daily.    . Coenzyme Q10 (CO Q-10) 100 MG CAPS Take 100 mg by mouth daily.    . Cranberry 200 MG CAPS Take 200 mg by mouth 2 (two) times daily.     . diphenhydrAMINE (BENADRYL) 2 % cream Apply 1 application topically as needed (bug bites).    . fluticasone (FLONASE) 50 MCG/ACT nasal spray Place 2 sprays at bedtime into both nostrils.    Marland Kitchen  Krill Oil 500 MG CAPS Take 500 mg by mouth daily.    . Melatonin 10 MG TABS Take 10 mg by mouth at bedtime as needed (sleep).    . Multiple Vitamin (MULTIVITAMIN) tablet Take 1 tablet by mouth daily.      . Naphazoline HCl (CLEAR EYES OP) Apply 1 drop to eye daily as needed (irritation).    Marland Kitchen omega-3 acid ethyl esters (LOVAZA) 1 g capsule Take 2 g daily by mouth.    . oxybutynin (OXYTROL) 3.9 MG/24HR Place 1 patch onto the skin See admin instructions. Every 4 days    . pramoxine (PROCTOFOAM) 1 % foam Place 1 application rectally 3 (three) times daily as needed for itching. 15 g 0  . Probiotic Product (PROBIOTIC PO) Take 1 tablet by mouth daily.    . Psyllium (METAMUCIL FIBER PO) Take by mouth.    . sodium chloride (OCEAN) 0.65 % SOLN nasal spray Place 1 spray into both nostrils as needed for congestion.     . TURMERIC PO Take 1 capsule by mouth daily.    . rivaroxaban (XARELTO) 20 MG TABS tablet TAKE 1 TABLET BY MOUTH ONCE DAILY WITH  SUPPER 90 tablet 1  . sertraline (ZOLOFT) 50 MG tablet Take 3 tablets (150 mg total) by mouth daily. 270 tablet 1  . telmisartan (MICARDIS) 80 MG tablet Take 1 tablet (80 mg total) by mouth daily. 90 tablet 1   No current facility-administered medications for this visit.     Allergies:   Penicillins; Percocet [oxycodone-acetaminophen]; and Latex   Social History:  The patient  reports that she has quit smoking. She quit after 10.00 years of use. She has never used smokeless tobacco. She reports current alcohol use. She reports that she does not use drugs.   Family History:  The patient's   family history includes Cancer in her father and mother; Stroke in her sister.   ROS:  Please see the history of present illness.   All other systems are personally reviewed and negative.    Exam:    Vital Signs:  BP 122/66 (BP Location: Left Arm, Patient Position: Sitting, Cuff Size: Normal)   Pulse (!) 52   Wt 182 lb (82.6 kg)   BMI 29.38 kg/m        Labs/Other Tests and Data Reviewed:    Recent Labs: 01/15/2018: ALT 15; TSH 1.90 03/20/2018: BUN 28; Creatinine, Ser 1.05; Potassium 4.0; Sodium 138   Wt Readings from Last 3 Encounters:  12/05/18 182 lb (82.6 kg)  08/22/18 189 lb (85.7 kg)  07/25/18 183 lb 4 oz (83.1 kg)     Other studies personally reviewed: Additional studies/ records that were reviewed today include:  Labs As above  No interval CBC    *    ASSESSMENT & PLAN:     Atrial flutter-atypical and reverse typical  Atrial fibrillation/left atrial flutters  Dizziness improved  Hypotension/hypertension  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  The importance of social distancing was discussed today.  Follow-up:  12 m     Current medicines are reviewed at length with the patient today.   The patient does not  have concerns regarding her medicines.  The following changes were made today:  none  Labs/ tests ordered today include  No orders of the defined types were placed in this encounter.   Future tests ( post COVID )  CBC* in 4 weeks  Patient Risk:  after full review of this patients clinical status, I feel that they are at moderate  risk at this time.  Today, I have spent  11  minutes with the patient with telehealth technology discussing the above.  Signed, Virl Axe, MD  12/05/2018 9:46 AM     [facility] [address] [place] [address] [phone] (office) [phone] (fax)  This encounter was created in error - please disregard.

## 2018-12-09 ENCOUNTER — Other Ambulatory Visit: Payer: Self-pay

## 2018-12-09 ENCOUNTER — Telehealth: Payer: Medicare Other | Admitting: Internal Medicine

## 2018-12-18 ENCOUNTER — Telehealth: Payer: Self-pay | Admitting: *Deleted

## 2018-12-18 DIAGNOSIS — G2581 Restless legs syndrome: Secondary | ICD-10-CM

## 2018-12-18 DIAGNOSIS — E034 Atrophy of thyroid (acquired): Secondary | ICD-10-CM

## 2018-12-18 DIAGNOSIS — E559 Vitamin D deficiency, unspecified: Secondary | ICD-10-CM

## 2018-12-18 DIAGNOSIS — D582 Other hemoglobinopathies: Secondary | ICD-10-CM

## 2018-12-18 DIAGNOSIS — E782 Mixed hyperlipidemia: Secondary | ICD-10-CM

## 2018-12-18 DIAGNOSIS — R7303 Prediabetes: Secondary | ICD-10-CM

## 2018-12-18 NOTE — Telephone Encounter (Signed)
Copied from [facility] [id number]. Topic: General - Inquiry >> Dec 18, 2018 12:17 PM Virl Axe D wrote: Reason for CRM: Pt stated her cardiologist recommended she have a full workup of labs. Requesting orders from Dr. Derrel Nip. Please advise.

## 2018-12-19 NOTE — Telephone Encounter (Signed)
Pt is scheduled for both a lab and a virtual visit with Dr. Derrel Nip. Pt is aware of both appts dates and times.

## 2018-12-19 NOTE — Telephone Encounter (Signed)
Which labs would you like to order?

## 2018-12-19 NOTE — Telephone Encounter (Signed)
Fasting labs ordered.  She will need to schedule an appointment as well ,  To discuss the labs since she has not been seen in over 6 months.

## 2018-12-20 ENCOUNTER — Other Ambulatory Visit (INDEPENDENT_AMBULATORY_CARE_PROVIDER_SITE_OTHER): Payer: Medicare Other

## 2018-12-20 ENCOUNTER — Other Ambulatory Visit: Payer: Self-pay

## 2018-12-20 DIAGNOSIS — R7303 Prediabetes: Secondary | ICD-10-CM | POA: Diagnosis not present

## 2018-12-20 DIAGNOSIS — G2581 Restless legs syndrome: Secondary | ICD-10-CM | POA: Diagnosis not present

## 2018-12-20 DIAGNOSIS — E034 Atrophy of thyroid (acquired): Secondary | ICD-10-CM | POA: Diagnosis not present

## 2018-12-20 DIAGNOSIS — D582 Other hemoglobinopathies: Secondary | ICD-10-CM

## 2018-12-20 DIAGNOSIS — E782 Mixed hyperlipidemia: Secondary | ICD-10-CM | POA: Diagnosis not present

## 2018-12-20 DIAGNOSIS — E559 Vitamin D deficiency, unspecified: Secondary | ICD-10-CM

## 2018-12-20 LAB — MICROALBUMIN / CREATININE URINE RATIO
Creatinine,U: 184.3 mg/dL
Microalb Creat Ratio: 0.8 mg/g (ref 0.0–30.0)
Microalb, Ur: 1.4 mg/dL (ref 0.0–1.9)

## 2018-12-20 LAB — CBC WITH DIFFERENTIAL/PLATELET
Basophils Absolute: 0.1 10*3/uL (ref 0.0–0.1)
Basophils Relative: 1.1 % (ref 0.0–3.0)
Eosinophils Absolute: 0.2 10*3/uL (ref 0.0–0.7)
Eosinophils Relative: 3.4 % (ref 0.0–5.0)
HCT: 44 % (ref 36.0–46.0)
Hemoglobin: 15.2 g/dL — ABNORMAL HIGH (ref 12.0–15.0)
Lymphocytes Relative: 29.2 % (ref 12.0–46.0)
Lymphs Abs: 1.8 10*3/uL (ref 0.7–4.0)
MCHC: 34.4 g/dL (ref 30.0–36.0)
MCV: 82.5 fl (ref 78.0–100.0)
Monocytes Absolute: 0.4 10*3/uL (ref 0.1–1.0)
Monocytes Relative: 5.9 % (ref 3.0–12.0)
Neutro Abs: 3.6 10*3/uL (ref 1.4–7.7)
Neutrophils Relative %: 60.4 % (ref 43.0–77.0)
Platelets: 171 10*3/uL (ref 150.0–400.0)
RBC: 5.34 Mil/uL — ABNORMAL HIGH (ref 3.87–5.11)
RDW: 14.4 % (ref 11.5–15.5)
WBC: 6 10*3/uL (ref 4.0–10.5)

## 2018-12-20 LAB — LIPID PANEL
Cholesterol: 247 mg/dL — ABNORMAL HIGH (ref 0–200)
HDL: 34 mg/dL — ABNORMAL LOW (ref >39.00)
LDL Cholesterol: 177 mg/dL — ABNORMAL HIGH (ref 0–99)
NonHDL: 212.66
Total CHOL/HDL Ratio: 7
Triglycerides: 179 mg/dL — ABNORMAL HIGH (ref 0.0–149.0)
VLDL: 35.8 mg/dL (ref 0.0–40.0)

## 2018-12-20 LAB — TSH: TSH: 2.59 u[IU]/mL (ref 0.35–4.50)

## 2018-12-20 LAB — COMPREHENSIVE METABOLIC PANEL
ALT: 28 U/L (ref 0–35)
AST: 25 U/L (ref 0–37)
Albumin: 4.3 g/dL (ref 3.5–5.2)
Alkaline Phosphatase: 54 U/L (ref 39–117)
BUN: 21 mg/dL (ref 6–23)
CO2: 24 mEq/L (ref 19–32)
Calcium: 9.1 mg/dL (ref 8.4–10.5)
Chloride: 108 mEq/L (ref 96–112)
Creatinine, Ser: 0.83 mg/dL (ref 0.40–1.20)
GFR: 67.39 mL/min (ref >60.00)
Glucose, Bld: 101 mg/dL — ABNORMAL HIGH (ref 70–99)
Potassium: 4.3 mEq/L (ref 3.5–5.1)
Sodium: 143 mEq/L (ref 135–145)
Total Bilirubin: 0.9 mg/dL (ref 0.2–1.2)
Total Protein: 6.6 g/dL (ref 6.0–8.3)

## 2018-12-20 LAB — HEMOGLOBIN A1C: Hgb A1c MFr Bld: 5.9 % (ref 4.6–6.5)

## 2018-12-20 LAB — VITAMIN D 25 HYDROXY (VIT D DEFICIENCY, FRACTURES): VITD: 44.59 ng/mL (ref 30.00–100.00)

## 2018-12-21 LAB — IRON,TIBC AND FERRITIN PANEL
%SAT: 26 % (calc) (ref 16–45)
Ferritin: 534 ng/mL — ABNORMAL HIGH (ref 16–288)
Iron: 79 ug/dL (ref 45–160)
TIBC: 307 mcg/dL (calc) (ref 250–450)

## 2018-12-23 ENCOUNTER — Other Ambulatory Visit: Payer: Self-pay

## 2018-12-23 ENCOUNTER — Encounter: Payer: Self-pay | Admitting: Internal Medicine

## 2018-12-23 ENCOUNTER — Ambulatory Visit (INDEPENDENT_AMBULATORY_CARE_PROVIDER_SITE_OTHER): Payer: Medicare Other | Admitting: Internal Medicine

## 2018-12-23 DIAGNOSIS — R7303 Prediabetes: Secondary | ICD-10-CM | POA: Diagnosis not present

## 2018-12-23 DIAGNOSIS — K582 Mixed irritable bowel syndrome: Secondary | ICD-10-CM

## 2018-12-23 DIAGNOSIS — S30860A Insect bite (nonvenomous) of lower back and pelvis, initial encounter: Secondary | ICD-10-CM

## 2018-12-23 DIAGNOSIS — E034 Atrophy of thyroid (acquired): Secondary | ICD-10-CM | POA: Diagnosis not present

## 2018-12-23 DIAGNOSIS — F324 Major depressive disorder, single episode, in partial remission: Secondary | ICD-10-CM

## 2018-12-23 DIAGNOSIS — G2581 Restless legs syndrome: Secondary | ICD-10-CM

## 2018-12-23 DIAGNOSIS — F5104 Psychophysiologic insomnia: Secondary | ICD-10-CM

## 2018-12-23 DIAGNOSIS — W57XXXA Bitten or stung by nonvenomous insect and other nonvenomous arthropods, initial encounter: Secondary | ICD-10-CM

## 2018-12-23 DIAGNOSIS — E782 Mixed hyperlipidemia: Secondary | ICD-10-CM

## 2018-12-23 MED ORDER — MIRABEGRON ER 50 MG PO TB24: 50.0000 mg | ORAL_TABLET | Freq: Every day | ORAL | 5 refills | Status: DC

## 2018-12-23 MED ORDER — DOXYCYCLINE HYCLATE 100 MG PO TABS: 100.0000 mg | ORAL_TABLET | Freq: Two times a day (BID) | ORAL | 0 refills | Status: DC

## 2018-12-23 NOTE — Patient Instructions (Signed)
Take doxycycline twice DAILY FOR THE TICK BITE TAKE WITH FOOD.   STOP THE OXYBUTYNIN PATCH AND TRY THE MYRBETRIQ for overactive bladder  NO I PHONE OR IPAD 2 HOURS BEFORE BEDTIME   GET BACK ON A FIBER SUPPLEMENT.  TRY THE  CITRUCEL   YOUR CHOLESTEROL IS STILL ELEVATED.  IF YOUR RISK OF HEART ATTACK IS > 15%  ,  I WILL RECOMMEND SOME FORM OF THERAPY TO LOWER IT

## 2018-12-23 NOTE — Progress Notes (Signed)
Virtual Visit converted to telephone  Note  This visit type was conducted due to national recommendations for restrictions regarding the COVID-19 pandemic (e.g. social distancing).  This format is felt to be most appropriate for this patient at this time.  All issues noted in this document were discussed and addressed.  No physical exam was performed (except for noted visual exam findings with Video Visits).   I connected with@ on 12/23/18 at  3:30 PM EDT by telephone .  Interactive audio and video telecommunications were attempted between this provider and patient, however failed, due to patient having technical difficulties .  We continued and completed visit with audio only.  I verified that I am speaking with the correct person using two identifiers. Location patient: home Location provider: work or home office Persons participating in the virtual visit: patient, provider  I discussed the limitations, risks, security and privacy concerns of performing an evaluation and management service by telephone and the availability of in person appointments. I also discussed with the patient that there may be a patient responsible charge related to this service. The patient expressed understanding and agreed to proceed.  Reason for visit:  follow upon multiple issues.  Last seen January 15 2018.   1) multiple tick bites: received from garden work despite wearing socks,  Long sleeves,  Etc.  Has one bite that has been red and scabbed over for nearly two weeks.  Denies fevers,  Some intermittent headaches.  2) atrial flutter. Managed by Caryl Comes. Did not tolerate flecainide.  started on propafenone 225 mg bid in Dec . Stopped it,  not taking anything currently.  . Did not tolerate diltiazem due to bradycardia and hypotension ..monitors pulse twice daily at home and pulse is < 80 and > 49.  Can't tell if it is regular  Or not.  No syncopal episodes,  But gets nauseated when pulse is  Low.  Feels better and is less  out of breath since she stopped all of the anti arrhythmics.  Taking Xarelto consistenlty.   3)chronic diarrhea:  Now alternating between constipation and pasty stools.  Last GI evaluation was in 2018 and fiber supplementation for IBS advised  . Not using currently on a regular basis, has only tried metamucil   4) insomnia:  Using max doses of melatonin.  Has nocturia despite oxybutynin patch.  Uses I phone before bed. No afternoon caffeine. Restless legs not bothersome at this point.  Denies symptoms of untreated depression and anxiety, taking sertraline   5) HYPERLIPIDEMIA:  HISTORY OF simvastatin intolerance with prior simvastatin trial.  Incomplete treadmill /myview due to fatigue.  No imaging studies available to assess risk for events.  Taking xarelto for embolic stroke risk modification.  10 yr risk using FRC is 20%   ROS: Patient denies headache, fevers, malaise, unintentional weight loss, skin rash, eye pain, sinus congestion and sinus pain, sore throat, dysphagia,  hemoptysis , cough, dyspnea, wheezing, chest pain, palpitations, orthopnea, edema, abdominal pain, nausea, melena,  flank pain, dysuria, hematuria,  numbness, tingling, seizures,  Focal weakness, Loss of consciousness,  Tremor,  Untreated symptoms of  depression, anxiety, and suicidal ideation.      Past Medical History:  Diagnosis Date  . Depression   . History of echocardiogram    a. 01/2017 Echo: EF 60-65%, no rwma, mild MR, mildly to mod dil LA. nl RV fxn.  . Hyperlipidemia   . Hypertension   . Left atrial flutter by electrocardiogram ([place])    a.  demonstrated by Dr Lovena Le on EPS 6/18, not ablated.  . Paroxysmal atrial fibrillation (HCC)    a. CHA2DS2VASc = 3-->Xarelto.  . Typical atrial flutter [facility])    s/p CTI ablation by Dr Lovena Le 6/18    Past Surgical History:  Procedure Laterality Date  . A-FLUTTER ABLATION N/A 02/02/2017   CTI ablation by Dr Lovena Le for typical atrial flutter.  Left atrial flutter also  observed and cardioverted  . APPENDECTOMY    . KNEE SURGERY  2000   right  . ROTATOR CUFF REPAIR  1998   right    Family History  Problem Relation Age of Onset  . Cancer Father        lung  . Cancer Mother        cervical  . Stroke Sister     SOCIAL HX: . Social History   Tobacco Use  . Smoking status: Former Smoker    Years: 10.00  . Smokeless tobacco: Never Used  . Tobacco comment: quit 1980  Substance Use Topics  . Alcohol use: Yes    Comment: occassionally  . Drug use: No     Current Outpatient Medications:  .  cetirizine (ZYRTEC) 10 MG tablet, Take 10 mg by mouth daily as needed for allergies., Disp: , Rfl:  .  Cholecalciferol (VITAMIN D3) 1000 units CAPS, Take 1,000 Units by mouth daily., Disp: , Rfl:  .  CINNAMON PO, Take 2,000 mg by mouth daily., Disp: , Rfl:  .  Coenzyme Q10 (CO Q-10) 100 MG CAPS, Take 100 mg by mouth daily., Disp: , Rfl:  .  Cranberry 200 MG CAPS, Take 200 mg by mouth 2 (two) times daily. , Disp: , Rfl:  .  diphenhydrAMINE (BENADRYL) 2 % cream, Apply 1 application topically as needed (bug bites)., Disp: , Rfl:  .  fluticasone (FLONASE) 50 MCG/ACT nasal spray, Place 2 sprays at bedtime into both nostrils., Disp: , Rfl:  .  Krill Oil 500 MG CAPS, Take 500 mg by mouth daily., Disp: , Rfl:  .  Melatonin 10 MG TABS, Take 10 mg by mouth at bedtime as needed (sleep)., Disp: , Rfl:  .  Multiple Vitamin (MULTIVITAMIN) tablet, Take 1 tablet by mouth daily.  , Disp: , Rfl:  .  Naphazoline HCl (CLEAR EYES OP), Apply 1 drop to eye daily as needed (irritation)., Disp: , Rfl:  .  pramoxine (PROCTOFOAM) 1 % foam, Place 1 application rectally 3 (three) times daily as needed for itching., Disp: 15 g, Rfl: 0 .  Probiotic Product (PROBIOTIC PO), Take 1 tablet by mouth daily., Disp: , Rfl:  .  Psyllium (METAMUCIL FIBER PO), Take by mouth., Disp: , Rfl:  .  rivaroxaban (XARELTO) 20 MG TABS tablet, TAKE 1 TABLET BY MOUTH ONCE DAILY WITH  SUPPER, Disp: 90 tablet,  Rfl: 1 .  sertraline (ZOLOFT) 50 MG tablet, Take 3 tablets (150 mg total) by mouth daily., Disp: 270 tablet, Rfl: 1 .  sodium chloride (OCEAN) 0.65 % SOLN nasal spray, Place 1 spray into both nostrils as needed for congestion., Disp: , Rfl:  .  telmisartan (MICARDIS) 80 MG tablet, Take 1 tablet (80 mg total) by mouth daily., Disp: 90 tablet, Rfl: 1 .  TURMERIC PO, Take 1 capsule by mouth daily., Disp: , Rfl:  .  doxycycline (VIBRA-TABS) 100 MG tablet, Take 1 tablet (100 mg total) by mouth 2 (two) times daily., Disp: 14 tablet, Rfl: 0 .  ezetimibe (ZETIA) 10 MG tablet, Take 1 tablet (10 mg total)  by mouth daily., Disp: 30 tablet, Rfl: 5 .  mirabegron ER (MYRBETRIQ) 50 MG TB24 tablet, Take 1 tablet (50 mg total) by mouth daily., Disp: 30 tablet, Rfl: 5  EXAM:   General impression: alert, cooperative and articulate.  No signs of being in distress  Lungs: speech is fluent sentence length suggests that patient is not short of breath and not punctuated by cough, sneezing or sniffing. Marland Kitchen   Psych: affect normal.  speech is articulate and non pressured .  Denies suicidal thoughts    ASSESSMENT AND PLAN:  Discussed the following assessment and plan:  Restless legs syndrome  Major depressive disorder with single episode, in partial remission (HCC)  Irritable bowel syndrome with both constipation and diarrhea  Hypothyroidism due to acquired atrophy of thyroid  Prediabetes  Chronic insomnia  Mixed hyperlipidemia - Plan: ezetimibe (ZETIA) 10 MG tablet  Tick bite of back, initial encounter  Restless legs syndrome Did not tolerate trial of  mirapex . She does not want medication at this time   Major depressive disorder with single episode, in partial remission ([place]) Symptoms are stable on current therapy of sertraline . No changes today.    IBS (irritable bowel syndrome) Advised to resume fiber supplement with Citrucel   Hypothyroidism due to acquired atrophy of thyroid Thyroid  function is WNL on current dose.  No current changes needed.   Lab Results  Component Value Date   TSH 2.59 12/20/2018     Prediabetes Her A1c is  Lower  but still suggestive  Pre prediabetes .  I recommend she follow a low glycemic index diet and particpate regularly in an aerobic  exercise activity.  We should check an A1c in 6 months.   Lab Results  Component Value Date   HGBA1C 5.9 12/20/2018    Chronic insomnia Chronic, with no improvement using melatonin .  She prefers to avoid medication. Reviewed principles of good sleep hygiene. Although she is a snorer, there is no report of apneic spells by husband. Advised to limit use of iphones and ipads,.  Changing medication for OAB to reduce nocturia   Mixed hyperlipidemia Untreated due to persistent myalgias while taking simvastatin. Her 10 yr risk of events using FRC is 20%   Will recommend trial of Zetia  Lab Results  Component Value Date   CHOL 247 (H) 12/20/2018   HDL 34.00 (L) 12/20/2018   LDLCALC 177 (H) 12/20/2018   LDLDIRECT 147.0 01/15/2018   TRIG 179.0 (H) 12/20/2018   CHOLHDL 7 12/20/2018   Lab Results  Component Value Date   ALT 28 12/20/2018   AST 25 12/20/2018   ALKPHOS 54 12/20/2018   BILITOT 0.9 12/20/2018       Tick bite of back Starting doxycycline given persistent irritation x 2 weeks .  Probiotic advised     I discussed the assessment and treatment plan with the patient. The patient was provided an opportunity to ask questions and all were answered. The patient agreed with the plan and demonstrated an understanding of the instructions.   The patient was advised to call back or seek an in-person evaluation if the symptoms worsen or if the condition fails to improve as anticipated.  I provided 35 minutes of non-face-to-face time during this encounter.   Crecencio Mc, MD

## 2018-12-24 DIAGNOSIS — S30860A Insect bite (nonvenomous) of lower back and pelvis, initial encounter: Secondary | ICD-10-CM | POA: Insufficient documentation

## 2018-12-24 MED ORDER — EZETIMIBE 10 MG PO TABS: 10.0000 mg | ORAL_TABLET | Freq: Every day | ORAL | 5 refills | Status: DC

## 2018-12-24 NOTE — Assessment & Plan Note (Addendum)
Did not tolerate trial of  mirapex . She does not want medication at this time

## 2018-12-24 NOTE — Assessment & Plan Note (Signed)
Untreated due to persistent myalgias while taking simvastatin. Her 10 yr risk of events using FRC is 20%   Will recommend trial of Zetia  Lab Results  Component Value Date   CHOL 247 (H) 12/20/2018   HDL 34.00 (L) 12/20/2018   LDLCALC 177 (H) 12/20/2018   LDLDIRECT 147.0 01/15/2018   TRIG 179.0 (H) 12/20/2018   CHOLHDL 7 12/20/2018   Lab Results  Component Value Date   ALT 28 12/20/2018   AST 25 12/20/2018   ALKPHOS 54 12/20/2018   BILITOT 0.9 12/20/2018

## 2018-12-24 NOTE — Assessment & Plan Note (Signed)
Symptoms are stable on current therapy of sertraline . No changes today.

## 2018-12-24 NOTE — Assessment & Plan Note (Signed)
Her A1c is  Lower  but still suggestive  Pre prediabetes .  I recommend she follow a low glycemic index diet and particpate regularly in an aerobic  exercise activity.  We should check an A1c in 6 months.   Lab Results  Component Value Date   HGBA1C 5.9 12/20/2018

## 2018-12-24 NOTE — Assessment & Plan Note (Signed)
Chronic, with no improvement using melatonin .  She prefers to avoid medication. Reviewed principles of good sleep hygiene. Although she is a snorer, there is no report of apneic spells by husband. Advised to limit use of iphones and ipads,.  Changing medication for OAB to reduce nocturia

## 2018-12-24 NOTE — Assessment & Plan Note (Signed)
Starting doxycycline given persistent irritation x 2 weeks .  Probiotic advised

## 2018-12-24 NOTE — Assessment & Plan Note (Signed)
Advised to resume fiber supplement with Citrucel

## 2018-12-24 NOTE — Assessment & Plan Note (Signed)
Thyroid function is WNL on current dose.  No current changes needed.   Lab Results  Component Value Date   TSH 2.59 12/20/2018

## 2019-01-07 ENCOUNTER — Telehealth: Payer: Self-pay | Admitting: Internal Medicine

## 2019-01-07 NOTE — Telephone Encounter (Signed)
Placed in yellow results folder.  °

## 2019-01-07 NOTE — Telephone Encounter (Signed)
Pt dropped off BP readings for the last month. Readings are up front in color folder.

## 2019-01-09 NOTE — Telephone Encounter (Signed)
I COULD NOT FIND THEM IN YELLOW CHART

## 2019-01-10 NOTE — Telephone Encounter (Signed)
Its in the one that is still here at the office.

## 2019-01-10 NOTE — Telephone Encounter (Signed)
Her blood pressure readings are normal , and her pulse is normal too. .  Continue current medications

## 2019-01-13 NOTE — Telephone Encounter (Signed)
Spoke with pt to let her know that her bp readings and pulse readings are all normal and that she needs to continue taking her current medications. Pt gave a verbal understanding.

## 2019-03-10 ENCOUNTER — Other Ambulatory Visit: Payer: Self-pay | Admitting: Internal Medicine

## 2019-03-10 NOTE — Telephone Encounter (Signed)
Copied from [place] [id number]. Topic: Quick Communication - Rx Refill/Question >> Mar 10, 2019  3:29 PM Nils Flack wrote: Medication: mirabegron ER (MYRBETRIQ) 50 MG TB24 tablet  Has the patient contacted their pharmacy? Yes.   (Agent: If no, request that the patient contact the pharmacy for the refill.) (Agent: If yes, when and what did the pharmacy advise?)  Preferred Pharmacy (with phone number or street name): [facility] Pt needs [facility] to [facility] of [facility] for financial reasons   Also, pt is asking if anything else needs a refill [facility] to [facility]     Agent: Please be advised that RX refills may take up to 3 business days. We ask that you follow-up with your pharmacy.

## 2019-03-11 DIAGNOSIS — R05 Cough: Secondary | ICD-10-CM | POA: Diagnosis not present

## 2019-03-11 MED ORDER — MIRABEGRON ER 50 MG PO TB24: 50.0000 mg | ORAL_TABLET | Freq: Every day | ORAL | 1 refills | Status: AC

## 2019-03-11 NOTE — Telephone Encounter (Signed)
Refill sent to [facility] as requested.

## 2019-04-04 ENCOUNTER — Other Ambulatory Visit: Payer: Self-pay | Admitting: Internal Medicine

## 2019-04-04 DIAGNOSIS — E782 Mixed hyperlipidemia: Secondary | ICD-10-CM

## 2019-04-04 MED ORDER — EZETIMIBE 10 MG PO TABS: 10.0000 mg | ORAL_TABLET | Freq: Every day | ORAL | 0 refills | Status: DC

## 2019-04-04 NOTE — Telephone Encounter (Signed)
Requested Prescriptions  Pending Prescriptions Disp Refills  . ezetimibe (ZETIA) 10 MG tablet 90 tablet 0    Sig: Take 1 tablet (10 mg total) by mouth daily.     Cardiovascular:  Antilipid - Sterol Transport Inhibitors Failed - 04/04/2019 12:05 PM      Failed - Total Cholesterol in normal range and within 360 days    Cholesterol  Date Value Ref Range Status  12/20/2018 247 (H) 0 - 200 mg/dL Final    Comment:    ATP III Classification       Desirable:  < 200 mg/dL               Borderline High:  200 - 239 mg/dL          High:  > = 240 mg/dL         Failed - LDL in normal range and within 360 days    LDL Cholesterol  Date Value Ref Range Status  12/20/2018 177 (H) 0 - 99 mg/dL Final         Failed - HDL in normal range and within 360 days    HDL  Date Value Ref Range Status  12/20/2018 34.00 (L) >39.00 mg/dL Final         Failed - Triglycerides in normal range and within 360 days    Triglycerides  Date Value Ref Range Status  12/20/2018 179.0 (H) 0.0 - 149.0 mg/dL Final    Comment:    Normal:  <150 mg/dLBorderline High:  150 - 199 mg/dL         Passed - Valid encounter within last 12 months    Recent Outpatient Visits          3 months ago Restless legs syndrome   [place] Crecencio Mc, MD   1 year ago Olecranon bursitis, right elbow   [place], [place], [place]   1 year ago Encounter for preventive health examination   [place] Crecencio Mc, MD   1 year ago Hypothyroidism due to acquired atrophy of thyroid   [facility] Crecencio Mc, MD   2 years ago Encounter for preventive health examination   [place], [place], MD      Future Appointments            In 1 month O'Brien-Blaney, Bryson Corona, LPN [place], [place]   In 1 month Derrel Nip, Aris Everts, MD [place], [facility]

## 2019-04-04 NOTE — Telephone Encounter (Signed)
Medication Refill - Medication: ezetimibe (ZETIA) 10 MG tablet   Has the patient contacted their pharmacy? Yes.   (Agent: If no, request that the patient contact the pharmacy for the refill.) (Agent: If yes, when and what did the pharmacy advise?)  Preferred Pharmacy (with phone number or street name):  [place] [address] [place] [address]  Phone: [phone] Fax: [phone]     Agent: Please be advised that RX refills may take up to 3 business days. We ask that you follow-up with your pharmacy.

## 2019-04-09 ENCOUNTER — Telehealth: Payer: Self-pay | Admitting: Internal Medicine

## 2019-04-09 NOTE — Progress Notes (Signed)
Patient Care Team: Katherine Mc, MD as PCP - General (Internal Medicine) Katherine Sprang, MD as PCP - Cardiology (Cardiology)   HPI  Katherine Hudson is a 73 y.o. female Seen in follow-up for atrial flutter. She presented with an atypical flutter but without an antecedent history of cardiac instrumentation. It was presumed to be reverse typical flutter and 6/18 she underwent catheter ablation. Unfortunately, during the procedure she reverted spontaneously to a left atrial flutter. Flecainide therapy was initiated. However, on 2 occasions she has presented for flecainide assessment on treadmill testing and been found to be in atrial fibrillation.  Because of symptoms of shortness of breath and dizziness attributed to her atrial arrhythmias, she saw Dr. Greggory Hudson  11/18 for consideration of ablation.  At that time, he noted that she was taking her flecainide once daily, she was having few symptoms, and she was not interested in pursuing ablation.  She was tried on propafenone, and didn't like its effects either, the most obnoxious drug to her though has been diltiazem  Now for weeks she has had on again off again rapid heart rates 150s-160s sometimes told by her blood pressure machine it is irregular.  In between she has had heart rates in the 50s.  For the last 48-72 hours her heart rate has been rapid persistently.  This is accompanied by some dyspnea and lightheadedness.  No edema.  She has taken her Xarelto daily and has missed no doses.  She and her husband are under a great deal of stress.  Her son, Katherine Hudson, has struggled with suicide intention  DATE TEST    6/18    Echo   EF 60-65 % LAE (43/2.2/44) E/E' normal         Date Cr K Hgb LDL  6/18 1.25  14.5    11/18 0.93 4.2 15.3   8/19 1.05 4.0    5/20 0.83 4.3 15.2 177   DATE PR interval QRSduration Dose-Flecainide  4/18  146 70 0  7/18 160 76 100 (qd)  6/19 172 72 50 (bid)   12/19 160 76 50           Antiarrhythmics Date  Reason stopped  Flecainide 2019 SE  Propafenone 2020 SE      DATE TEST EF   18 Echo   55-65 % LAE 43/2.15/45                GXT-- peak HR  96 (64% predicted max)    Past Medical History:  Diagnosis Date  . Depression   . History of echocardiogram    a. 01/2017 Echo: EF 60-65%, no rwma, mild MR, mildly to mod dil LA. nl RV fxn.  . Hyperlipidemia   . Hypertension   . Left atrial flutter by electrocardiogram [facility])    a. demonstrated by Dr Katherine Hudson on EPS 6/18, not ablated.  . Paroxysmal atrial fibrillation (HCC)    a. CHA2DS2VASc = 3-->Xarelto.  . Typical atrial flutter [facility])    s/p CTI ablation by Dr Katherine Hudson 6/18    Past Surgical History:  Procedure Laterality Date  . A-FLUTTER ABLATION N/A 02/02/2017   CTI ablation by Dr Katherine Hudson for typical atrial flutter.  Left atrial flutter also observed and cardioverted  . APPENDECTOMY    . KNEE SURGERY  2000   right  . ROTATOR CUFF REPAIR  1998   right    Current Outpatient Medications  Medication Sig Dispense Refill  . cetirizine (ZYRTEC)  10 MG tablet Take 10 mg by mouth daily as needed for allergies.    . Cholecalciferol (VITAMIN D3) 1000 units CAPS Take 1,000 Units by mouth daily.    Marland Kitchen CINNAMON PO Take 2,000 mg by mouth daily.    . Coenzyme Q10 (CO Q-10) 100 MG CAPS Take 100 mg by mouth daily.    . Cranberry 200 MG CAPS Take 400 mg by mouth daily.     . diphenhydrAMINE (BENADRYL) 2 % cream Apply 1 application topically as needed (bug bites).    . ezetimibe (ZETIA) 10 MG tablet Take 1 tablet (10 mg total) by mouth daily. 90 tablet 0  . fluticasone (FLONASE) 50 MCG/ACT nasal spray Place 2 sprays at bedtime into both nostrils.    Javier Docker Oil 500 MG CAPS Take 500 mg by mouth daily.    . Melatonin 10 MG TABS Take 10 mg by mouth at bedtime as needed (sleep).    . mirabegron ER (MYRBETRIQ) 50 MG TB24 tablet Take 1 tablet (50 mg total) by mouth daily. 90 tablet 1  . Multiple Vitamin (MULTIVITAMIN) tablet Take 1 tablet by mouth daily.       . Naphazoline HCl (CLEAR EYES OP) Apply 1 drop to eye daily as needed (irritation).    . pramoxine (PROCTOFOAM) 1 % foam Place 1 application rectally 3 (three) times daily as needed for itching. 15 g 0  . Probiotic Product (PROBIOTIC PO) Take 1 tablet by mouth daily.    . Psyllium (METAMUCIL FIBER PO) Take by mouth.    . rivaroxaban (XARELTO) 20 MG TABS tablet TAKE 1 TABLET BY MOUTH ONCE DAILY WITH  SUPPER 90 tablet 1  . sertraline (ZOLOFT) 50 MG tablet Take 3 tablets (150 mg total) by mouth daily. 270 tablet 1  . sodium chloride (OCEAN) 0.65 % SOLN nasal spray Place 1 spray into both nostrils as needed for congestion.    Marland Kitchen telmisartan (MICARDIS) 80 MG tablet Take 1 tablet (80 mg total) by mouth daily. 90 tablet 1  . TURMERIC PO Take 1 capsule by mouth daily.    . flecainide (TAMBOCOR) 100 MG tablet Take 1 tablet (100 mg) by mouth twice daily 60 tablet 1  . verapamil (CALAN-SR) 180 MG CR tablet Take 1 tablet (180 mg) by mouth once daily 30 tablet 1   No current facility-administered medications for this visit.     Allergies  Allergen Reactions  . Penicillins Other (See Comments)    As a child-unknown Has patient had a PCN reaction causing immediate rash, facial/tongue/throat swelling, SOB or lightheadedness with hypotension: Unknown Has patient had a PCN reaction causing severe rash involving mucus membranes or skin necrosis: Unknown Has patient had a PCN reaction that required hospitalization: No Has patient had a PCN reaction occurring within the last 10 years: No If all of the above answers are "NO", then may proceed with Cephalosporin use.  Marland Kitchen Percocet [Oxycodone-Acetaminophen] Other (See Comments)    Hallucinations   . Latex Rash and Other (See Comments)    Knee scabbed over after using bandage      Review of Systems negative except from HPI and PMH  Physical Exam BP 104/80 (BP Location: Left Arm, Patient Position: Sitting, Cuff Size: Normal)   Pulse (!) 147   Ht 5\' 6"   (1.676 m)   Wt 169 lb 8 oz (76.9 kg)   SpO2 98%   BMI 27.36 kg/m  Well developed and nourished in no acute distress HENT normal Neck supple  *  Clear Rapid but regular rate and rhythm, no murmurs or gallops Abd-soft with active BS No Clubbing cyanosis edema Skin-warm and dry A & Oriented  Grossly normal sensory and motor function  ECG atrial flutter with 2:1 conduction Demonstrated with CSM     Assessment and  Plan  Atrial flutter-atypical and reverse typical  Atrial fibrillation/left atrial flutters  Dizziness improved  DOE  Hypotension/hypertension  Bradycardia ? iatrogenic chronotropic incompetence   Reversion to atrial flutter/fibrillation over the last couple of weeks is characterized by on again off again tachycardia.  Knowing that her sinus rates are in the 50s, I suspect that this does not represent atrial fibrillation/flutter with variable rates although it could.  I suspect more likely that it represents paroxysms of atrial arrhythmia and now persistence of atrial arrhythmia.  We have reviewed these 2 possibilities; she has been averse to taking antiarrhythmic drugs long-term.  She has been particularly averse to diltiazem.  She is agreeable, having taken all of her Xarelto regularly, to go on a short-term table facilitated cardioversion trial.  Hence, we will begin her on flecainide 100 mg twice daily with a plan for cardioversion next week and a terminating of her flecainide about 3 weeks after that.  We will also begin her on verapamil 180 mg as a rate controlling agent adjunctive to her 1c antiarrhythmic therapy.  We discussed also that in the event that she has frequent recurrences, will need to give repeat consideration to antiarrhythmic options including dofetilide or amiodarone, ablative procedures either PVI or AV junction ablation and pacing.

## 2019-04-09 NOTE — Telephone Encounter (Signed)
STAT if HR is under 50 or over 120 (normal HR is 60-100 beats per minute)  1) What is your heart rate? Right now 137  2) Do you have a log of your heart rate readings (document readings)? Has been up to 167   3) Do you have any other symptoms? Dizzy, chest pressure   Patient would like to see Dr Caryl Comes - next available not til 9/24 - please advise on sooner appt.

## 2019-04-09 NOTE — Telephone Encounter (Signed)
I spoke with the patient. She states she has had intermittent AF x 2 weeks, but she has been consistently out of rhythm x 2 days with HR's in the 130-160's.  HR is currently 150 bpm/ BP- 108/69. She is taking her xarelto 20 mg once daily without any missed doses.  She has been off an antiarrhythmic drug x 6-7 months as she did not like how these made her feel.   I reviewed the above with Dr. Caryl Comes. MD recommendations are: 1) to have a direct admission to [facility] to initiate an alternative antiarrhythmic and possible DCCV.  2) or if stable she may come to the office tomorrow morning at 8:00 am for an appt with Dr. Caryl Comes.  The patient has been made aware of MD recommendations.  She does not want to go to the hospital. She would like to come in to be seen by Dr. Caryl Comes tomorrow.       COVID-19 Pre-Screening Questions:  . In the past 7 to 10 days have you had a cough,  shortness of breath, headache, congestion, fever (100 or greater) body aches, chills, sore throat, or sudden loss of taste or sense of smell? NO . Have you been around anyone with known Covid 19. NO . Have you been around anyone who is awaiting Covid 19 test results in the past 7 to 10 days? NO . Have you been around anyone who has been exposed to Covid 19, or has mentioned symptoms of Covid 19 within the past 7 to 10 days? NO  If you have any concerns/questions about symptoms patients report during screening (either on the phone or at threshold). Contact the provider seeing the patient or DOD for further guidance.  If neither are available contact a member of the leadership team.

## 2019-04-09 NOTE — H&P (View-Only) (Signed)
Patient Care Team: Crecencio Mc, MD as PCP - General (Internal Medicine) Deboraha Sprang, MD as PCP - Cardiology (Cardiology)   HPI  Katherine Hudson is a 73 y.o. female Seen in follow-up for atrial flutter. She presented with an atypical flutter but without an antecedent history of cardiac instrumentation. It was presumed to be reverse typical flutter and 6/18 she underwent catheter ablation. Unfortunately, during the procedure she reverted spontaneously to a left atrial flutter. Flecainide therapy was initiated. However, on 2 occasions she has presented for flecainide assessment on treadmill testing and been found to be in atrial fibrillation.  Because of symptoms of shortness of breath and dizziness attributed to her atrial arrhythmias, she saw Dr. Greggory Brandy  11/18 for consideration of ablation.  At that time, he noted that she was taking her flecainide once daily, she was having few symptoms, and she was not interested in pursuing ablation.  She was tried on propafenone, and didn't like its effects either, the most obnoxious drug to her though has been diltiazem  Now for weeks she has had on again off again rapid heart rates 150s-160s sometimes told by her blood pressure machine it is irregular.  In between she has had heart rates in the 50s.  For the last 48-72 hours her heart rate has been rapid persistently.  This is accompanied by some dyspnea and lightheadedness.  No edema.  She has taken her Xarelto daily and has missed no doses.  She and her husband are under a great deal of stress.  Her son, Katherine Hudson, has struggled with suicide intention  DATE TEST    6/18    Echo   EF 60-65 % LAE (43/2.2/44) E/E' normal         Date Cr K Hgb LDL  6/18 1.25  14.5    11/18 0.93 4.2 15.3   8/19 1.05 4.0    5/20 0.83 4.3 15.2 177   DATE PR interval QRSduration Dose-Flecainide  4/18  146 70 0  7/18 160 76 100 (qd)  6/19 172 72 50 (bid)   12/19 160 76 50           Antiarrhythmics Date  Reason stopped  Flecainide 2019 SE  Propafenone 2020 SE      DATE TEST EF   18 Echo   55-65 % LAE 43/2.15/45                GXT-- peak HR  96 (64% predicted max)    Past Medical History:  Diagnosis Date  . Depression   . History of echocardiogram    a. 01/2017 Echo: EF 60-65%, no rwma, mild MR, mildly to mod dil LA. nl RV fxn.  . Hyperlipidemia   . Hypertension   . Left atrial flutter by electrocardiogram [facility])    a. demonstrated by Dr Lovena Le on EPS 6/18, not ablated.  . Paroxysmal atrial fibrillation (HCC)    a. CHA2DS2VASc = 3-->Xarelto.  . Typical atrial flutter [facility])    s/p CTI ablation by Dr Lovena Le 6/18    Past Surgical History:  Procedure Laterality Date  . A-FLUTTER ABLATION N/A 02/02/2017   CTI ablation by Dr Lovena Le for typical atrial flutter.  Left atrial flutter also observed and cardioverted  . APPENDECTOMY    . KNEE SURGERY  2000   right  . ROTATOR CUFF REPAIR  1998   right    Current Outpatient Medications  Medication Sig Dispense Refill  . cetirizine (ZYRTEC)  10 MG tablet Take 10 mg by mouth daily as needed for allergies.    . Cholecalciferol (VITAMIN D3) 1000 units CAPS Take 1,000 Units by mouth daily.    Marland Kitchen CINNAMON PO Take 2,000 mg by mouth daily.    . Coenzyme Q10 (CO Q-10) 100 MG CAPS Take 100 mg by mouth daily.    . Cranberry 200 MG CAPS Take 400 mg by mouth daily.     . diphenhydrAMINE (BENADRYL) 2 % cream Apply 1 application topically as needed (bug bites).    . ezetimibe (ZETIA) 10 MG tablet Take 1 tablet (10 mg total) by mouth daily. 90 tablet 0  . fluticasone (FLONASE) 50 MCG/ACT nasal spray Place 2 sprays at bedtime into both nostrils.    Javier Docker Oil 500 MG CAPS Take 500 mg by mouth daily.    . Melatonin 10 MG TABS Take 10 mg by mouth at bedtime as needed (sleep).    . mirabegron ER (MYRBETRIQ) 50 MG TB24 tablet Take 1 tablet (50 mg total) by mouth daily. 90 tablet 1  . Multiple Vitamin (MULTIVITAMIN) tablet Take 1 tablet by mouth daily.       . Naphazoline HCl (CLEAR EYES OP) Apply 1 drop to eye daily as needed (irritation).    . pramoxine (PROCTOFOAM) 1 % foam Place 1 application rectally 3 (three) times daily as needed for itching. 15 g 0  . Probiotic Product (PROBIOTIC PO) Take 1 tablet by mouth daily.    . Psyllium (METAMUCIL FIBER PO) Take by mouth.    . rivaroxaban (XARELTO) 20 MG TABS tablet TAKE 1 TABLET BY MOUTH ONCE DAILY WITH  SUPPER 90 tablet 1  . sertraline (ZOLOFT) 50 MG tablet Take 3 tablets (150 mg total) by mouth daily. 270 tablet 1  . sodium chloride (OCEAN) 0.65 % SOLN nasal spray Place 1 spray into both nostrils as needed for congestion.    Marland Kitchen telmisartan (MICARDIS) 80 MG tablet Take 1 tablet (80 mg total) by mouth daily. 90 tablet 1  . TURMERIC PO Take 1 capsule by mouth daily.    . flecainide (TAMBOCOR) 100 MG tablet Take 1 tablet (100 mg) by mouth twice daily 60 tablet 1  . verapamil (CALAN-SR) 180 MG CR tablet Take 1 tablet (180 mg) by mouth once daily 30 tablet 1   No current facility-administered medications for this visit.     Allergies  Allergen Reactions  . Penicillins Other (See Comments)    As a child-unknown Has patient had a PCN reaction causing immediate rash, facial/tongue/throat swelling, SOB or lightheadedness with hypotension: Unknown Has patient had a PCN reaction causing severe rash involving mucus membranes or skin necrosis: Unknown Has patient had a PCN reaction that required hospitalization: No Has patient had a PCN reaction occurring within the last 10 years: No If all of the above answers are "NO", then may proceed with Cephalosporin use.  Marland Kitchen Percocet [Oxycodone-Acetaminophen] Other (See Comments)    Hallucinations   . Latex Rash and Other (See Comments)    Knee scabbed over after using bandage      Review of Systems negative except from HPI and PMH  Physical Exam BP 104/80 (BP Location: Left Arm, Patient Position: Sitting, Cuff Size: Normal)   Pulse (!) 147   Ht 5\' 6"   (1.676 m)   Wt 169 lb 8 oz (76.9 kg)   SpO2 98%   BMI 27.36 kg/m  Well developed and nourished in no acute distress HENT normal Neck supple  *  Clear Rapid but regular rate and rhythm, no murmurs or gallops Abd-soft with active BS No Clubbing cyanosis edema Skin-warm and dry A & Oriented  Grossly normal sensory and motor function  ECG atrial flutter with 2:1 conduction Demonstrated with CSM     Assessment and  Plan  Atrial flutter-atypical and reverse typical  Atrial fibrillation/left atrial flutters  Dizziness improved  DOE  Hypotension/hypertension  Bradycardia ? iatrogenic chronotropic incompetence   Reversion to atrial flutter/fibrillation over the last couple of weeks is characterized by on again off again tachycardia.  Knowing that her sinus rates are in the 50s, I suspect that this does not represent atrial fibrillation/flutter with variable rates although it could.  I suspect more likely that it represents paroxysms of atrial arrhythmia and now persistence of atrial arrhythmia.  We have reviewed these 2 possibilities; she has been averse to taking antiarrhythmic drugs long-term.  She has been particularly averse to diltiazem.  She is agreeable, having taken all of her Xarelto regularly, to go on a short-term table facilitated cardioversion trial.  Hence, we will begin her on flecainide 100 mg twice daily with a plan for cardioversion next week and a terminating of her flecainide about 3 weeks after that.  We will also begin her on verapamil 180 mg as a rate controlling agent adjunctive to her 1c antiarrhythmic therapy.  We discussed also that in the event that she has frequent recurrences, will need to give repeat consideration to antiarrhythmic options including dofetilide or amiodarone, ablative procedures either PVI or AV junction ablation and pacing.

## 2019-04-09 NOTE — Telephone Encounter (Signed)
Attempted to call the patient. No answer- I left a message for the patient to call back.  

## 2019-04-10 ENCOUNTER — Telehealth: Payer: Self-pay | Admitting: Internal Medicine

## 2019-04-10 ENCOUNTER — Other Ambulatory Visit: Payer: Self-pay | Admitting: Internal Medicine

## 2019-04-10 ENCOUNTER — Other Ambulatory Visit: Payer: Self-pay

## 2019-04-10 ENCOUNTER — Ambulatory Visit (INDEPENDENT_AMBULATORY_CARE_PROVIDER_SITE_OTHER): Payer: Medicare Other | Admitting: Internal Medicine

## 2019-04-10 ENCOUNTER — Encounter: Payer: Self-pay | Admitting: Internal Medicine

## 2019-04-10 VITALS — BP 104/80 | HR 147 | Ht 66.0 in | Wt 169.5 lb

## 2019-04-10 DIAGNOSIS — I484 Atypical atrial flutter: Secondary | ICD-10-CM

## 2019-04-10 DIAGNOSIS — R001 Bradycardia, unspecified: Secondary | ICD-10-CM | POA: Diagnosis not present

## 2019-04-10 DIAGNOSIS — I480 Paroxysmal atrial fibrillation: Secondary | ICD-10-CM

## 2019-04-10 DIAGNOSIS — Z01812 Encounter for preprocedural laboratory examination: Secondary | ICD-10-CM

## 2019-04-10 MED ORDER — VERAPAMIL HCL ER 180 MG PO TBCR: EXTENDED_RELEASE_TABLET | ORAL | 1 refills | Status: DC

## 2019-04-10 MED ORDER — FLECAINIDE ACETATE 100 MG PO TABS: ORAL_TABLET | ORAL | 1 refills | Status: DC

## 2019-04-10 NOTE — Telephone Encounter (Signed)
Attempted to call the patient to confirm the date/ time for her DCCV.  No answer- I left a detailed message this is set for Tuesday 04/15/19: arrive at 7:00 am for an 8:00 am procedure. [facility] [place], 1st desk on the right. She was given all other pre-procedure instructions in clinic today.  I asked that she call back to confirm receipt of this message.

## 2019-04-10 NOTE — Patient Instructions (Addendum)
Medication Instructions:  - Your physician has recommended you make the following change in your medication:   1) Start flecainide 100 mg- take 1 tablet by mouth twice daily  2) Start verapamil 180 mg- take 1 tablet by mouth once daily  If you need a refill on your cardiac medications before your next appointment, please call your pharmacy.   Lab work: - Your physician recommends that you have lab work: BMP/ CBC  If you have labs (blood work) drawn today and your tests are completely normal, you will receive your results only by: Marland Kitchen MyChart Message (if you have MyChart) OR . A paper copy in the mail If you have any lab test that is abnormal or we need to change your treatment, we will call you to review the results.  Testing/Procedures: - Your physician has recommended that you have a Cardioversion (DCCV). Electrical Cardioversion uses a jolt of electricity to your heart either through paddles or wired patches attached to your chest. This is a controlled, usually prescheduled, procedure. Defibrillation is done under light anesthesia in the hospital, and you usually go home the day of the procedure. This is done to get your heart back into a normal rhythm. You are not awake for the procedure. Please see the instruction sheet given to you today.  You are scheduled for a Cardioversion on ________________ with Dr.___________ Please arrive at the [facility] at _________ a.m. on the day of your procedure.  DIET INSTRUCTIONS:  Nothing to eat or drink after midnight except your medications with a              sip of water.         1) Labs: - BMP/ CBC- today                  - COVID swab- Friday 04/11/19 (12:30-2:30 pm): drive up to the [facility] entrance to have this done  2) Medications:  You may take all of your regular medications the morning of your procedure with enough water to get them down safely   3) Must have a responsible person to drive you  home.  4) Bring a current list of your medications and current insurance cards.    If you have any questions after you get home, please call the office at [phone]   Follow-Up: At [facility], you and your health needs are our priority.  As part of our continuing mission to provide you with exceptional heart care, we have created designated Provider Care Teams.  These Care Teams include your primary Cardiologist (physician) and Advanced Practice Providers (APPs -  Physician Assistants and Nurse Practitioners) who all work together to provide you with the care you need, when you need it. . in 3-4 weeks with Dr. Caryl Comes  Any Other Special Instructions Will Be Listed Below (If Applicable). - N/A

## 2019-04-11 ENCOUNTER — Other Ambulatory Visit
Admission: RE | Admit: 2019-04-11 | Discharge: 2019-04-11 | Disposition: A | Discharge status: Home / Self Care | Payer: Medicare Other | Source: Ambulatory Visit | Attending: Internal Medicine | Admitting: Internal Medicine

## 2019-04-11 DIAGNOSIS — Z01812 Encounter for preprocedural laboratory examination: Secondary | ICD-10-CM | POA: Insufficient documentation

## 2019-04-11 DIAGNOSIS — Z20828 Contact with and (suspected) exposure to other viral communicable diseases: Secondary | ICD-10-CM | POA: Insufficient documentation

## 2019-04-11 LAB — CBC WITH DIFFERENTIAL/PLATELET
Basophils Absolute: 0.1 10*3/uL (ref 0.0–0.2)
Basos: 1 %
EOS (ABSOLUTE): 0.2 10*3/uL (ref 0.0–0.4)
Eos: 3 %
Hematocrit: 47.9 % — ABNORMAL HIGH (ref 34.0–46.6)
Hemoglobin: 15.7 g/dL (ref 11.1–15.9)
Immature Grans (Abs): 0 10*3/uL (ref 0.0–0.1)
Immature Granulocytes: 0 %
Lymphocytes Absolute: 1.8 10*3/uL (ref 0.7–3.1)
Lymphs: 26 %
MCH: 28.2 pg (ref 26.6–33.0)
MCHC: 32.8 g/dL (ref 31.5–35.7)
MCV: 86 fL (ref 79–97)
Monocytes Absolute: 0.5 10*3/uL (ref 0.1–0.9)
Monocytes: 7 %
Neutrophils Absolute: 4.4 10*3/uL (ref 1.4–7.0)
Neutrophils: 63 %
Platelets: 172 10*3/uL (ref 150–450)
RBC: 5.56 x10E6/uL — ABNORMAL HIGH (ref 3.77–5.28)
RDW: 14.5 % (ref 11.7–15.4)
WBC: 6.9 10*3/uL (ref 3.4–10.8)

## 2019-04-11 LAB — BASIC METABOLIC PANEL
BUN/Creatinine Ratio: 28 (ref 12–28)
BUN: 26 mg/dL (ref 8–27)
CO2: 18 mmol/L — ABNORMAL LOW (ref 20–29)
Calcium: 9.7 mg/dL (ref 8.7–10.3)
Chloride: 105 mmol/L (ref 96–106)
Creatinine, Ser: 0.94 mg/dL (ref 0.57–1.00)
GFR calc Af Amer: 70 mL/min/{1.73_m2} (ref >59)
GFR calc non Af Amer: 60 mL/min/{1.73_m2} (ref >59)
Glucose: 94 mg/dL (ref 65–99)
Potassium: 4.5 mmol/L (ref 3.5–5.2)
Sodium: 142 mmol/L (ref 134–144)

## 2019-04-12 LAB — SARS CORONAVIRUS 2 (TAT 6-24 HRS): SARS Coronavirus 2: NEGATIVE

## 2019-04-15 ENCOUNTER — Ambulatory Visit: Payer: Medicare Other | Admitting: Anesthesiology

## 2019-04-15 ENCOUNTER — Ambulatory Visit
Admission: RE | Admit: 2019-04-15 | Discharge: 2019-04-15 | Disposition: A | Discharge status: Home / Self Care | Payer: Medicare Other | Attending: Internal Medicine | Admitting: Internal Medicine

## 2019-04-15 ENCOUNTER — Other Ambulatory Visit: Payer: Self-pay

## 2019-04-15 ENCOUNTER — Encounter
Admission: RE | Disposition: A | Discharge status: Home / Self Care | Payer: Self-pay | Source: Home / Self Care | Attending: Internal Medicine

## 2019-04-15 ENCOUNTER — Encounter: Payer: Self-pay | Admitting: Anesthesiology

## 2019-04-15 DIAGNOSIS — Z7901 Long term (current) use of anticoagulants: Secondary | ICD-10-CM | POA: Diagnosis not present

## 2019-04-15 DIAGNOSIS — Z880 Allergy status to penicillin: Secondary | ICD-10-CM | POA: Insufficient documentation

## 2019-04-15 DIAGNOSIS — I4891 Unspecified atrial fibrillation: Secondary | ICD-10-CM | POA: Diagnosis not present

## 2019-04-15 DIAGNOSIS — M1990 Unspecified osteoarthritis, unspecified site: Secondary | ICD-10-CM | POA: Diagnosis not present

## 2019-04-15 DIAGNOSIS — E785 Hyperlipidemia, unspecified: Secondary | ICD-10-CM | POA: Diagnosis not present

## 2019-04-15 DIAGNOSIS — Z87891 Personal history of nicotine dependence: Secondary | ICD-10-CM | POA: Diagnosis not present

## 2019-04-15 DIAGNOSIS — I480 Paroxysmal atrial fibrillation: Secondary | ICD-10-CM | POA: Diagnosis not present

## 2019-04-15 DIAGNOSIS — F329 Major depressive disorder, single episode, unspecified: Secondary | ICD-10-CM | POA: Diagnosis not present

## 2019-04-15 DIAGNOSIS — Z79899 Other long term (current) drug therapy: Secondary | ICD-10-CM | POA: Insufficient documentation

## 2019-04-15 DIAGNOSIS — I10 Essential (primary) hypertension: Secondary | ICD-10-CM | POA: Diagnosis not present

## 2019-04-15 DIAGNOSIS — I483 Typical atrial flutter: Secondary | ICD-10-CM | POA: Diagnosis not present

## 2019-04-15 DIAGNOSIS — Z885 Allergy status to narcotic agent status: Secondary | ICD-10-CM | POA: Insufficient documentation

## 2019-04-15 DIAGNOSIS — I484 Atypical atrial flutter: Secondary | ICD-10-CM

## 2019-04-15 DIAGNOSIS — E782 Mixed hyperlipidemia: Secondary | ICD-10-CM | POA: Diagnosis not present

## 2019-04-15 HISTORY — PX: CARDIOVERSION: SHX1299

## 2019-04-15 SURGERY — CARDIOVERSION
Anesthesia: General

## 2019-04-15 MED ORDER — SODIUM CHLORIDE 0.9 % IV SOLN: INTRAVENOUS | Status: DC

## 2019-04-15 MED ORDER — PROPOFOL 10 MG/ML IV BOLUS
INTRAVENOUS | Status: AC
  Filled 2019-04-15: qty 20

## 2019-04-15 MED ORDER — PROPOFOL 10 MG/ML IV BOLUS
INTRAVENOUS | Status: DC | PRN
  Administered 2019-04-15: 20 mg via INTRAVENOUS
  Administered 2019-04-15: 40 mg via INTRAVENOUS

## 2019-04-15 MED ORDER — PHENYLEPHRINE HCL (PRESSORS) 10 MG/ML IV SOLN
INTRAVENOUS | Status: AC
  Filled 2019-04-15: qty 1

## 2019-04-15 MED ORDER — FENTANYL CITRATE (PF) 100 MCG/2ML IJ SOLN: 25.0000 ug | INTRAMUSCULAR | Status: DC | PRN

## 2019-04-15 MED ORDER — ONDANSETRON HCL 4 MG/2ML IJ SOLN: 4.0000 mg | Freq: Once | INTRAMUSCULAR | Status: DC | PRN

## 2019-04-15 MED ORDER — PHENYLEPHRINE HCL (PRESSORS) 10 MG/ML IV SOLN
INTRAVENOUS | Status: DC | PRN
  Administered 2019-04-15: 100 ug via INTRAVENOUS

## 2019-04-15 NOTE — Anesthesia Post-op Follow-up Note (Signed)
Anesthesia QCDR form completed.        

## 2019-04-15 NOTE — Anesthesia Postprocedure Evaluation (Signed)
Anesthesia Post Note  Patient: Katherine Hudson  Procedure(s) Performed: CARDIOVERSION (N/A )  Patient location during evaluation: Specials Recovery Anesthesia Type: General Level of consciousness: awake and alert and oriented Pain management: pain level controlled Vital Signs Assessment: post-procedure vital signs reviewed and stable Respiratory status: spontaneous breathing Cardiovascular status: blood pressure returned to baseline Anesthetic complications: no     Last Vitals:  Vitals:   04/15/19 0845 04/15/19 0900  BP: (!) 89/53 100/66  Pulse: (!) 55 (!) 57  Resp: (!) 21 19  Temp:    SpO2: 94% 95%    Last Pain:  Vitals:   04/15/19 0900  TempSrc:   PainSc: 0-No pain                 Maclane Holloran

## 2019-04-15 NOTE — Discharge Instructions (Signed)
Moderate Conscious Sedation, Adult, Care After °These instructions provide you with information about caring for yourself after your procedure. Your health care provider may also give you more specific instructions. Your treatment has been planned according to current medical practices, but problems sometimes occur. Call your health care provider if you have any problems or questions after your procedure. °What can I expect after the procedure? °After your procedure, it is common: °· To feel sleepy for several hours. °· To feel clumsy and have poor balance for several hours. °· To have poor judgment for several hours. °· To vomit if you eat too soon. °Follow these instructions at home: °For at least 24 hours after the procedure: ° °· Do not: °? Participate in activities where you could fall or become injured. °? Drive. °? Use heavy machinery. °? Drink alcohol. °? Take sleeping pills or medicines that cause drowsiness. °? Make important decisions or sign legal documents. °? Take care of children on your own. °· Rest. °Eating and drinking °· Follow the diet recommended by your health care provider. °· If you vomit: °? Drink water, juice, or soup when you can drink without vomiting. °? Make sure you have little or no nausea before eating solid foods. °General instructions °· Have a responsible adult stay with you until you are awake and alert. °· Take over-the-counter and prescription medicines only as told by your health care provider. °· If you smoke, do not smoke without supervision. °· Keep all follow-up visits as told by your health care provider. This is important. °Contact a health care provider if: °· You keep feeling nauseous or you keep vomiting. °· You feel light-headed. °· You develop a rash. °· You have a fever. °Get help right away if: °· You have trouble breathing. °This information is not intended to replace advice given to you by your health care provider. Make sure you discuss any questions you have  with your health care provider. °Document Released: 05/14/2013 Document Revised: 07/06/2017 Document Reviewed: 11/13/2015 °Elsevier Patient Education © 2020 [employer] °Electrical Cardioversion, Care After °This sheet gives you information about how to care for yourself after your procedure. Your health care provider may also give you more specific instructions. If you have problems or questions, contact your health care provider. °What can I expect after the procedure? °After the procedure, it is common to have: °· Some redness on the skin where the shocks were given. °Follow these instructions at home: ° °· Do not drive for 24 hours if you were given a medicine to help you relax (sedative). °· Take over-the-counter and prescription medicines only as told by your health care provider. °· Ask your health care provider how to check your pulse. Check it often. °· Rest for 48 hours after the procedure or as told by your health care provider. °· Avoid or limit your caffeine use as told by your health care provider. °Contact a health care provider if: °· You feel like your heart is beating too quickly or your pulse is not regular. °· You have a serious muscle cramp that does not go away. °Get help right away if: ° °· You have discomfort in your chest. °· You are dizzy or you feel faint. °· You have trouble breathing or you are short of breath. °· Your speech is slurred. °· You have trouble moving an arm or leg on one side of your body. °· Your fingers or toes turn cold or blue. °This information is not intended to   replace advice given to you by your health care provider. Make sure you discuss any questions you have with your health care provider. °Document Released: 05/14/2013 Document Revised: 07/06/2017 Document Reviewed: 01/28/2016 °Elsevier Patient Education © 2020 Else[employer] Inc. ° °

## 2019-04-15 NOTE — CV Procedure (Signed)
    Cardioversion Note  Katherine Hudson [id number] Dec 09, 1945  Procedure: DC Cardioversion Indications: Atrial fibrillation  Procedure Details Consent: Obtained Time Out: Verified patient identification, verified procedure, site/side was marked, verified correct patient position, special equipment/implants available, Radiology Safety Procedures followed,  medications/allergies/relevent history reviewed, required imaging and test results available.  Performed  The patient has been on adequate anticoagulation.  The patient received IV propofol by anesthesia for sedation.  Synchronous cardioversion was performed at 120 joules x 1.  The cardioversion was successful with restoration of sinus bradycardia.  Complications: No apparent complications Patient did tolerate procedure well.  Nelva Bush., MD 04/15/2019, 8:10 AM

## 2019-04-15 NOTE — Telephone Encounter (Signed)
Patient had her DCCV today.

## 2019-04-15 NOTE — Anesthesia Procedure Notes (Signed)
Date/Time: 04/15/2019 8:00 AM Performed by: Allean Found, CRNA Pre-anesthesia Checklist: Emergency Drugs available, Suction available, Patient identified, Patient being monitored and Timeout performed Oxygen Delivery Method: Nasal cannula Placement Confirmation: positive ETCO2

## 2019-04-15 NOTE — Interval H&P Note (Signed)
History and Physical Interval Note:  04/15/2019 8:48 AM  Katherine Hudson  has presented today for surgery, with the diagnosis of atrial fibrillation.  The various methods of treatment have been discussed with the patient and family. After consideration of risks, benefits and other options for treatment, the patient has consented to  Procedure(s): CARDIOVERSION (N/A) as a surgical intervention.  The patient's history has been reviewed, patient examined, no change in status, stable for surgery.  I have reviewed the patient's chart and labs.  Questions were answered to the patient's satisfaction.     Amanda Steuart

## 2019-04-15 NOTE — Transfer of Care (Signed)
Immediate Anesthesia Transfer of Care Note  Patient: Katherine Hudson  Procedure(s) Performed: CARDIOVERSION (N/A )  Patient Location: PACU  Anesthesia Type:General  Level of Consciousness: awake, alert  and oriented  Airway & Oxygen Therapy: Patient Spontanous Breathing and Patient connected to nasal cannula oxygen  Post-op Assessment: Report given to RN and Post -op Vital signs reviewed and stable  Post vital signs: Reviewed and stable  Last Vitals:  Vitals Value Taken Time  BP 99/56 04/15/19 0812  Temp    Pulse 49 04/15/19 0814  Resp 21 04/15/19 0814  SpO2 99 % 04/15/19 0814  Vitals shown include unvalidated device data.  Last Pain:  Vitals:   04/15/19 0729  TempSrc: Oral  PainSc: 0-No pain         Complications: No apparent anesthesia complications

## 2019-04-15 NOTE — Anesthesia Preprocedure Evaluation (Signed)
Anesthesia Evaluation  Patient identified by MRN, date of birth, ID band Patient awake    Reviewed: Allergy & Precautions, NPO status , Patient's Chart, lab work & pertinent test results  Airway Mallampati: III       Dental   Pulmonary former smoker,    Pulmonary exam normal        Cardiovascular hypertension, + dysrhythmias Atrial Fibrillation      Neuro/Psych PSYCHIATRIC DISORDERS Depression    GI/Hepatic negative GI ROS, Neg liver ROS,   Endo/Other  negative endocrine ROS  Renal/GU negative Renal ROS  negative genitourinary   Musculoskeletal  (+) Arthritis , Osteoarthritis,    Abdominal Normal abdominal exam  (+)   Peds negative pediatric ROS (+)  Hematology negative hematology ROS (+)   Anesthesia Other Findings Past Medical History: No date: Depression No date: History of echocardiogram     Comment:  a. 01/2017 Echo: EF 60-65%, no rwma, mild MR, mildly to               mod dil LA. nl RV fxn. No date: Hyperlipidemia No date: Hypertension No date: Left atrial flutter by electrocardiogram [facility])     Comment:  a. demonstrated by Dr Lovena Le on EPS 6/18, not ablated. No date: Paroxysmal atrial fibrillation (HCC)     Comment:  a. CHA2DS2VASc = 3-->Xarelto. No date: Typical atrial flutter [facility]     Comment:  s/p CTI ablation by Dr Lovena Le 6/18  Reproductive/Obstetrics                             Anesthesia Physical Anesthesia Plan  ASA: III  Anesthesia Plan: General   Post-op Pain Management:    Induction: Intravenous  PONV Risk Score and Plan:   Airway Management Planned: Nasal Cannula  Additional Equipment:   Intra-op Plan:   Post-operative Plan:   Informed Consent: I have reviewed the patients History and Physical, chart, labs and discussed the procedure including the risks, benefits and alternatives for the proposed anesthesia with the patient or authorized representative  who has indicated his/her understanding and acceptance.     Dental advisory given  Plan Discussed with: CRNA and Surgeon  Anesthesia Plan Comments:         Anesthesia Quick Evaluation

## 2019-04-28 ENCOUNTER — Other Ambulatory Visit: Payer: Self-pay | Admitting: Internal Medicine

## 2019-04-29 ENCOUNTER — Other Ambulatory Visit: Payer: Self-pay | Admitting: Internal Medicine

## 2019-04-29 MED ORDER — TELMISARTAN 80 MG PO TABS: 80.0000 mg | ORAL_TABLET | Freq: Every day | ORAL | 1 refills | Status: DC

## 2019-04-29 NOTE — Progress Notes (Signed)
Dr End did NOT dc the telmisartan! Refill sent to [facility]

## 2019-04-29 NOTE — Telephone Encounter (Signed)
Checking with dr End since there is no documentation in chart

## 2019-04-29 NOTE — Telephone Encounter (Signed)
Looks like the Telmisartan was discontinued by Dr. Saunders Revel.

## 2019-05-01 ENCOUNTER — Ambulatory Visit (INDEPENDENT_AMBULATORY_CARE_PROVIDER_SITE_OTHER): Payer: Medicare Other | Admitting: Internal Medicine

## 2019-05-01 ENCOUNTER — Other Ambulatory Visit: Payer: Self-pay

## 2019-05-01 ENCOUNTER — Encounter: Payer: Self-pay | Admitting: Internal Medicine

## 2019-05-01 VITALS — BP 132/60 | HR 49 | Ht 66.0 in | Wt 176.0 lb

## 2019-05-01 DIAGNOSIS — I480 Paroxysmal atrial fibrillation: Secondary | ICD-10-CM | POA: Diagnosis not present

## 2019-05-01 DIAGNOSIS — R001 Bradycardia, unspecified: Secondary | ICD-10-CM

## 2019-05-01 DIAGNOSIS — I484 Atypical atrial flutter: Secondary | ICD-10-CM | POA: Diagnosis not present

## 2019-05-01 MED ORDER — VERAPAMIL HCL ER 180 MG PO TBCR: EXTENDED_RELEASE_TABLET | ORAL | 3 refills | Status: DC

## 2019-05-01 MED ORDER — FLECAINIDE ACETATE 100 MG PO TABS: ORAL_TABLET | ORAL | 3 refills | Status: DC

## 2019-05-01 NOTE — Patient Instructions (Signed)
Medication Instructions:  - Your physician recommends that you continue on your current medications as directed. Please refer to the Current Medication list given to you today.  If you need a refill on your cardiac medications before your next appointment, please call your pharmacy.   Lab work: - none ordered  If you have labs (blood work) drawn today and your tests are completely normal, you will receive your results only by: Marland Kitchen MyChart Message (if you have MyChart) OR . A paper copy in the mail If you have any lab test that is abnormal or we need to change your treatment, we will call you to review the results.  Testing/Procedures: - none ordered  Follow-Up: At [facility], you and your health needs are our priority.  As part of our continuing mission to provide you with exceptional heart care, we have created designated Provider Care Teams.  These Care Teams include your primary Cardiologist (physician) and Advanced Practice Providers (APPs -  Physician Assistants and Nurse Practitioners) who all work together to provide you with the care you need, when you need it.  You will need a follow up appointment in 6 months (Late March / early April) with Dr. Caryl Comes. Marland Kitchen  Please call our office 2 months in advance to schedule this appointment.  (Call in early January to schedule).  Any Other Special Instructions Will Be Listed Below (If Applicable). - N/A

## 2019-05-01 NOTE — Progress Notes (Signed)
Patient Care Team: Crecencio Mc, MD as PCP - General (Internal Medicine) Deboraha Sprang, MD as PCP - Cardiology (Cardiology)   HPI  Katherine Hudson is a 72 y.o. female Seen in follow-up for atrial flutter. She presented with an atypical flutter but without an antecedent history of cardiac instrumentation. It was presumed to be reverse typical flutter and 6/18 she underwent catheter ablation. Unfortunately, during the procedure she reverted spontaneously to a left atrial flutter. Flecainide therapy was initiated. However, on 2 occasions she has presented for flecainide assessment on treadmill testing and been found to be in atrial fibrillation.  Because of symptoms of shortness of breath and dizziness attributed to her atrial arrhythmias, she saw Dr. Greggory Brandy  11/18 for consideration of ablation.  At that time, he noted that she was taking her flecainide once daily, she was having few symptoms, and she was not interested in pursuing ablation.  She was tried on propafenone, and didn't like its effects either, the most obnoxious drug to her though has been diltiazem  Now for weeks she has had on again off again rapid heart rates 150s-160s sometimes told by her blood pressure machine it is irregular.  In between she has had heart rates in the 50s.  For the last 48-72 hours her heart rate has been rapid persistently.  This is accompanied by some dyspnea and lightheadedness.  No edema.  This was interpreted as persistent tachycardia.  We then chose a course of flecainide facilitated cardioversion with the anticipation of stopping the flecainide after about 3 weeks   cardioversion 04/15/2019  She has been in sinus and has felt much better;  She has none of the SE assoc with dilt  She would like to continue her flec notwithstanding the original plan to stop after 3 weeks  On Anticoagulation;  No bleeding issues   DOE but without chest pain; some edema no PND or orthopnea.  She and her husband  are under a great deal of stress.  Her son, Katherine Hudson, has struggled with suicide intention  DATE TEST    6/18    Echo   EF 60-65 % LAE (43/2.2/44) E/E' normal         Date Cr K Hgb LDL  6/18 1.25  14.5    11/18 0.93 4.2 15.3   8/19 1.05 4.0    5/20 0.83 4.3 15.2 177  9/20 0.94 4.5 15.7    DATE PR interval QRSduration Dose-Flecainide  4/18  146 70 0  7/18 160 76 100 (qd)  6/19 172 72 50 (bid)   12/19 160 76 50   9/20 188 74 100     Antiarrhythmics Date Reason stopped  Flecainide 2019 SE  Propafenone 2020 SE      DATE TEST EF   6/18 Echo   55-65 % LAE 43/2.15/45                Past Medical History:  Diagnosis Date  . Depression   . History of echocardiogram    a. 01/2017 Echo: EF 60-65%, no rwma, mild MR, mildly to mod dil LA. nl RV fxn.  . Hyperlipidemia   . Hypertension   . Left atrial flutter by electrocardiogram [facility])    a. demonstrated by Dr Lovena Le on EPS 6/18, not ablated.  . Paroxysmal atrial fibrillation (HCC)    a. CHA2DS2VASc = 3-->Xarelto.  . Typical atrial flutter [facility])    s/p CTI ablation by Dr Lovena Le 6/18  Past Surgical History:  Procedure Laterality Date  . A-FLUTTER ABLATION N/A 02/02/2017   CTI ablation by Dr Lovena Le for typical atrial flutter.  Left atrial flutter also observed and cardioverted  . APPENDECTOMY    . CARDIOVERSION N/A 04/15/2019   Procedure: CARDIOVERSION;  Surgeon: Nelva Bush, MD;  Location: [facility] ORS;  Service: Cardiovascular;  Laterality: N/A;  . KNEE SURGERY  2000   right  . ROTATOR CUFF REPAIR  1998   right    Current Outpatient Medications  Medication Sig Dispense Refill  . cetirizine (ZYRTEC) 10 MG tablet Take 10 mg by mouth daily as needed for allergies.    . Cholecalciferol (VITAMIN D3) 1000 units CAPS Take 1,000 Units by mouth daily.    Marland Kitchen CINNAMON PO Take 2,000 mg by mouth daily.    . Coenzyme Q10 (CO Q-10) 100 MG CAPS Take 100 mg by mouth daily.    . Cranberry 200 MG CAPS Take 400 mg by mouth daily.     .  diphenhydrAMINE (BENADRYL) 2 % cream Apply 1 application topically as needed (bug bites).    . ezetimibe (ZETIA) 10 MG tablet Take 1 tablet (10 mg total) by mouth daily. 90 tablet 0  . flecainide (TAMBOCOR) 100 MG tablet Take 1 tablet (100 mg) by mouth twice daily 60 tablet 1  . fluticasone (FLONASE) 50 MCG/ACT nasal spray Place 2 sprays at bedtime into both nostrils.    Javier Docker Oil 500 MG CAPS Take 500 mg by mouth daily.    . Melatonin 10 MG TABS Take 10 mg by mouth at bedtime as needed (sleep).    . mirabegron ER (MYRBETRIQ) 50 MG TB24 tablet Take 1 tablet (50 mg total) by mouth daily. 90 tablet 1  . Multiple Vitamin (MULTIVITAMIN) tablet Take 1 tablet by mouth daily.      . Naphazoline HCl (CLEAR EYES OP) Apply 1 drop to eye daily as needed (irritation).    . pramoxine (PROCTOFOAM) 1 % foam Place 1 application rectally 3 (three) times daily as needed for itching. 15 g 0  . Probiotic Product (PROBIOTIC PO) Take 1 tablet by mouth daily.    . Psyllium (METAMUCIL FIBER PO) Take by mouth.    . sertraline (ZOLOFT) 50 MG tablet TAKE 3 TABLETS BY MOUTH  DAILY 270 tablet 3  . sodium chloride (OCEAN) 0.65 % SOLN nasal spray Place 1 spray into both nostrils as needed for congestion.    Marland Kitchen telmisartan (MICARDIS) 80 MG tablet Take 1 tablet (80 mg total) by mouth daily. 90 tablet 1  . TURMERIC PO Take 1 capsule by mouth daily.    . verapamil (CALAN-SR) 180 MG CR tablet Take 1 tablet (180 mg) by mouth once daily 30 tablet 1  . XARELTO 20 MG TABS tablet TAKE 1 TABLET BY MOUTH ONCE DAILY WITH SUPPER 90 tablet 3   No current facility-administered medications for this visit.     Allergies  Allergen Reactions  . Penicillins Other (See Comments)    As a child-unknown Has patient had a PCN reaction causing immediate rash, facial/tongue/throat swelling, SOB or lightheadedness with hypotension: Unknown Has patient had a PCN reaction causing severe rash involving mucus membranes or skin necrosis: Unknown Has  patient had a PCN reaction that required hospitalization: No Has patient had a PCN reaction occurring within the last 10 years: No If all of the above answers are "NO", then may proceed with Cephalosporin use.  Marland Kitchen Percocet [Oxycodone-Acetaminophen] Other (See Comments)    Hallucinations   .  Latex Rash and Other (See Comments)    Knee scabbed over after using bandage      Review of Systems negative except from HPI and PMH  Physical Exam BP 132/60 (BP Location: Left Arm, Patient Position: Sitting, Cuff Size: Normal)   Pulse (!) 49   Ht 5\' 6"  (1.676 m)   Wt 176 lb (79.8 kg)   SpO2 98%   BMI 28.41 kg/m  Well developed and nourished in no acute distress HENT normal Neck supple with JVP-  flat   Clear Regular rate and rhythm, no murmurs or gallops Abd-soft with active BS No Clubbing cyanosis edema Skin-warm and dry A & Oriented  Grossly normal sensory and motor function  ECG sinus @ 49 19/07/49     Assessment and  Plan  Atrial flutter-atypical and reverse typical  Atrial fibrillation/left atrial flutters no [place]  DOE  Hypotension/hypertension  Bradycardia ? iatrogenic chronotropic incompetence  Holding sinus and loving it Would like to continue flec and verapamil  BP reasonable  Her bradycardia may or may not be contributing to her dyspnea--will use her pulse ox at home to measure peak HR with exertion>> this may inform drug adjustments or consideration of pacing .

## 2019-05-08 ENCOUNTER — Telehealth: Payer: Self-pay | Admitting: Internal Medicine

## 2019-05-08 DIAGNOSIS — R7303 Prediabetes: Secondary | ICD-10-CM

## 2019-05-08 DIAGNOSIS — E782 Mixed hyperlipidemia: Secondary | ICD-10-CM

## 2019-05-08 DIAGNOSIS — E034 Atrophy of thyroid (acquired): Secondary | ICD-10-CM

## 2019-05-08 NOTE — Telephone Encounter (Signed)
Pt would like to have labs drawn before her appt with Dr. Derrel Nip next Friday 10.9.20  Please advise

## 2019-05-15 ENCOUNTER — Ambulatory Visit (INDEPENDENT_AMBULATORY_CARE_PROVIDER_SITE_OTHER): Payer: Medicare Other | Admitting: Internal Medicine

## 2019-05-15 ENCOUNTER — Encounter: Payer: Self-pay | Admitting: Internal Medicine

## 2019-05-15 ENCOUNTER — Other Ambulatory Visit: Payer: Medicare Other

## 2019-05-15 ENCOUNTER — Ambulatory Visit (INDEPENDENT_AMBULATORY_CARE_PROVIDER_SITE_OTHER): Payer: Medicare Other

## 2019-05-15 ENCOUNTER — Other Ambulatory Visit: Payer: Self-pay

## 2019-05-15 DIAGNOSIS — I10 Essential (primary) hypertension: Secondary | ICD-10-CM

## 2019-05-15 DIAGNOSIS — E034 Atrophy of thyroid (acquired): Secondary | ICD-10-CM | POA: Diagnosis not present

## 2019-05-15 DIAGNOSIS — Z23 Encounter for immunization: Secondary | ICD-10-CM

## 2019-05-15 DIAGNOSIS — E039 Hypothyroidism, unspecified: Secondary | ICD-10-CM | POA: Diagnosis not present

## 2019-05-15 DIAGNOSIS — E782 Mixed hyperlipidemia: Secondary | ICD-10-CM | POA: Diagnosis not present

## 2019-05-15 DIAGNOSIS — E038 Other specified hypothyroidism: Secondary | ICD-10-CM

## 2019-05-15 DIAGNOSIS — R7303 Prediabetes: Secondary | ICD-10-CM

## 2019-05-15 DIAGNOSIS — F324 Major depressive disorder, single episode, in partial remission: Secondary | ICD-10-CM

## 2019-05-15 DIAGNOSIS — R001 Bradycardia, unspecified: Secondary | ICD-10-CM

## 2019-05-15 DIAGNOSIS — Z0000 Encounter for general adult medical examination without abnormal findings: Secondary | ICD-10-CM | POA: Diagnosis not present

## 2019-05-15 LAB — COMPREHENSIVE METABOLIC PANEL
ALT: 16 U/L (ref 0–35)
AST: 17 U/L (ref 0–37)
Albumin: 4.5 g/dL (ref 3.5–5.2)
Alkaline Phosphatase: 54 U/L (ref 39–117)
BUN: 23 mg/dL (ref 6–23)
CO2: 26 mEq/L (ref 19–32)
Calcium: 9.3 mg/dL (ref 8.4–10.5)
Chloride: 106 mEq/L (ref 96–112)
Creatinine, Ser: 0.89 mg/dL (ref 0.40–1.20)
GFR: 62.11 mL/min (ref >60.00)
Glucose, Bld: 105 mg/dL — ABNORMAL HIGH (ref 70–99)
Potassium: 4 mEq/L (ref 3.5–5.1)
Sodium: 140 mEq/L (ref 135–145)
Total Bilirubin: 1 mg/dL (ref 0.2–1.2)
Total Protein: 6.9 g/dL (ref 6.0–8.3)

## 2019-05-15 LAB — LIPID PANEL
Cholesterol: 237 mg/dL — ABNORMAL HIGH (ref 0–200)
HDL: 40.8 mg/dL (ref >39.00)
LDL Cholesterol: 160 mg/dL — ABNORMAL HIGH (ref 0–99)
NonHDL: 196.36
Total CHOL/HDL Ratio: 6
Triglycerides: 182 mg/dL — ABNORMAL HIGH (ref 0.0–149.0)
VLDL: 36.4 mg/dL (ref 0.0–40.0)

## 2019-05-15 LAB — HEMOGLOBIN A1C: Hgb A1c MFr Bld: 6 % (ref 4.6–6.5)

## 2019-05-15 LAB — TSH: TSH: 2.07 u[IU]/mL (ref 0.35–4.50)

## 2019-05-15 MED ORDER — VERAPAMIL HCL ER 120 MG PO TBCR: 120.0000 mg | EXTENDED_RELEASE_TABLET | Freq: Every day | ORAL | 1 refills | Status: DC

## 2019-05-15 MED ORDER — AMLODIPINE BESYLATE 2.5 MG PO TABS: 2.5000 mg | ORAL_TABLET | Freq: Every day | ORAL | 3 refills | Status: DC

## 2019-05-15 NOTE — Patient Instructions (Signed)
  Ms. Hornbacher , Thank you for taking time to come for your Medicare Wellness Visit. I appreciate your ongoing commitment to your health goals. Please review the following plan we discussed and let me know if I can assist you in the future.   These are the goals we discussed: Goals      Patient Stated   . Increase physical activity (pt-stated)       This is a list of the screening recommended for you and due dates:  Health Maintenance  Topic Date Due  . Mammogram  05/06/2020  . Tetanus Vaccine  12/16/2020  . Colon Cancer Screening  07/19/2023  . Flu Shot  Completed  . DEXA scan (bone density measurement)  Completed  .  Hepatitis C: One time screening is recommended by Center for Disease Control  (CDC) for  adults born from [birth year] through 1965.   Completed  . Pneumonia vaccines  Completed

## 2019-05-15 NOTE — Progress Notes (Signed)
Subjective:  Patient ID: Katherine Hudson, female    DOB: 1946/03/12  Age: 72 y.o. MRN: [id number]  CC: Diagnoses of Subclinical hypothyroidism, Mixed hyperlipidemia, Prediabetes, Need for immunization against influenza, Essential hypertension, Major depressive disorder with single episode, in partial remission ([place]), and Sinus bradycardia were pertinent to this visit.  HPI Katherine Hudson presents for follow up on multiple issues  1) Hypertension: patient checks blood pressure twice weekly at home.  Takes telmisartan aT 11 AM/   Readings OVER THE WEEKEND were  VERY LOW and pulse was also low (37, 44) for unclear reasons  (she denies diarrhea,  Vomiting or other dehdration episode )  ( she states it was < [id number] systolic) and acc'd by nausea , dizziness  . Patient is following a reduce salt diet most days and is taking medications as prescribed  2) atrial flutter: since 2018 . Unsuccessful cardioverson last year,  Successful on  sept 8   And in sinus rhythm  During  Follow up with Dr Caryl Comes .  Reviewed  His notes regarding prior  trials of meds including flecainide  And veramil per sept 24 notes  .   Outpatient Medications Prior to Visit  Medication Sig Dispense Refill  . cetirizine (ZYRTEC) 10 MG tablet Take 10 mg by mouth daily as needed for allergies.    . Cholecalciferol (VITAMIN D3) 1000 units CAPS Take 1,000 Units by mouth daily.    Marland Kitchen CINNAMON PO Take 2,000 mg by mouth daily.    . Coenzyme Q10 (CO Q-10) 100 MG CAPS Take 100 mg by mouth daily.    . Cranberry 200 MG CAPS Take 400 mg by mouth daily.     . diphenhydrAMINE (BENADRYL) 2 % cream Apply 1 application topically as needed (bug bites).    . ezetimibe (ZETIA) 10 MG tablet Take 1 tablet (10 mg total) by mouth daily. 90 tablet 0  . flecainide (TAMBOCOR) 100 MG tablet Take 1 tablet (100 mg) by mouth twice daily 180 tablet 3  . fluticasone (FLONASE) 50 MCG/ACT nasal spray Place 2 sprays at bedtime into both nostrils.    . Melatonin 10  MG TABS Take 10 mg by mouth at bedtime as needed (sleep).    . mirabegron ER (MYRBETRIQ) 50 MG TB24 tablet Take 1 tablet (50 mg total) by mouth daily. 90 tablet 1  . Multiple Vitamin (MULTIVITAMIN) tablet Take 1 tablet by mouth daily.      . Naphazoline HCl (CLEAR EYES OP) Apply 1 drop to eye daily as needed (irritation).    . Probiotic Product (PROBIOTIC PO) Take 1 tablet by mouth daily.    . Psyllium (METAMUCIL FIBER PO) Take by mouth.    . sertraline (ZOLOFT) 50 MG tablet TAKE 3 TABLETS BY MOUTH  DAILY 270 tablet 3  . sodium chloride (OCEAN) 0.65 % SOLN nasal spray Place 1 spray into both nostrils as needed for congestion.    Marland Kitchen telmisartan (MICARDIS) 80 MG tablet Take 1 tablet (80 mg total) by mouth daily. 90 tablet 1  . TURMERIC PO Take 1 capsule by mouth daily.    Alveda Reasons 20 MG TABS tablet TAKE 1 TABLET BY MOUTH ONCE DAILY WITH SUPPER 90 tablet 3  . pramoxine (PROCTOFOAM) 1 % foam Place 1 application rectally 3 (three) times daily as needed for itching. (Patient not taking: Reported on 05/15/2019) 15 g 0  . Krill Oil 500 MG CAPS Take 500 mg by mouth daily.    . verapamil (CALAN-SR) 180  MG CR tablet Take 1 tablet (180 mg) by mouth once daily (Patient not taking: Reported on 05/15/2019) 90 tablet 3   No facility-administered medications prior to visit.     Review of Systems;  Patient denies headache, fevers, malaise, unintentional weight loss, skin rash, eye pain, sinus congestion and sinus pain, sore throat, dysphagia,  hemoptysis , cough, dyspnea, wheezing, chest pain, palpitations, orthopnea, edema, abdominal pain, nausea, melena, diarrhea, constipation, flank pain, dysuria, hematuria, urinary  Frequency, nocturia, numbness, tingling, seizures,  Focal weakness, Loss of consciousness,  Tremor, insomnia, depression, anxiety, and suicidal ideation.      Objective:  BP (!) 160/80 (BP Location: Left Arm, Patient Position: Sitting, Cuff Size: Normal)   Pulse (!) 50   Temp (!) 97 F (36.1  C) (Temporal)   Resp 15   Ht 5\' 6"  (1.676 m)   Wt 173 lb 6.4 oz (78.7 kg)   SpO2 97%   BMI 27.99 kg/m   BP Readings from Last 3 Encounters:  05/15/19 (!) 160/80  05/01/19 132/60  04/15/19 100/66    Wt Readings from Last 3 Encounters:  05/15/19 173 lb 6.4 oz (78.7 kg)  05/01/19 176 lb (79.8 kg)  04/15/19 169 lb 8 oz (76.9 kg)    General appearance: alert, cooperative and appears stated age Ears: normal TM's and external ear canals both ears Throat: lips, mucosa, and tongue normal; teeth and gums normal Neck: no adenopathy, no carotid bruit, supple, symmetrical, trachea midline and thyroid not enlarged, symmetric, no tenderness/mass/nodules Back: symmetric, no curvature. ROM normal. No CVA tenderness. Lungs: clear to auscultation bilaterally Heart: regular rate and rhythm, S1, S2 normal, no murmur, click, rub or gallop Abdomen: soft, non-tender; bowel sounds normal; no masses,  no organomegaly Pulses: 2+ and symmetric Skin: Skin color, texture, turgor normal. No rashes or lesions Lymph nodes: Cervical, supraclavicular, and axillary nodes normal.  Lab Results  Component Value Date   HGBA1C 6.0 05/15/2019   HGBA1C 5.9 12/20/2018   HGBA1C 5.9 01/15/2018    Lab Results  Component Value Date   CREATININE 0.89 05/15/2019   CREATININE 0.94 04/10/2019   CREATININE 0.83 12/20/2018    Lab Results  Component Value Date   WBC 6.9 04/10/2019   HGB 15.7 04/10/2019   HCT 47.9 (H) 04/10/2019   PLT 172 04/10/2019   GLUCOSE 105 (H) 05/15/2019   CHOL 237 (H) 05/15/2019   TRIG 182.0 (H) 05/15/2019   HDL 40.80 05/15/2019   LDLDIRECT 147.0 01/15/2018   LDLCALC 160 (H) 05/15/2019   ALT 16 05/15/2019   AST 17 05/15/2019   NA 140 05/15/2019   K 4.0 05/15/2019   CL 106 05/15/2019   CREATININE 0.89 05/15/2019   BUN 23 05/15/2019   CO2 26 05/15/2019   TSH 2.07 05/15/2019   HGBA1C 6.0 05/15/2019   MICROALBUR 1.4 12/20/2018    No results found.  Assessment & Plan:    Problem List Items Addressed This Visit      Unprioritized   Mixed hyperlipidemia    Hstorically Untreated due to persistent myalgias while taking simvastatin. Her 10 yr risk of events using FRC is 20%  Improved with  trial of Zetia   Lab Results  Component Value Date   CHOL 237 (H) 05/15/2019   HDL 40.80 05/15/2019   LDLCALC 160 (H) 05/15/2019   LDLDIRECT 147.0 01/15/2018   TRIG 182.0 (H) 05/15/2019   CHOLHDL 6 05/15/2019   Lab Results  Component Value Date   ALT 16 05/15/2019   AST  17 05/15/2019   ALKPHOS 54 05/15/2019   BILITOT 1.0 05/15/2019           Relevant Medications   verapamil (CALAN-SR) 120 MG CR tablet   amLODipine (NORVASC) 2.5 MG tablet   Essential hypertension    Changing telmisartan to 80 mg at bedtime .  Adding amlodipine and reducing verapamil dose.       Relevant Medications   verapamil (CALAN-SR) 120 MG CR tablet   amLODipine (NORVASC) 2.5 MG tablet   Major depressive disorder with single episode, in partial remission (HCC)    Symptoms are currentlycontrolled with sertraline      Hypothyroidism due to acquired atrophy of thyroid    Thyroid function is currently normal/  Lab Results  Component Value Date   TSH 2.07 05/15/2019         Prediabetes    Her A1c remains suggestive  Of Pre prediabetes .  I recommend she follow a low glycemic index diet and particpate regularly in an aerobic  exercise activity.  We should check an A1c in 6 months.   Lab Results  Component Value Date   HGBA1C 6.0 05/15/2019        Sinus bradycardia    Reducing verapamil dose to 120 mg daily       Relevant Medications   verapamil (CALAN-SR) 120 MG CR tablet   amLODipine (NORVASC) 2.5 MG tablet    Other Visit Diagnoses    Need for immunization against influenza       Relevant Orders   Flu Vaccine QUAD High Dose(Fluad) (Completed)      I have discontinued Samrawit Vandenheuvel. Pelzel's Krill Oil and verapamil. I am also having her start on verapamil and  amLODipine. Additionally, I am having her maintain her multivitamin, TURMERIC PO, Probiotic Product (PROBIOTIC PO), CINNAMON PO, Cranberry, pramoxine, Vitamin D3, Co Q-10, Melatonin, cetirizine, sodium chloride, Naphazoline HCl (CLEAR EYES OP), diphenhydrAMINE, fluticasone, Psyllium (METAMUCIL FIBER PO), mirabegron ER, ezetimibe, sertraline, Xarelto, telmisartan, and flecainide.  Meds ordered this encounter  Medications  . verapamil (CALAN-SR) 120 MG CR tablet    Sig: Take 1 tablet (120 mg total) by mouth at bedtime.    Dispense:  90 tablet    Refill:  1  . amLODipine (NORVASC) 2.5 MG tablet    Sig: Take 1 tablet (2.5 mg total) by mouth daily.    Dispense:  90 tablet    Refill:  3    Medications Discontinued During This Encounter  Medication Reason  . Krill Oil 500 MG CAPS Patient has not taken in last 30 days  . verapamil (CALAN-SR) 180 MG CR tablet     Follow-up: No follow-ups on file.   Crecencio Mc, MD

## 2019-05-15 NOTE — Patient Instructions (Addendum)
Change telmisartan to evening 80 mg dose  I am Reducing your verapamil dose to 120 m g daily . The prescription was sent via  Mail order   You will also Start amlodipine  Once daily in the morning  For blood pressure  (sent to mail order)  when you lower the dose of verapamil  Suspend the amlodipine if  Your blood pressure drops to  110/70 or less

## 2019-05-15 NOTE — Progress Notes (Addendum)
Subjective:   Katherine Hudson is a 73 y.o. female who presents for Medicare Annual (Subsequent) preventive examination.  Review of Systems:  No ROS.  Medicare Wellness Virtual Visit.  Visual/audio telehealth visit, UTA vital signs.   See social history for additional risk factors.   Cardiac Risk Factors include: advanced age (>23men, >24 women);hypertension     Objective:     Vitals: There were no vitals taken for this visit.  There is no height or weight on file to calculate BMI.  Advanced Directives 05/15/2019 04/15/2019 05/10/2018 06/08/2017 02/02/2017  Does Patient Have a Medical Advance Directive? Yes Yes Yes Yes Yes  Type of Paramedic of [place];Living will [place];Living will [place];Living will [place];Living will Living will;Healthcare Power of Attorney  Does patient want to make changes to medical advance directive? No - Patient declined - No - Patient declined No - Patient declined No - Patient declined  Copy of [place] in Chart? No - copy requested No - copy requested No - copy requested No - copy requested Yes    Tobacco Social History   Tobacco Use  Smoking Status Former Smoker  . Years: 10.00  Smokeless Tobacco Never Used  Tobacco Comment   quit 1980     Counseling given: Not Answered Comment: quit 1980   Clinical Intake:  Pre-visit preparation completed: Yes        Diabetes: No  How often do you need to have someone help you when you read instructions, pamphlets, or other written materials from your doctor or pharmacy?: 1 - Never  Interpreter Needed?: No     Past Medical History:  Diagnosis Date  . Depression   . History of echocardiogram    a. 01/2017 Echo: EF 60-65%, no rwma, mild MR, mildly to mod dil LA. nl RV fxn.  . Hyperlipidemia   . Hypertension   . Left atrial flutter by electrocardiogram [facility])    a. demonstrated by Dr  Lovena Le on EPS 6/18, not ablated.  . Paroxysmal atrial fibrillation (HCC)    a. CHA2DS2VASc = 3-->Xarelto.  . Typical atrial flutter [facility])    s/p CTI ablation by Dr Lovena Le 6/18   Past Surgical History:  Procedure Laterality Date  . A-FLUTTER ABLATION N/A 02/02/2017   CTI ablation by Dr Lovena Le for typical atrial flutter.  Left atrial flutter also observed and cardioverted  . APPENDECTOMY    . CARDIOVERSION N/A 04/15/2019   Procedure: CARDIOVERSION;  Surgeon: Nelva Bush, MD;  Location: [facility] ORS;  Service: Cardiovascular;  Laterality: N/A;  . KNEE SURGERY  2000   right  . ROTATOR CUFF REPAIR  1998   right   Family History  Problem Relation Age of Onset  . Cancer Father        lung  . Cancer Mother        cervical  . Stroke Sister    Social History   Socioeconomic History  . Marital status: Married    Spouse name: Not on file  . Number of children: Not on file  . Years of education: Not on file  . Highest education level: Not on file  Occupational History  . Not on file  Social Needs  . Financial resource strain: Not hard at all  . Food insecurity    Worry: Never true    Inability: Never true  . Transportation needs    Medical: No    Non-medical: No  Tobacco Use  . Smoking status: Former Smoker    Years: 10.00  . Smokeless tobacco: Never Used  . Tobacco comment: quit 1980  Substance and Sexual Activity  . Alcohol use: Yes    Comment: occassionally  . Drug use: No  . Sexual activity: Not on file  Lifestyle  . Physical activity    Days per week: 0 days    Minutes per session: Not on file  . Stress: Not at all  Relationships  . Social Herbalist on phone: Not on file    Gets together: Not on file    Attends religious service: Not on file    Active member of club or organization: Not on file    Attends meetings of clubs or organizations: Not on file    Relationship status: Not on file  Other Topics Concern  . Not on file  Social History  Narrative   Lives in [place]    Outpatient Encounter Medications as of 05/15/2019  Medication Sig  . amLODipine (NORVASC) 2.5 MG tablet Take 1 tablet (2.5 mg total) by mouth daily.  . cetirizine (ZYRTEC) 10 MG tablet Take 10 mg by mouth daily as needed for allergies.  . Cholecalciferol (VITAMIN D3) 1000 units CAPS Take 1,000 Units by mouth daily.  Marland Kitchen CINNAMON PO Take 2,000 mg by mouth daily.  . Coenzyme Q10 (CO Q-10) 100 MG CAPS Take 100 mg by mouth daily.  . Cranberry 200 MG CAPS Take 400 mg by mouth daily.   . diphenhydrAMINE (BENADRYL) 2 % cream Apply 1 application topically as needed (bug bites).  . ezetimibe (ZETIA) 10 MG tablet Take 1 tablet (10 mg total) by mouth daily.  . flecainide (TAMBOCOR) 100 MG tablet Take 1 tablet (100 mg) by mouth twice daily  . fluticasone (FLONASE) 50 MCG/ACT nasal spray Place 2 sprays at bedtime into both nostrils.  . Melatonin 10 MG TABS Take 10 mg by mouth at bedtime as needed (sleep).  . mirabegron ER (MYRBETRIQ) 50 MG TB24 tablet Take 1 tablet (50 mg total) by mouth daily.  . Multiple Vitamin (MULTIVITAMIN) tablet Take 1 tablet by mouth daily.    . Naphazoline HCl (CLEAR EYES OP) Apply 1 drop to eye daily as needed (irritation).  . pramoxine (PROCTOFOAM) 1 % foam Place 1 application rectally 3 (three) times daily as needed for itching. (Patient not taking: Reported on 05/15/2019)  . Probiotic Product (PROBIOTIC PO) Take 1 tablet by mouth daily.  . Psyllium (METAMUCIL FIBER PO) Take by mouth.  . sertraline (ZOLOFT) 50 MG tablet TAKE 3 TABLETS BY MOUTH  DAILY  . sodium chloride (OCEAN) 0.65 % SOLN nasal spray Place 1 spray into both nostrils as needed for congestion.  Marland Kitchen telmisartan (MICARDIS) 80 MG tablet Take 1 tablet (80 mg total) by mouth daily.  . TURMERIC PO Take 1 capsule by mouth daily.  . verapamil (CALAN-SR) 120 MG CR tablet Take 1 tablet (120 mg total) by mouth at bedtime.  Alveda Reasons 20 MG TABS tablet TAKE 1 TABLET BY MOUTH ONCE DAILY WITH  SUPPER   No facility-administered encounter medications on file as of 05/15/2019.     Activities of Daily Living In your present state of health, do you have any difficulty performing the following activities: 05/15/2019 04/15/2019  Hearing? N N  Vision? N N  Difficulty concentrating or making decisions? N N  Walking or climbing stairs? N N  Dressing or bathing? N N  Doing errands, shopping? N -  Preparing Food and eating ? N -  Using the Toilet? N -  In the past six months, have you accidently leaked urine? Y -  Comment Followed by pcp -  Do you have problems with loss of bowel control? N -  Managing your Medications? N -  Managing your Finances? N -  Housekeeping or managing your Housekeeping? N -  Some recent data might be hidden    Patient Care Team: Crecencio Mc, MD as PCP - General (Internal Medicine) Deboraha Sprang, MD as PCP - Cardiology (Cardiology)    Assessment:   This is a routine wellness examination for Tasnim.  I connected with patient 05/15/19 at 12:30 PM EDT by an audio enabled telemedicine application and verified that I am speaking with the correct person using two identifiers. Patient stated full name and DOB. Patient gave permission to continue with virtual visit. Patient's location was at home and Nurse's location was at [place] office.   Health Maintenance Due: Update all pending maintenance due as appropriate.   See completed HM at the end of note.   Eye: Visual acuity not assessed. Virtual visit. Wears corrective lenses. Followed by their ophthalmologist. She plans to schedule cataract extraction.   Dental: UTD  Hearing: Demonstrates normal hearing during visit.  Safety:  Patient feels safe at home- yes Patient does have smoke detectors at home- yes Patient does wear sunscreen or protective clothing when in direct sunlight - yes Patient does wear seat belt when in a moving vehicle - yes Patient drives- yes Adequate lighting in walkways free  from debris- yes Grab bars and handrails used as appropriate- yes Ambulates with no assistive device  Social: Alcohol intake - yes      Smoking history- former    Smokers in home? none Illicit drug use? none  Depression: PHQ 2 &9 complete. See screening below. Denies irritability, anhedonia, sadness/tearfullness.  Stable.   Falls: See screening below.    Medication: Taking as directed and without issues.   Covid-19: Precautions and sickness symptoms discussed. Wears mask, social distancing, hand hygiene as appropriate.   Activities of Daily Living Patient denies needing assistance with: household chores, feeding themselves, getting from bed to chair, getting to the toilet, bathing/showering, dressing, managing money, or preparing meals.   Memory: Patient is alert. MMSE declined. States she likes to read and complete crossword puzzles for brain stimulation.   BMI- discussed the importance of a healthy diet, water intake and the benefits of aerobic exercise.  Educational material provided.  Physical activity- walking as tolerated.  Diet: Regular Water: good intake  Other Providers Patient Care Team: Crecencio Mc, MD as PCP - General (Internal Medicine) Deboraha Sprang, MD as PCP - Cardiology (Cardiology)  Exercise Activities and Dietary recommendations Current Exercise Habits: The patient does not participate in regular exercise at present  Goals      Patient Stated   . Increase physical activity (pt-stated)       Fall Risk Fall Risk  05/15/2019 05/10/2018 06/08/2017 12/11/2016 09/20/2014  Falls in the past year? 1 No Yes Yes Yes  Number falls in past yr: 1 - 1 2 or more 1  Injury with Fall? 0 - Yes Yes Yes  Comment - - She sought medical attention for hurt ribs - -  Risk Factor Category  - - High Fall Risk High Fall Risk -  Risk for fall due to : History of fall(s) - History of fall(s) - History of fall(s)  Follow  up Falls evaluation completed - Education  provided;Falls prevention discussed - Falls prevention discussed   Timed Get Up and Go performed: no, virtual visit  Depression Screen PHQ 2/9 Scores 05/15/2019 05/10/2018 06/08/2017 12/11/2016  PHQ - 2 Score 3 0 0 0  PHQ- 9 Score 10 - - -  Exception Documentation - - - Patient refusal     Cognitive Function MMSE - Mini Mental State Exam 05/10/2018 06/08/2017  Orientation to time 5 5  Orientation to Place 5 5  Registration 3 3  Attention/ Calculation 5 5  Recall 3 3  Language- name 2 objects 2 2  Language- repeat 1 1  Language- follow 3 step command 3 3  Language- read & follow direction 1 1  Write a sentence 1 1  Copy design 1 1  Total score 30 30        Immunization History  Administered Date(s) Administered  . Fluad Quad(high Dose 65+) 05/15/2019  . Influenza Split 05/30/2014  . Influenza Whole 07/13/2011  . Influenza, High Dose Seasonal PF 05/26/2015, 06/08/2017, 05/10/2018  . Influenza,inj,Quad PF,6+ Mos 06/02/2013  . Influenza-Unspecified 04/07/2012  . Pneumococcal Conjugate-13 06/02/2013, 01/08/2018  . Pneumococcal Polysaccharide-23 05/26/2015  . Tdap 12/17/2010  . Zoster 05/30/2014   Screening Tests Health Maintenance  Topic Date Due  . MAMMOGRAM  05/06/2020  . TETANUS/TDAP  12/16/2020  . COLONOSCOPY  07/19/2023  . INFLUENZA VACCINE  Completed  . DEXA SCAN  Completed  . Hepatitis C Screening  Completed  . PNA vac Low Risk Adult  Completed      Plan:   Keep all routine maintenance appointments.   Medicare Attestation I have personally reviewed: The patient's medical and social history Their use of alcohol, tobacco or illicit drugs Their current medications and supplements The patient's functional ability including ADLs,fall risks, home safety risks, cognitive, and hearing and visual impairment Diet and physical activities Evidence for depression   In addition, I have reviewed and discussed with patient certain preventive protocols, quality metrics,  and best practice recommendations. A written personalized care plan for preventive services as well as general preventive health recommendations were provided to patient via mail.     OBrien-Blaney, Aastha Dayley L, LPN  [phone]    I have reviewed the above information and agree with above.   Deborra Medina, MD

## 2019-05-17 NOTE — Assessment & Plan Note (Addendum)
Thyroid function is currently normal/  Lab Results  Component Value Date   TSH 2.07 05/15/2019

## 2019-05-17 NOTE — Assessment & Plan Note (Signed)
Changing telmisartan to 80 mg at bedtime .  Adding amlodipine and reducing verapamil dose.

## 2019-05-17 NOTE — Assessment & Plan Note (Signed)
Symptoms are currentlycontrolled with sertraline

## 2019-05-18 NOTE — Assessment & Plan Note (Signed)
Reducing verapamil dose to 120 mg daily

## 2019-05-18 NOTE — Assessment & Plan Note (Signed)
Hstorically Untreated due to persistent myalgias while taking simvastatin. Her 10 yr risk of events using FRC is 20%  Improved with  trial of Zetia   Lab Results  Component Value Date   CHOL 237 (H) 05/15/2019   HDL 40.80 05/15/2019   LDLCALC 160 (H) 05/15/2019   LDLDIRECT 147.0 01/15/2018   TRIG 182.0 (H) 05/15/2019   CHOLHDL 6 05/15/2019   Lab Results  Component Value Date   ALT 16 05/15/2019   AST 17 05/15/2019   ALKPHOS 54 05/15/2019   BILITOT 1.0 05/15/2019

## 2019-05-18 NOTE — Assessment & Plan Note (Signed)
Her A1c remains suggestive  Of Pre prediabetes .  I recommend she follow a low glycemic index diet and particpate regularly in an aerobic  exercise activity.  We should check an A1c in 6 months.   Lab Results  Component Value Date   HGBA1C 6.0 05/15/2019

## 2019-05-23 ENCOUNTER — Other Ambulatory Visit: Payer: Self-pay | Admitting: Internal Medicine

## 2019-05-23 DIAGNOSIS — E782 Mixed hyperlipidemia: Secondary | ICD-10-CM

## 2019-06-14 ENCOUNTER — Other Ambulatory Visit: Payer: Self-pay | Admitting: Internal Medicine

## 2019-06-16 ENCOUNTER — Telehealth: Payer: Self-pay | Admitting: *Deleted

## 2019-06-16 ENCOUNTER — Other Ambulatory Visit: Payer: Self-pay | Admitting: Internal Medicine

## 2019-06-16 NOTE — Telephone Encounter (Signed)
-----   Message from Crecencio Mc, MD sent at 06/14/2019 11:23 PM EST ----- Regarding: RN visit needed Patient having low blood pressures at home. My reponse to her is below.  She needs rn visit this week to check bp   Agree..  the next one to stop is the telmisartan if your pressure doesn't respond by Sunday .  I would like you to have an RN visit next week to have your BP checked.  please bring your home blood pressure machine with you.   Regards,   Deborra Medina, MD

## 2019-06-16 NOTE — Telephone Encounter (Signed)
I left a message for the patient to return my call. To schedule Nurse visit for BP check per PCP order.

## 2019-06-19 NOTE — Telephone Encounter (Signed)
Patient scheduled BP check.

## 2019-06-24 ENCOUNTER — Ambulatory Visit (INDEPENDENT_AMBULATORY_CARE_PROVIDER_SITE_OTHER): Payer: Medicare Other

## 2019-06-24 ENCOUNTER — Other Ambulatory Visit: Payer: Self-pay

## 2019-06-24 VITALS — BP 145/76 | HR 60

## 2019-06-24 DIAGNOSIS — I10 Essential (primary) hypertension: Secondary | ICD-10-CM | POA: Diagnosis not present

## 2019-06-24 NOTE — Progress Notes (Addendum)
Patient here for nurse visit BP check per order from Dr. Derrel Nip Patient reports compliance with prescribed BP medications: yes  Last dose of BP medication: last night   BP Readings from Last 3 Encounters:  06/24/19 140/70  05/15/19 (!) 160/80  05/01/19 132/60   Pulse Readings from Last 3 Encounters:  06/24/19 (!) 56  05/15/19 (!) 50  05/01/19 (!) 49    Per Dr. Nicki Reaper  Patient was informed to keep the current BP regimen.   BP readings were giving to [place]. BP was taken with the pateints wrist BP cuff from home and [employer] BP cuff.    Patient verbalized understanding of instructions.   Gordy Councilman, CMA   Received blood pressures to review after pt left office.  She presented for blood pressure check secondary to concern regarding low blood pressure checks at home.  Blood pressure here - 140/70.  Blood pressure readings to Dr Derrel Nip for review.  Will hold on making adjustments in her medication.  Dr Derrel Nip to reviewe and further treatment pending her assessment.    Dr Nicki Reaper

## 2019-06-24 NOTE — Progress Notes (Signed)
Patient here for nurse visit BP check per order from Dr. Derrel Nip   Patient reports compliance with prescribed BP medications: yes  Last dose of BP medication:  Last Night   BP Readings from Last 3 Encounters:  06/24/19 140/70  05/15/19 (!) 160/80  05/01/19 132/60   Pulse Readings from Last 3 Encounters:  06/24/19 (!) 56  05/15/19 (!) 50  05/01/19 (!) 49    Per Dr. Derrel Nip   Patient was informed to keep her current regimen, BP readings were giving to [place]. BP was taken with the pateints wrist BP cuff from home and [employer] BP cuff.    Patient verbalized understanding of instructions.   Gordy Councilman, CMA

## 2019-07-07 ENCOUNTER — Telehealth: Payer: Self-pay | Admitting: Internal Medicine

## 2019-07-07 DIAGNOSIS — I10 Essential (primary) hypertension: Secondary | ICD-10-CM

## 2019-07-07 NOTE — Assessment & Plan Note (Signed)
Home readings for November were excellent. No changes to regimen advised

## 2019-07-17 ENCOUNTER — Telehealth: Payer: Self-pay | Admitting: Internal Medicine

## 2019-07-17 NOTE — Telephone Encounter (Signed)
Pt c/o Shortness Of Breath: STAT if SOB developed within the last 24 hours or pt is noticeably SOB on the phone  1. Are you currently SOB (can you hear that pt is SOB on the phone)? yes  2. How long have you been experiencing SOB? 4 days  3. Are you SOB when sitting or when up moving around? Both , even whey lying in bed  4. Are you currently experiencing any other symptoms? Weakness, cant walk very far, dizziness.  Pt thinks her BP is low

## 2019-07-17 NOTE — Telephone Encounter (Signed)
Attempted to call the patient. No answer- I left a message to please call back.  

## 2019-07-18 NOTE — Telephone Encounter (Signed)
I attempted to call the patient back on her cell # [phone]. No answer- I left a detailed message on her identified voice mail to please call back.

## 2019-07-18 NOTE — Telephone Encounter (Signed)
Patient calling to let heather know she changed her medications around and is no longer having issues.  She says she will call back on Monday if she does have any issues.

## 2019-07-18 NOTE — Telephone Encounter (Signed)
I spoke with the patient. She states for ~ 4 days her HR was running 44 bpm. She was feeling winded during that time.  She states she is unsure if she was taking her medication wrong or what was happening, but today her HR has been back up to 59 bpm and she is not winded.   Confirmed she is taking: Flecainide 100 mg BID Verapamil 120 mg QD Xarelto 20 mg QD  I have that she call us back on Monday if she has more issues with her HR running low over the weekend and feeling winded as she may need to come in for further evaluation.  The patient voices understanding of the above and is agreeable.

## 2019-07-18 NOTE — Telephone Encounter (Signed)
Patient returning call. Please call cell [phone]

## 2019-09-08 DIAGNOSIS — H2513 Age-related nuclear cataract, bilateral: Secondary | ICD-10-CM | POA: Diagnosis not present

## 2019-09-17 DIAGNOSIS — H2511 Age-related nuclear cataract, right eye: Secondary | ICD-10-CM | POA: Diagnosis not present

## 2019-09-17 DIAGNOSIS — I10 Essential (primary) hypertension: Secondary | ICD-10-CM | POA: Diagnosis not present

## 2019-09-25 ENCOUNTER — Other Ambulatory Visit: Payer: Self-pay

## 2019-09-25 ENCOUNTER — Encounter: Payer: Self-pay | Admitting: Ophthalmology

## 2019-09-26 NOTE — Discharge Instructions (Signed)

## 2019-09-29 ENCOUNTER — Other Ambulatory Visit
Admission: RE | Admit: 2019-09-29 | Discharge: 2019-09-29 | Disposition: A | Discharge status: Home / Self Care | Payer: Medicare Other | Source: Ambulatory Visit | Attending: Ophthalmology | Admitting: Ophthalmology

## 2019-09-29 DIAGNOSIS — Z01812 Encounter for preprocedural laboratory examination: Secondary | ICD-10-CM | POA: Insufficient documentation

## 2019-09-29 DIAGNOSIS — Z20822 Contact with and (suspected) exposure to covid-19: Secondary | ICD-10-CM | POA: Diagnosis not present

## 2019-09-30 LAB — SARS CORONAVIRUS 2 (TAT 6-24 HRS): SARS Coronavirus 2: NEGATIVE

## 2019-10-01 ENCOUNTER — Encounter: Payer: Self-pay | Admitting: Ophthalmology

## 2019-10-01 ENCOUNTER — Ambulatory Visit: Payer: Medicare Other | Admitting: Anesthesiology

## 2019-10-01 ENCOUNTER — Encounter
Admission: RE | Disposition: A | Discharge status: Home / Self Care | Payer: Self-pay | Source: Home / Self Care | Attending: Ophthalmology

## 2019-10-01 ENCOUNTER — Ambulatory Visit
Admission: RE | Admit: 2019-10-01 | Discharge: 2019-10-01 | Disposition: A | Discharge status: Home / Self Care | Payer: Medicare Other | Attending: Ophthalmology | Admitting: Ophthalmology

## 2019-10-01 ENCOUNTER — Other Ambulatory Visit: Payer: Self-pay

## 2019-10-01 DIAGNOSIS — M1990 Unspecified osteoarthritis, unspecified site: Secondary | ICD-10-CM | POA: Diagnosis not present

## 2019-10-01 DIAGNOSIS — Z885 Allergy status to narcotic agent status: Secondary | ICD-10-CM | POA: Insufficient documentation

## 2019-10-01 DIAGNOSIS — Z87891 Personal history of nicotine dependence: Secondary | ICD-10-CM | POA: Insufficient documentation

## 2019-10-01 DIAGNOSIS — E7800 Pure hypercholesterolemia, unspecified: Secondary | ICD-10-CM | POA: Diagnosis not present

## 2019-10-01 DIAGNOSIS — F329 Major depressive disorder, single episode, unspecified: Secondary | ICD-10-CM | POA: Insufficient documentation

## 2019-10-01 DIAGNOSIS — H2511 Age-related nuclear cataract, right eye: Secondary | ICD-10-CM | POA: Insufficient documentation

## 2019-10-01 DIAGNOSIS — I499 Cardiac arrhythmia, unspecified: Secondary | ICD-10-CM | POA: Diagnosis not present

## 2019-10-01 DIAGNOSIS — H9190 Unspecified hearing loss, unspecified ear: Secondary | ICD-10-CM | POA: Insufficient documentation

## 2019-10-01 DIAGNOSIS — H25811 Combined forms of age-related cataract, right eye: Secondary | ICD-10-CM | POA: Diagnosis not present

## 2019-10-01 DIAGNOSIS — I10 Essential (primary) hypertension: Secondary | ICD-10-CM | POA: Diagnosis not present

## 2019-10-01 HISTORY — PX: CATARACT EXTRACTION W/PHACO: SHX586

## 2019-10-01 SURGERY — PHACOEMULSIFICATION, CATARACT, WITH IOL INSERTION
Anesthesia: Monitor Anesthesia Care | Site: Eye | Laterality: Right

## 2019-10-01 MED ORDER — EPINEPHRINE PF 1 MG/ML IJ SOLN
INTRAOCULAR | Status: DC | PRN
  Administered 2019-10-01: 10:00:00 71 mL via OPHTHALMIC

## 2019-10-01 MED ORDER — MOXIFLOXACIN HCL 0.5 % OP SOLN
OPHTHALMIC | Status: DC | PRN
  Administered 2019-10-01: 0.2 mL via OPHTHALMIC

## 2019-10-01 MED ORDER — LIDOCAINE HCL (PF) 2 % IJ SOLN
INTRAOCULAR | Status: DC | PRN
  Administered 2019-10-01: 2 mL

## 2019-10-01 MED ORDER — FENTANYL CITRATE (PF) 100 MCG/2ML IJ SOLN
INTRAMUSCULAR | Status: DC | PRN
  Administered 2019-10-01 (×2): 50 ug via INTRAVENOUS

## 2019-10-01 MED ORDER — MOXIFLOXACIN HCL 0.5 % OP SOLN
1.0000 [drp] | OPHTHALMIC | Status: DC | PRN
  Administered 2019-10-01 (×3): 1 [drp] via OPHTHALMIC

## 2019-10-01 MED ORDER — ARMC OPHTHALMIC DILATING DROPS
1.0000 "application " | OPHTHALMIC | Status: DC | PRN
  Administered 2019-10-01 (×3): 1 via OPHTHALMIC

## 2019-10-01 MED ORDER — MIDAZOLAM HCL 2 MG/2ML IJ SOLN
INTRAMUSCULAR | Status: DC | PRN
  Administered 2019-10-01 (×2): 1 mg via INTRAVENOUS

## 2019-10-01 MED ORDER — TETRACAINE HCL 0.5 % OP SOLN
1.0000 [drp] | OPHTHALMIC | Status: DC | PRN
  Administered 2019-10-01 (×3): 1 [drp] via OPHTHALMIC

## 2019-10-01 MED ORDER — NA HYALUR & NA CHOND-NA HYALUR 0.4-0.35 ML IO KIT
PACK | INTRAOCULAR | Status: DC | PRN
  Administered 2019-10-01: 1 mL via INTRAOCULAR

## 2019-10-01 MED ORDER — BRIMONIDINE TARTRATE-TIMOLOL 0.2-0.5 % OP SOLN
OPHTHALMIC | Status: DC | PRN
  Administered 2019-10-01: 1 [drp] via OPHTHALMIC

## 2019-10-01 SURGICAL SUPPLY — 24 items
CANNULA ANT/CHMB 27G (MISCELLANEOUS) ×1 IMPLANT
CANNULA ANT/CHMB 27GA (MISCELLANEOUS) ×3 IMPLANT
GLOVE SURG LX 7.5 STRW (GLOVE) ×2
GLOVE SURG LX STRL 7.5 STRW (GLOVE) ×1 IMPLANT
GLOVE SURG TRIUMPH 8.0 PF LTX (GLOVE) ×3 IMPLANT
GOWN STRL REUS W/ TWL LRG LVL3 (GOWN DISPOSABLE) ×2 IMPLANT
GOWN STRL REUS W/TWL LRG LVL3 (GOWN DISPOSABLE) ×4
LENS IOL DIOP 22.5 (Intraocular Lens) ×3 IMPLANT
LENS IOL TECNIS MONO 22.5 (Intraocular Lens) IMPLANT
MARKER SKIN DUAL TIP RULER LAB (MISCELLANEOUS) ×3 IMPLANT
NDL CAPSULORHEX 25GA (NEEDLE) ×1 IMPLANT
NDL FILTER BLUNT 18X1 1/2 (NEEDLE) ×2 IMPLANT
NEEDLE CAPSULORHEX 25GA (NEEDLE) ×3 IMPLANT
NEEDLE FILTER BLUNT 18X 1/2SAF (NEEDLE) ×4
NEEDLE FILTER BLUNT 18X1 1/2 (NEEDLE) ×2 IMPLANT
PACK CATARACT BRASINGTON (MISCELLANEOUS) ×3 IMPLANT
PACK EYE AFTER SURG (MISCELLANEOUS) ×3 IMPLANT
PACK OPTHALMIC (MISCELLANEOUS) ×3 IMPLANT
SOLUTION OPHTHALMIC SALT (MISCELLANEOUS) ×3 IMPLANT
SYR 3ML LL SCALE MARK (SYRINGE) ×6 IMPLANT
SYR TB 1ML LUER SLIP (SYRINGE) ×3 IMPLANT
TIP ITREPID SGL USE BENT I/A (SUCTIONS) ×2 IMPLANT
WATER STERILE IRR 250ML POUR (IV SOLUTION) ×3 IMPLANT
WIPE NON LINTING 3.25X3.25 (MISCELLANEOUS) ×3 IMPLANT

## 2019-10-01 NOTE — Op Note (Signed)
LOCATION:  [place]   PREOPERATIVE DIAGNOSIS:    Nuclear sclerotic cataract right eye. H25.11   POSTOPERATIVE DIAGNOSIS:  Nuclear sclerotic cataract right eye.     PROCEDURE:  Phacoemusification with posterior chamber intraocular lens placement of the right eye   ULTRASOUND TIME: Procedure(s): CATARACT EXTRACTION PHACO AND INTRAOCULAR LENS PLACEMENT (IOC) RIGHT MALYUGIN 7.52 00:59.3 12.6% (Right)  LENS:   Implant Name Type Inv. Item Serial No. Manufacturer Lot No. LRB No. Used Action  LENS IOL DIOP 22.5 - [id number] Intraocular Lens LENS IOL DIOP 22.5 [id number]  Right 1 Implanted         SURGEON:  Wyonia Hough, MD   ANESTHESIA:  Topical with tetracaine drops and 2% Xylocaine jelly, augmented with 1% preservative-free intracameral lidocaine.    COMPLICATIONS:  None.   DESCRIPTION OF PROCEDURE:  The patient was identified in the holding room and transported to the operating room and placed in the supine position under the operating microscope.  The right eye was identified as the operative eye and it was prepped and draped in the usual sterile ophthalmic fashion.   A 1 millimeter clear-corneal paracentesis was made at the 12:00 position.  0.5 ml of preservative-free 1% lidocaine was injected into the anterior chamber. The anterior chamber was filled with Viscoat viscoelastic.  A 2.4 millimeter keratome was used to make a near-clear corneal incision at the 9:00 position.  A curvilinear capsulorrhexis was made with a cystotome and capsulorrhexis forceps.  Balanced salt solution was used to hydrodissect and hydrodelineate the nucleus.   Phacoemulsification was then used in stop and chop fashion to remove the lens nucleus and epinucleus.  The remaining cortex was then removed using the irrigation and aspiration handpiece. Provisc was then placed into the capsular bag to distend it for lens placement.  A lens was then injected into the capsular bag.  The remaining  viscoelastic was aspirated.   Wounds were hydrated with balanced salt solution.  The anterior chamber was inflated to a physiologic pressure with balanced salt solution.  No wound leaks were noted. Vigamox 0.2 ml of a 1mg  per ml solution was injected into the anterior chamber for a dose of 0.2 mg of intracameral antibiotic at the completion of the case.   Timolol and Brimonidine drops were applied to the eye.  The patient was taken to the recovery room in stable condition without complications of anesthesia or surgery.   Maddex Garlitz 10/01/2019, 9:32 AM

## 2019-10-01 NOTE — Transfer of Care (Signed)
Immediate Anesthesia Transfer of Care Note  Patient: Katherine Hudson  Procedure(s) Performed: CATARACT EXTRACTION PHACO AND INTRAOCULAR LENS PLACEMENT (IOC) RIGHT MALYUGIN 7.52 00:59.3 12.6% (Right Eye)  Patient Location: PACU  Anesthesia Type: MAC  Level of Consciousness: awake, alert  and patient cooperative  Airway and Oxygen Therapy: Patient Spontanous Breathing   Post-op Assessment: Post-op Vital signs reviewed, Patient's Cardiovascular Status Stable, Respiratory Function Stable, Patent Airway and No signs of Nausea or vomiting  Post-op Vital Signs: Reviewed and stable  Complications: No apparent anesthesia complications

## 2019-10-01 NOTE — Anesthesia Postprocedure Evaluation (Signed)
Anesthesia Post Note  Patient: Katherine Hudson  Procedure(s) Performed: CATARACT EXTRACTION PHACO AND INTRAOCULAR LENS PLACEMENT ([place]) RIGHT MALYUGIN 7.52 00:59.3 12.6% (Right Eye)     Anesthesia Post Evaluation  Florrie Ramires

## 2019-10-01 NOTE — H&P (Signed)

## 2019-10-01 NOTE — Anesthesia Preprocedure Evaluation (Signed)
Anesthesia Evaluation  Patient identified by MRN, date of birth, ID band Patient awake    Reviewed: Allergy & Precautions, NPO status , Patient's Chart, lab work & pertinent test results  Airway Mallampati: III  TM Distance: >3 FB Neck ROM: Full    Dental no notable dental hx.    Pulmonary former smoker,    Pulmonary exam normal        Cardiovascular hypertension, Normal cardiovascular exam+ dysrhythmias Atrial Fibrillation      Neuro/Psych PSYCHIATRIC DISORDERS Depression negative neurological ROS     GI/Hepatic negative GI ROS, Neg liver ROS,   Endo/Other  negative endocrine ROS  Renal/GU negative Renal ROS  negative genitourinary   Musculoskeletal  (+) Arthritis , Osteoarthritis,    Abdominal Normal abdominal exam  (+)   Peds negative pediatric ROS (+)  Hematology negative hematology ROS (+)   Anesthesia Other Findings 01/2017 Echo: EF 60-65%, no rwma, mild MR, mildly to mod dil LA. nl RV fxn  Reproductive/Obstetrics                             Anesthesia Physical  Anesthesia Plan  ASA: III  Anesthesia Plan: MAC   Post-op Pain Management:    Induction: Intravenous  PONV Risk Score and Plan: 2 and Treatment may vary due to age or medical condition  Airway Management Planned: Nasal Cannula  Additional Equipment:   Intra-op Plan:   Post-operative Plan:   Informed Consent: I have reviewed the patients History and Physical, chart, labs and discussed the procedure including the risks, benefits and alternatives for the proposed anesthesia with the patient or authorized representative who has indicated his/her understanding and acceptance.       Plan Discussed with: CRNA  Anesthesia Plan Comments:         Anesthesia Quick Evaluation

## 2019-10-01 NOTE — Anesthesia Procedure Notes (Signed)
Procedure Name: MAC Date/Time: 10/01/2019 9:13 AM Performed by: Vanetta Shawl, CRNA Pre-anesthesia Checklist: Patient identified, Emergency Drugs available, Suction available, Timeout performed and Patient being monitored Patient Re-evaluated:Patient Re-evaluated prior to induction Oxygen Delivery Method: Nasal cannula Placement Confirmation: positive ETCO2

## 2019-10-02 ENCOUNTER — Encounter: Payer: Self-pay | Admitting: *Deleted

## 2019-10-09 ENCOUNTER — Other Ambulatory Visit: Payer: Self-pay | Admitting: Internal Medicine

## 2019-10-10 DIAGNOSIS — H2512 Age-related nuclear cataract, left eye: Secondary | ICD-10-CM | POA: Diagnosis not present

## 2019-10-10 DIAGNOSIS — I10 Essential (primary) hypertension: Secondary | ICD-10-CM | POA: Diagnosis not present

## 2019-10-14 ENCOUNTER — Encounter: Payer: Self-pay | Admitting: Ophthalmology

## 2019-10-20 ENCOUNTER — Other Ambulatory Visit
Admission: RE | Admit: 2019-10-20 | Discharge: 2019-10-20 | Disposition: A | Discharge status: Home / Self Care | Payer: Medicare Other | Source: Ambulatory Visit | Attending: Ophthalmology | Admitting: Ophthalmology

## 2019-10-20 DIAGNOSIS — Z01812 Encounter for preprocedural laboratory examination: Secondary | ICD-10-CM | POA: Insufficient documentation

## 2019-10-20 DIAGNOSIS — Z20822 Contact with and (suspected) exposure to covid-19: Secondary | ICD-10-CM | POA: Diagnosis not present

## 2019-10-20 LAB — SARS CORONAVIRUS 2 (TAT 6-24 HRS): SARS Coronavirus 2: NEGATIVE

## 2019-10-20 NOTE — Discharge Instructions (Signed)

## 2019-10-22 ENCOUNTER — Ambulatory Visit
Admission: RE | Admit: 2019-10-22 | Discharge: 2019-10-22 | Disposition: A | Discharge status: Home / Self Care | Payer: Medicare Other | Attending: Ophthalmology | Admitting: Ophthalmology

## 2019-10-22 ENCOUNTER — Ambulatory Visit: Payer: Medicare Other | Admitting: Anesthesiology

## 2019-10-22 ENCOUNTER — Other Ambulatory Visit: Payer: Self-pay

## 2019-10-22 ENCOUNTER — Encounter
Admission: RE | Disposition: A | Discharge status: Home / Self Care | Payer: Self-pay | Source: Home / Self Care | Attending: Ophthalmology

## 2019-10-22 ENCOUNTER — Encounter: Payer: Self-pay | Admitting: Ophthalmology

## 2019-10-22 DIAGNOSIS — Z7901 Long term (current) use of anticoagulants: Secondary | ICD-10-CM | POA: Insufficient documentation

## 2019-10-22 DIAGNOSIS — Z91040 Latex allergy status: Secondary | ICD-10-CM | POA: Diagnosis not present

## 2019-10-22 DIAGNOSIS — M1990 Unspecified osteoarthritis, unspecified site: Secondary | ICD-10-CM | POA: Insufficient documentation

## 2019-10-22 DIAGNOSIS — Z6829 Body mass index (BMI) 29.0-29.9, adult: Secondary | ICD-10-CM | POA: Insufficient documentation

## 2019-10-22 DIAGNOSIS — H2512 Age-related nuclear cataract, left eye: Secondary | ICD-10-CM | POA: Diagnosis not present

## 2019-10-22 DIAGNOSIS — I4891 Unspecified atrial fibrillation: Secondary | ICD-10-CM | POA: Insufficient documentation

## 2019-10-22 DIAGNOSIS — I10 Essential (primary) hypertension: Secondary | ICD-10-CM | POA: Insufficient documentation

## 2019-10-22 DIAGNOSIS — Z87891 Personal history of nicotine dependence: Secondary | ICD-10-CM | POA: Insufficient documentation

## 2019-10-22 DIAGNOSIS — H25812 Combined forms of age-related cataract, left eye: Secondary | ICD-10-CM | POA: Diagnosis not present

## 2019-10-22 DIAGNOSIS — Z79899 Other long term (current) drug therapy: Secondary | ICD-10-CM | POA: Insufficient documentation

## 2019-10-22 HISTORY — PX: CATARACT EXTRACTION W/PHACO: SHX586

## 2019-10-22 SURGERY — PHACOEMULSIFICATION, CATARACT, WITH IOL INSERTION
Anesthesia: Monitor Anesthesia Care | Site: Eye | Laterality: Left

## 2019-10-22 MED ORDER — NA HYALUR & NA CHOND-NA HYALUR 0.4-0.35 ML IO KIT
PACK | INTRAOCULAR | Status: DC | PRN
  Administered 2019-10-22: 1 mL via INTRAOCULAR

## 2019-10-22 MED ORDER — MOXIFLOXACIN HCL 0.5 % OP SOLN
1.0000 [drp] | OPHTHALMIC | Status: DC | PRN
  Administered 2019-10-22 (×3): 1 [drp] via OPHTHALMIC

## 2019-10-22 MED ORDER — MOXIFLOXACIN HCL 0.5 % OP SOLN
OPHTHALMIC | Status: DC | PRN
  Administered 2019-10-22: 0.2 mL via OPHTHALMIC

## 2019-10-22 MED ORDER — TETRACAINE HCL 0.5 % OP SOLN
1.0000 [drp] | OPHTHALMIC | Status: DC | PRN
  Administered 2019-10-22 (×3): 1 [drp] via OPHTHALMIC

## 2019-10-22 MED ORDER — EPINEPHRINE PF 1 MG/ML IJ SOLN
INTRAOCULAR | Status: DC | PRN
  Administered 2019-10-22: 59 mL via OPHTHALMIC

## 2019-10-22 MED ORDER — BRIMONIDINE TARTRATE-TIMOLOL 0.2-0.5 % OP SOLN
OPHTHALMIC | Status: DC | PRN
  Administered 2019-10-22: 1 [drp] via OPHTHALMIC

## 2019-10-22 MED ORDER — ARMC OPHTHALMIC DILATING DROPS
1.0000 "application " | OPHTHALMIC | Status: DC | PRN
  Administered 2019-10-22 (×3): 1 via OPHTHALMIC

## 2019-10-22 MED ORDER — LIDOCAINE HCL (PF) 2 % IJ SOLN
INTRAOCULAR | Status: DC | PRN
  Administered 2019-10-22: 2 mL

## 2019-10-22 MED ORDER — FENTANYL CITRATE (PF) 100 MCG/2ML IJ SOLN
INTRAMUSCULAR | Status: DC | PRN
  Administered 2019-10-22 (×2): 50 ug via INTRAVENOUS

## 2019-10-22 MED ORDER — LACTATED RINGERS IV SOLN: INTRAVENOUS | Status: DC

## 2019-10-22 MED ORDER — MIDAZOLAM HCL 2 MG/2ML IJ SOLN
INTRAMUSCULAR | Status: DC | PRN
  Administered 2019-10-22: 2 mg via INTRAVENOUS

## 2019-10-22 SURGICAL SUPPLY — 28 items
CANNULA ANT/CHMB 27G (MISCELLANEOUS) ×1 IMPLANT
CANNULA ANT/CHMB 27GA (MISCELLANEOUS) ×3 IMPLANT
GLOVE PI ULTRA LF STRL 7.5 (GLOVE) IMPLANT
GLOVE PI ULTRA NON LATEX 7.5 (GLOVE) ×6
GOWN STRL REUS W/ TWL LRG LVL3 (GOWN DISPOSABLE) ×2 IMPLANT
GOWN STRL REUS W/TWL LRG LVL3 (GOWN DISPOSABLE) ×4
LENS IOL DIOP 22.5 (Intraocular Lens) ×3 IMPLANT
LENS IOL TECNIS MONO 22.5 (Intraocular Lens) IMPLANT
MARKER SKIN DUAL TIP RULER LAB (MISCELLANEOUS) ×3 IMPLANT
NDL CAPSULORHEX 25GA (NEEDLE) ×1 IMPLANT
NDL FILTER BLUNT 18X1 1/2 (NEEDLE) ×2 IMPLANT
NDL RETROBULBAR .5 NSTRL (NEEDLE) IMPLANT
NEEDLE CAPSULORHEX 25GA (NEEDLE) ×3 IMPLANT
NEEDLE FILTER BLUNT 18X 1/2SAF (NEEDLE) ×4
NEEDLE FILTER BLUNT 18X1 1/2 (NEEDLE) ×2 IMPLANT
PACK CATARACT BRASINGTON (MISCELLANEOUS) ×3 IMPLANT
PACK EYE AFTER SURG (MISCELLANEOUS) ×3 IMPLANT
PACK OPTHALMIC (MISCELLANEOUS) ×3 IMPLANT
RING MALYGIN 7.0 (MISCELLANEOUS) IMPLANT
SOLUTION OPHTHALMIC SALT (MISCELLANEOUS) ×3 IMPLANT
SUT ETHILON 10-0 CS-B-6CS-B-6 (SUTURE)
SUT VICRYL  9 0 (SUTURE)
SUT VICRYL 9 0 (SUTURE) IMPLANT
SUTURE EHLN 10-0 CS-B-6CS-B-6 (SUTURE) IMPLANT
SYR 3ML LL SCALE MARK (SYRINGE) ×6 IMPLANT
SYR TB 1ML LUER SLIP (SYRINGE) ×3 IMPLANT
WATER STERILE IRR 250ML POUR (IV SOLUTION) ×3 IMPLANT
WIPE NON LINTING 3.25X3.25 (MISCELLANEOUS) ×3 IMPLANT

## 2019-10-22 NOTE — Anesthesia Procedure Notes (Signed)
Procedure Name: MAC Date/Time: 10/22/2019 10:18 AM Performed by: Jeannene Patella, CRNA Pre-anesthesia Checklist: Patient identified, Emergency Drugs available, Suction available, Patient being monitored and Timeout performed Patient Re-evaluated:Patient Re-evaluated prior to induction Oxygen Delivery Method: Nasal cannula

## 2019-10-22 NOTE — Anesthesia Preprocedure Evaluation (Addendum)
Anesthesia Evaluation  Patient identified by MRN, date of birth, ID band Patient awake    Reviewed: Allergy & Precautions, NPO status , Patient's Chart, lab work & pertinent test results  History of Anesthesia Complications Negative for: history of anesthetic complications  Airway Mallampati: II  TM Distance: >3 FB Neck ROM: full    Dental no notable dental hx.    Pulmonary neg pulmonary ROS, former smoker,    Pulmonary exam normal        Cardiovascular Exercise Tolerance: Good hypertension, Normal cardiovascular exam+ dysrhythmias (on flecainide) Atrial Fibrillation  Carotid bruit:      Neuro/Psych PSYCHIATRIC DISORDERS Depression negative neurological ROS     GI/Hepatic Neg liver ROS, GERD  Controlled,  Endo/Other  Morbid obesity (bmi 29)  Renal/GU negative Renal ROS  negative genitourinary   Musculoskeletal  (+) Arthritis , Osteoarthritis,    Abdominal   Peds negative pediatric ROS (+)  Hematology negative hematology ROS (+)   Anesthesia Other Findings 01/2017 Echo: EF 60-65%, no rwma, mild MR, mildly to mod dil LA. nl RV fxn;  Last xarelto: 3/16;  Covid: NEG.   Reproductive/Obstetrics                            Anesthesia Physical  Anesthesia Plan  ASA: II  Anesthesia Plan: MAC   Post-op Pain Management:    Induction: Intravenous  PONV Risk Score and Plan: 2 and TIVA and Midazolam  Airway Management Planned: Nasal Cannula  Additional Equipment:   Intra-op Plan:   Post-operative Plan:   Informed Consent: I have reviewed the patients History and Physical, chart, labs and discussed the procedure including the risks, benefits and alternatives for the proposed anesthesia with the patient or authorized representative who has indicated his/her understanding and acceptance.       Plan Discussed with: CRNA  Anesthesia Plan Comments:         Anesthesia Quick  Evaluation

## 2019-10-22 NOTE — Op Note (Signed)
OPERATIVE NOTE  Romaine Kemmer [id number] 10/22/2019   PREOPERATIVE DIAGNOSIS:  Nuclear sclerotic cataract left eye. H25.12   POSTOPERATIVE DIAGNOSIS:    Nuclear sclerotic cataract left eye.     PROCEDURE:  Phacoemusification with posterior chamber intraocular lens placement of the left eye  Ultrasound time: Procedure(s): CATARACT EXTRACTION PHACO AND INTRAOCULAR LENS PLACEMENT (IOC) LEFT 4.96 00:49.0 10.1% (Left)  LENS:   Implant Name Type Inv. Item Serial No. Manufacturer Lot No. LRB No. Used Action  LENS IOL DIOP 22.5 - [id number] Intraocular Lens LENS IOL DIOP 22.5 [id number]  Left 1 Implanted      SURGEON:  Wyonia Hough, MD   ANESTHESIA:  Topical with tetracaine drops and 2% Xylocaine jelly, augmented with 1% preservative-free intracameral lidocaine.    COMPLICATIONS:  None.   DESCRIPTION OF PROCEDURE:  The patient was identified in the holding room and transported to the operating room and placed in the supine position under the operating microscope.  The left eye was identified as the operative eye and it was prepped and draped in the usual sterile ophthalmic fashion.   A 1 millimeter clear-corneal paracentesis was made at the 1:30 position.  0.5 ml of preservative-free 1% lidocaine was injected into the anterior chamber.  The anterior chamber was filled with Viscoat viscoelastic.  A 2.4 millimeter keratome was used to make a near-clear corneal incision at the 10:30 position.  .  A curvilinear capsulorrhexis was made with a cystotome and capsulorrhexis forceps.  Balanced salt solution was used to hydrodissect and hydrodelineate the nucleus.   Phacoemulsification was then used in stop and chop fashion to remove the lens nucleus and epinucleus.  The remaining cortex was then removed using the irrigation and aspiration handpiece. Provisc was then placed into the capsular bag to distend it for lens placement.  A lens was then injected into the capsular bag.  The  remaining viscoelastic was aspirated.   Wounds were hydrated with balanced salt solution.  The anterior chamber was inflated to a physiologic pressure with balanced salt solution.  No wound leaks were noted. Vigamox 0.2 ml of a 1mg  per ml solution was injected into the anterior chamber for a dose of 0.2 mg of intracameral antibiotic at the completion of the case.   Timolol and Brimonidine drops were applied to the eye.  The patient was taken to the recovery room in stable condition without complications of anesthesia or surgery.  Danarius Mcconathy 10/22/2019, 10:39 AM

## 2019-10-22 NOTE — Anesthesia Postprocedure Evaluation (Signed)
Anesthesia Post Note  Patient: Katherine Hudson  Procedure(s) Performed: CATARACT EXTRACTION PHACO AND INTRAOCULAR LENS PLACEMENT (IOC) LEFT 4.96 00:49.0 10.1% (Left Eye)     Patient location during evaluation: PACU Anesthesia Type: MAC Level of consciousness: awake and alert Pain management: pain level controlled Vital Signs Assessment: post-procedure vital signs reviewed and stable Respiratory status: spontaneous breathing, nonlabored ventilation, respiratory function stable and patient connected to nasal cannula oxygen Cardiovascular status: stable and blood pressure returned to baseline Postop Assessment: no apparent nausea or vomiting Anesthetic complications: no    Takeru Bose

## 2019-10-22 NOTE — H&P (Signed)

## 2019-10-22 NOTE — Transfer of Care (Signed)
Immediate Anesthesia Transfer of Care Note  Patient: Katherine Hudson  Procedure(s) Performed: CATARACT EXTRACTION PHACO AND INTRAOCULAR LENS PLACEMENT (IOC) LEFT (Left Eye)  Patient Location: PACU  Anesthesia Type: MAC  Level of Consciousness: awake, alert  and patient cooperative  Airway and Oxygen Therapy: Patient Spontanous Breathing and Patient connected to supplemental oxygen  Post-op Assessment: Post-op Vital signs reviewed, Patient's Cardiovascular Status Stable, Respiratory Function Stable, Patent Airway and No signs of Nausea or vomiting  Post-op Vital Signs: Reviewed and stable  Complications: No apparent anesthesia complications

## 2019-10-23 ENCOUNTER — Encounter: Payer: Self-pay | Admitting: *Deleted

## 2019-10-30 ENCOUNTER — Ambulatory Visit: Payer: Medicare Other | Attending: Internal Medicine

## 2019-10-30 DIAGNOSIS — Z23 Encounter for immunization: Secondary | ICD-10-CM

## 2019-10-30 NOTE — Progress Notes (Signed)
   Covid-19 Vaccination Clinic  Name:  Levonda Gepford    MRN: [id number] DOB: Dec 26, 1945  10/30/2019  Ms. Frerking was observed post Covid-19 immunization for 15 minutes without incident. She was provided with Vaccine Information Sheet and instruction to access the V-Safe system.   Ms. Burgeson was instructed to call 911 with any severe reactions post vaccine: Marland Kitchen Difficulty breathing  . Swelling of face and throat  . A fast heartbeat  . A bad rash all over body  . Dizziness and weakness   Immunizations Administered    Name Date Dose VIS Date Route   Moderna COVID-19 Vaccine 10/30/2019 12:20 PM 0.5 mL 07/08/2019 Intramuscular   Manufacturer: Moderna   Lot: [id number]   [place]:[id number]

## 2019-11-18 ENCOUNTER — Other Ambulatory Visit: Payer: Self-pay | Admitting: Internal Medicine

## 2019-11-18 DIAGNOSIS — Z1231 Encounter for screening mammogram for malignant neoplasm of breast: Secondary | ICD-10-CM

## 2019-11-20 ENCOUNTER — Ambulatory Visit
Admission: RE | Admit: 2019-11-20 | Discharge: 2019-11-20 | Disposition: A | Discharge status: Home / Self Care | Payer: Medicare Other | Source: Ambulatory Visit | Attending: Internal Medicine | Admitting: Internal Medicine

## 2019-11-20 DIAGNOSIS — Z1231 Encounter for screening mammogram for malignant neoplasm of breast: Secondary | ICD-10-CM | POA: Insufficient documentation

## 2019-12-02 ENCOUNTER — Ambulatory Visit: Payer: Medicare Other | Attending: Internal Medicine

## 2019-12-02 DIAGNOSIS — Z23 Encounter for immunization: Secondary | ICD-10-CM

## 2019-12-02 NOTE — Progress Notes (Signed)
   Covid-19 Vaccination Clinic  Name:  Katherine Hudson    MRN: [id number] DOB: 1946/05/27  12/02/2019  [name] was observed post Covid-19 immunization for 15 minutes without incident. She was provided with Vaccine Information Sheet and instruction to access the V-Safe system.   [name] was instructed to call 911 with any severe reactions post vaccine: Marland Kitchen Difficulty breathing  . Swelling of face and throat  . A fast heartbeat  . A bad rash all over body  . Dizziness and weakness   Immunizations Administered    Name Date Dose VIS Date Route   Moderna COVID-19 Vaccine 12/02/2019 12:05 PM 0.5 mL 07/2019 Intramuscular   Manufacturer: Moderna   Lot: [id number]   [place]:[id number]

## 2019-12-12 ENCOUNTER — Telehealth: Payer: Self-pay

## 2019-12-12 ENCOUNTER — Telehealth: Payer: Self-pay | Admitting: Internal Medicine

## 2019-12-12 DIAGNOSIS — E782 Mixed hyperlipidemia: Secondary | ICD-10-CM

## 2019-12-12 DIAGNOSIS — E034 Atrophy of thyroid (acquired): Secondary | ICD-10-CM

## 2019-12-12 DIAGNOSIS — I10 Essential (primary) hypertension: Secondary | ICD-10-CM

## 2019-12-12 DIAGNOSIS — R7303 Prediabetes: Secondary | ICD-10-CM

## 2019-12-12 DIAGNOSIS — R5383 Other fatigue: Secondary | ICD-10-CM

## 2019-12-12 NOTE — Telephone Encounter (Signed)
Patient calling  Patient would like to see Dr Caryl Comes before May 20th, she will be moving  Would like to know if we can add her on schedule  Please advise or call to discuss

## 2019-12-12 NOTE — Telephone Encounter (Signed)
Pt is due for 6 month lab work before her appt on 12/18/2019. I have ordered TSH, A1c, CBC, CMP, lipid panel, microalbumin. Is there anything else that needs to be ordered?

## 2019-12-15 NOTE — Telephone Encounter (Signed)
LMOV to call and discuss

## 2019-12-15 NOTE — Telephone Encounter (Signed)
I don't have any spots to add on for [place] prior to 12/25/19. The only thing I can see is a Virtual Visit on 12/25/19 at 3:15 pm on the [facility] schedule.

## 2019-12-16 ENCOUNTER — Other Ambulatory Visit: Payer: Self-pay

## 2019-12-16 ENCOUNTER — Other Ambulatory Visit (INDEPENDENT_AMBULATORY_CARE_PROVIDER_SITE_OTHER): Payer: Medicare Other

## 2019-12-16 DIAGNOSIS — R5383 Other fatigue: Secondary | ICD-10-CM

## 2019-12-16 DIAGNOSIS — E034 Atrophy of thyroid (acquired): Secondary | ICD-10-CM | POA: Diagnosis not present

## 2019-12-16 DIAGNOSIS — R7303 Prediabetes: Secondary | ICD-10-CM

## 2019-12-16 DIAGNOSIS — E782 Mixed hyperlipidemia: Secondary | ICD-10-CM

## 2019-12-16 DIAGNOSIS — I10 Essential (primary) hypertension: Secondary | ICD-10-CM

## 2019-12-16 LAB — COMPREHENSIVE METABOLIC PANEL
ALT: 12 U/L (ref 0–35)
AST: 15 U/L (ref 0–37)
Albumin: 4.2 g/dL (ref 3.5–5.2)
Alkaline Phosphatase: 55 U/L (ref 39–117)
BUN: 25 mg/dL — ABNORMAL HIGH (ref 6–23)
CO2: 25 mEq/L (ref 19–32)
Calcium: 9.4 mg/dL (ref 8.4–10.5)
Chloride: 106 mEq/L (ref 96–112)
Creatinine, Ser: 0.89 mg/dL (ref 0.40–1.20)
GFR: 62.01 mL/min (ref >60.00)
Glucose, Bld: 114 mg/dL — ABNORMAL HIGH (ref 70–99)
Potassium: 4.2 mEq/L (ref 3.5–5.1)
Sodium: 140 mEq/L (ref 135–145)
Total Bilirubin: 0.8 mg/dL (ref 0.2–1.2)
Total Protein: 6.9 g/dL (ref 6.0–8.3)

## 2019-12-16 LAB — CBC WITH DIFFERENTIAL/PLATELET
Basophils Absolute: 0.1 10*3/uL (ref 0.0–0.1)
Basophils Relative: 1 % (ref 0.0–3.0)
Eosinophils Absolute: 0.3 10*3/uL (ref 0.0–0.7)
Eosinophils Relative: 4.1 % (ref 0.0–5.0)
HCT: 43 % (ref 36.0–46.0)
Hemoglobin: 14.5 g/dL (ref 12.0–15.0)
Lymphocytes Relative: 31.9 % (ref 12.0–46.0)
Lymphs Abs: 2 10*3/uL (ref 0.7–4.0)
MCHC: 33.7 g/dL (ref 30.0–36.0)
MCV: 85.2 fl (ref 78.0–100.0)
Monocytes Absolute: 0.4 10*3/uL (ref 0.1–1.0)
Monocytes Relative: 6.5 % (ref 3.0–12.0)
Neutro Abs: 3.5 10*3/uL (ref 1.4–7.7)
Neutrophils Relative %: 56.5 % (ref 43.0–77.0)
Platelets: 178 10*3/uL (ref 150.0–400.0)
RBC: 5.04 Mil/uL (ref 3.87–5.11)
RDW: 14.1 % (ref 11.5–15.5)
WBC: 6.2 10*3/uL (ref 4.0–10.5)

## 2019-12-16 LAB — MICROALBUMIN / CREATININE URINE RATIO
Creatinine,U: 152.8 mg/dL
Microalb Creat Ratio: 0.9 mg/g (ref 0.0–30.0)
Microalb, Ur: 1.4 mg/dL (ref 0.0–1.9)

## 2019-12-16 LAB — LIPID PANEL
Cholesterol: 200 mg/dL (ref 0–200)
HDL: 36.8 mg/dL — ABNORMAL LOW (ref >39.00)
NonHDL: 163.23
Total CHOL/HDL Ratio: 5
Triglycerides: 202 mg/dL — ABNORMAL HIGH (ref 0.0–149.0)
VLDL: 40.4 mg/dL — ABNORMAL HIGH (ref 0.0–40.0)

## 2019-12-16 LAB — LDL CHOLESTEROL, DIRECT: Direct LDL: 123 mg/dL

## 2019-12-16 LAB — TSH: TSH: 3.35 u[IU]/mL (ref 0.35–4.50)

## 2019-12-16 LAB — HEMOGLOBIN A1C: Hgb A1c MFr Bld: 5.8 % (ref 4.6–6.5)

## 2019-12-18 ENCOUNTER — Encounter: Payer: Self-pay | Admitting: Internal Medicine

## 2019-12-18 ENCOUNTER — Other Ambulatory Visit: Payer: Self-pay

## 2019-12-18 ENCOUNTER — Ambulatory Visit (INDEPENDENT_AMBULATORY_CARE_PROVIDER_SITE_OTHER): Payer: Medicare Other | Admitting: Internal Medicine

## 2019-12-18 DIAGNOSIS — I10 Essential (primary) hypertension: Secondary | ICD-10-CM | POA: Diagnosis not present

## 2019-12-18 DIAGNOSIS — F324 Major depressive disorder, single episode, in partial remission: Secondary | ICD-10-CM

## 2019-12-18 DIAGNOSIS — I484 Atypical atrial flutter: Secondary | ICD-10-CM | POA: Diagnosis not present

## 2019-12-18 DIAGNOSIS — E782 Mixed hyperlipidemia: Secondary | ICD-10-CM | POA: Diagnosis not present

## 2019-12-18 DIAGNOSIS — R7303 Prediabetes: Secondary | ICD-10-CM

## 2019-12-18 DIAGNOSIS — M1712 Unilateral primary osteoarthritis, left knee: Secondary | ICD-10-CM | POA: Diagnosis not present

## 2019-12-18 DIAGNOSIS — M791 Myalgia, unspecified site: Secondary | ICD-10-CM

## 2019-12-18 DIAGNOSIS — E034 Atrophy of thyroid (acquired): Secondary | ICD-10-CM | POA: Diagnosis not present

## 2019-12-18 DIAGNOSIS — T466X5A Adverse effect of antihyperlipidemic and antiarteriosclerotic drugs, initial encounter: Secondary | ICD-10-CM

## 2019-12-18 MED ORDER — ZOSTER VAC RECOMB ADJUVANTED 50 MCG/0.5ML IM SUSR: 0.5000 mL | Freq: Once | INTRAMUSCULAR | 1 refills | Status: AC

## 2019-12-18 NOTE — Patient Instructions (Addendum)
Keep an eye on your blood pressure  .  If it starts to stay above 130/80, let me know.  You should be taking telmisartan 80 mg daily   The ShingRx vaccine is now available in local pharmacies and is much more protective than the old one  Zostavax  (it is about 97%  Effective in preventing shingles). .   It is therefore ADVISED for all interested adults over 50 to prevent shingles so I have printed you a prescription for it.  (it requires a 2nd dose 2 to 6 months after the first one) .  It will cause you to have flu  like symptoms for 2 days  Hylan G-F 20 intra-articular injection What is this medicine? HYLAN G-F 20 (HI lan G F 20) is used to treat osteoarthritis of the knee. It lubricates and cushions the joint, reducing pain in the knee. This medicine may be used for other purposes; ask your health care provider or pharmacist if you have questions. COMMON BRAND NAME(S): Synvisc, Synvisc-One What should I tell my health care provider before I take this medicine? They need to know if you have any of these conditions:  severe knee inflammation  skin conditions or sensitivity  skin or joint infection  venous stasis  an unusual or allergic reaction to hylan G-F 20, hyaluronan (sodium hyaluronate), eggs, other medicines, foods, dyes, or preservatives  pregnant or trying to get pregnant  breast-feeding How should I use this medicine? This medicine is for injection into the knee joint. It is given by a health care professional in a hospital or clinic setting. Talk to your pediatrician regarding the use of this medicine in children. This medicine is not approved for use in children. Overdosage: If you think you have taken too much of this medicine contact a poison control center or emergency room at once. NOTE: This medicine is only for you. Do not share this medicine with others. What if I miss a dose? Keep appointments for follow-up doses as directed. For Synvisc, you will need weekly  injections for 3 doses. It is important not to miss your dose. If you will receive Synvisc-One, then only 1 injection will be needed. Call your doctor or health care professional if you are unable to keep an appointment. What may interact with this medicine? Do not take this medicine with any of the following medications:  other injections for the joint like steroids or anesthetics  certain skin disinfectants like benzalkonium chloride This list may not describe all possible interactions. Give your health care provider a list of all the medicines, herbs, non-prescription drugs, or dietary supplements you use. Also tell them if you smoke, drink alcohol, or use illegal drugs. Some items may interact with your medicine. What should I watch for while using this medicine? Tell your doctor or healthcare professional if your symptoms do not start to get better or if they get worse. Your condition will be monitored carefully while you are receiving this medicine. Most persons get pain relief for up to 6 months after treatment. Avoid strenuous activities (high-impact sports, jogging) or major weight-bearing activities for 48 hours after the injection. What side effects may I notice from receiving this medicine? Side effects that you should report to your doctor or health care professional as soon as possible:  allergic reactions like skin rash, itching or hives, swelling of the face, lips, or tongue  difficulty breathing  fever or chills  severe joint pain or swelling  unusual bleeding or  bruising Side effects that usually do not require medical attention (report to your doctor or health care professional if they continue or are bothersome):  dizziness  flushing  general ill feeling or flu-like symptoms  headache  minor joint pain or swelling  muscle pain or cramps  pain, redness, irritation or bruising at site of injection This list may not describe all possible side effects. Call your  doctor for medical advice about side effects. You may report side effects to FDA at 1-800-FDA-1088. Where should I keep my medicine? This drug is given in a hospital or clinic and will not be stored at home. NOTE: This sheet is a summary. It may not cover all possible information. If you have questions about this medicine, talk to your doctor, pharmacist, or health care provider.  2020 Elsevier/Gold Standard (2015-08-26 11:48:41)

## 2019-12-18 NOTE — Progress Notes (Signed)
Subjective:  Patient ID: Katherine Hudson, female    DOB: 10/22/1945  Age: 74 y.o. MRN: [id number]  CC: Diagnoses of Primary osteoarthritis of left knee, Essential hypertension, Atypical atrial flutter ([place]), Hypothyroidism due to acquired atrophy of thyroid, Mixed hyperlipidemia, Myalgia due to statin, Prediabetes, and Major depressive disorder with single episode, in partial remission ([place]) were pertinent to this visit.  HPI Katherine Hudson presents for follow up on hypertension, hypothyroid  This visit occurred during the SARS-CoV-2 public health emergency.  Safety protocols were in place, including screening questions prior to the visit, additional usage of staff PPE, and extensive cleaning of exam room while observing appropriate contact time as indicated for disinfecting solutions.    Patient has received both doses of the available COVID 19 vaccine without complications.  Patient continues to mask when outside of the home except when walking in yard or at safe distances from others .  Patient denies any change in mood or development of unhealthy behaviors resuting from the pandemic's restriction of activities and socialization.     1) Hypertension: patient checks blood pressure twice weekly at home.  Readings have been labile, with most recent systolic low of 99 and high of 160 but for the most part < 140/80 at rest . Patient is stressed out currently due to major life changes and has not been sleeping well. She is following a reduce salt diet most days and is taking medications as prescribed.  She reports feeling anxious, irritable and tense,  House just sold,  Lots to do .   Relocating soon not sure where   2) Knee pain left getting worse due to DJD .  Surgery has been recommended but she is apprehensive,  But she is in constant mild to moderate pain aggravated by house renovation she is doin g now.   Outpatient Medications Prior to Visit  Medication Sig Dispense Refill  . APPLE  CIDER VINEGAR PO Take by mouth daily.    . Boswellia-Glucosamine-Vit D (OSTEO BI-FLEX ONE PER DAY PO) Take by mouth daily.    . cetirizine (ZYRTEC) 10 MG tablet Take 10 mg by mouth daily as needed for allergies.    . Cholecalciferol (VITAMIN D3) 1000 units CAPS Take 1,000 Units by mouth daily.    Marland Kitchen CINNAMON PO Take 2,000 mg by mouth daily.    . Citicoline (COGNITIVE HEALTH PO) Take by mouth daily.    . Coenzyme Q10 (CO Q-10) 100 MG CAPS Take 100 mg by mouth daily.    . Cranberry 200 MG CAPS Take 400 mg by mouth daily.     . diphenhydrAMINE (BENADRYL) 2 % cream Apply 1 application topically as needed (bug bites).    . ezetimibe (ZETIA) 10 MG tablet TAKE 1 TABLET BY MOUTH  DAILY 90 tablet 3  . flecainide (TAMBOCOR) 100 MG tablet Take 1 tablet (100 mg) by mouth twice daily 180 tablet 3  . fluticasone (FLONASE) 50 MCG/ACT nasal spray Place 2 sprays at bedtime into both nostrils.    . Melatonin 10 MG TABS Take 10 mg by mouth at bedtime as needed (sleep).    . mirabegron ER (MYRBETRIQ) 50 MG TB24 tablet Take 1 tablet (50 mg total) by mouth daily. 90 tablet 1  . Multiple Vitamin (MULTIVITAMIN) tablet Take 1 tablet by mouth daily.      . Naphazoline HCl (CLEAR EYES OP) Apply 1 drop to eye daily as needed (irritation).    . pramoxine (PROCTOFOAM) 1 % foam Place 1  application rectally 3 (three) times daily as needed for itching. 15 g 0  . Probiotic Product (PROBIOTIC PO) Take 1 tablet by mouth daily.    . Psyllium (METAMUCIL FIBER PO) Take by mouth.    . sertraline (ZOLOFT) 50 MG tablet TAKE 3 TABLETS BY MOUTH  DAILY 270 tablet 3  . sodium chloride (OCEAN) 0.65 % SOLN nasal spray Place 1 spray into both nostrils as needed for congestion.    Marland Kitchen telmisartan (MICARDIS) 80 MG tablet TAKE 1 TABLET BY MOUTH  DAILY 90 tablet 3  . TURMERIC PO Take 1 capsule by mouth daily.    . verapamil (CALAN-SR) 120 MG CR tablet TAKE 1 TABLET BY MOUTH AT  BEDTIME 90 tablet 3  . Wheat Dextrin (BENEFIBER PO) Take by mouth  daily.    Alveda Reasons 20 MG TABS tablet TAKE 1 TABLET BY MOUTH ONCE DAILY WITH SUPPER 90 tablet 3   No facility-administered medications prior to visit.    Review of Systems;  Patient denies headache, fevers, malaise, unintentional weight loss, skin rash, eye pain, sinus congestion and sinus pain, sore throat, dysphagia,  hemoptysis , cough, dyspnea, wheezing, chest pain, palpitations, orthopnea, edema, abdominal pain, nausea, melena, diarrhea, constipation, flank pain, dysuria, hematuria, urinary  Frequency, nocturia, numbness, tingling, seizures,  Focal weakness, Loss of consciousness,  Tremor, insomnia, depression,and sui cidal ideation.      Objective:  BP (!) 156/68 (BP Location: Left Arm, Patient Position: Sitting, Cuff Size: Normal)   Pulse (!) 52   Temp (!) 97 F (36.1 C) (Temporal)   Resp 15   Ht 5\' 6"  (1.676 m)   Wt 180 lb 9.6 oz (81.9 kg)   SpO2 97%   BMI 29.15 kg/m   BP Readings from Last 3 Encounters:  12/18/19 (!) 156/68  10/22/19 (!) 103/58  10/01/19 110/65    Wt Readings from Last 3 Encounters:  12/18/19 180 lb 9.6 oz (81.9 kg)  10/22/19 181 lb 11.2 oz (82.4 kg)  10/01/19 180 lb (81.6 kg)    General appearance: alert, cooperative and appears stated age Ears: normal TM's and external ear canals both ears Throat: lips, mucosa, and tongue normal; teeth and gums normal Neck: no adenopathy, no carotid bruit, supple, symmetrical, trachea midline and thyroid not enlarged, symmetric, no tenderness/mass/nodules Back: symmetric, no curvature. ROM normal. No CVA tenderness. Lungs: clear to auscultation bilaterally Heart: regular rate and rhythm, S1, S2 normal, no murmur, click, rub or gallop Abdomen: soft, non-tender; bowel sounds normal; no masses,  no organomegaly Pulses: 2+ and symmetric Skin: Skin color, texture, turgor normal. No rashes or lesions Lymph nodes: Cervical, supraclavicular, and axillary nodes normal.  Lab Results  Component Value Date   HGBA1C  5.8 12/16/2019   HGBA1C 6.0 05/15/2019   HGBA1C 5.9 12/20/2018    Lab Results  Component Value Date   CREATININE 0.89 12/16/2019   CREATININE 0.89 05/15/2019   CREATININE 0.94 04/10/2019    Lab Results  Component Value Date   WBC 6.2 12/16/2019   HGB 14.5 12/16/2019   HCT 43.0 12/16/2019   PLT 178.0 12/16/2019   GLUCOSE 114 (H) 12/16/2019   CHOL 200 12/16/2019   TRIG 202.0 (H) 12/16/2019   HDL 36.80 (L) 12/16/2019   LDLDIRECT 123.0 12/16/2019   LDLCALC 160 (H) 05/15/2019   ALT 12 12/16/2019   AST 15 12/16/2019   NA 140 12/16/2019   K 4.2 12/16/2019   CL 106 12/16/2019   CREATININE 0.89 12/16/2019   BUN 25 (H) 12/16/2019  CO2 25 12/16/2019   TSH 3.35 12/16/2019   HGBA1C 5.8 12/16/2019   MICROALBUR 1.4 12/16/2019    MM 3D SCREEN BREAST BILATERAL  Result Date: 11/20/2019 CLINICAL DATA:  Screening. EXAM: DIGITAL SCREENING BILATERAL MAMMOGRAM WITH TOMO AND CAD COMPARISON:  Previous exam(s). ACR Breast Density Category c: The breast tissue is heterogeneously dense, which may obscure small masses. FINDINGS: There are no findings suspicious for malignancy. Images were processed with CAD. IMPRESSION: No mammographic evidence of malignancy. A result letter of this screening mammogram will be mailed directly to the patient. RECOMMENDATION: Screening mammogram in one year. (Code:SM-B-01Y) BI-RADS CATEGORY  1: Negative. Electronically Signed   By: Lovey Newcomer M.D.   On: 11/20/2019 16:38    Assessment & Plan:   Problem List Items Addressed This Visit      Unprioritized   Atrial flutter ([place])    Has been maintaining sinus rhythm with flecainide and veramapil.  Tolerating bradycardia.   Continue xarelto       Essential hypertension    she reports compliance with medication regimen  but has an elevated reading today in office.  She is not using NSAIDs daily.  Discussed goal of   (130/80 for patients over 70)  to preserve renal function.  She has been asked to check her  BP  at  home , confirm that she is taking 80 mg telmisartan, and  submit readings for evaluation. Renal function, electrolytes and screen for proteinuria are all normal .  Lab Results  Component Value Date   MICROALBUR 1.4 12/16/2019   MICROALBUR 1.4 12/20/2018     Lab Results  Component Value Date   CREATININE 0.89 12/16/2019   Lab Results  Component Value Date   NA 140 12/16/2019   K 4.2 12/16/2019   CL 106 12/16/2019   CO2 25 12/16/2019         RESOLVED: Hypothyroidism due to acquired atrophy of thyroid    Thyroid function is WNL on current dose.  No current changes needed.   Lab Results  Component Value Date   TSH 3.35 12/16/2019         Left knee DJD    She is in constant pain but apprehensive about surgery.  Discussed the Synvisc injections.       Major depressive disorder with single episode, in partial remission (H[facility])    Symptoms are currently  controlled with sertraline. Situational anxiety not meriting treatment change at this time.       Mixed hyperlipidemia    Historically Untreated due to persistent myalgias while taking simvastatin. Her 10 yr risk of events using FRC is 20%  . She is tolerating Zetia   Lab Results  Component Value Date   CHOL 200 12/16/2019   HDL 36.80 (L) 12/16/2019   LDLCALC 160 (H) 05/15/2019   LDLDIRECT 123.0 12/16/2019   TRIG 202.0 (H) 12/16/2019   CHOLHDL 5 12/16/2019   Lab Results  Component Value Date   ALT 12 12/16/2019   AST 15 12/16/2019   ALKPHOS 55 12/16/2019   BILITOT 0.8 12/16/2019           Myalgia due to statin   Prediabetes    Her A1c remains suggestive  Of Pre prediabetes .  I recommend she follow a low glycemic index diet and particpate regularly in an aerobic  exercise activity.    Lab Results  Component Value Date   HGBA1C 5.8 12/16/2019           I  am having Rosette Reveal start on Zoster Vaccine Adjuvanted. I am also having her maintain her multivitamin, TURMERIC PO, Probiotic Product (PROBIOTIC  PO), CINNAMON PO, Cranberry, pramoxine, Vitamin D3, Co Q-10, Melatonin, cetirizine, sodium chloride, Naphazoline HCl (CLEAR EYES OP), diphenhydrAMINE, fluticasone, Psyllium (METAMUCIL FIBER PO), mirabegron ER, sertraline, Xarelto, flecainide, ezetimibe, telmisartan, APPLE CIDER VINEGAR PO, Citicoline (COGNITIVE HEALTH PO), Wheat Dextrin (BENEFIBER PO), Boswellia-Glucosamine-Vit D (OSTEO BI-FLEX ONE PER DAY PO), and verapamil.  Meds ordered this encounter  Medications  . Zoster Vaccine Adjuvanted [facility]) injection    Sig: Inject 0.5 mLs into the muscle once for 1 dose.    Dispense:  1 each    Refill:  1    I provided  30 minutes of  face-to-face time during this encounter reviewing patient's current problems and past surgeries, labs and imaging studies, providing counseling on the above mentioned problems , and coordination  of care . There are no discontinued medications.  Follow-up: No follow-ups on file.   Crecencio Mc, MD

## 2019-12-20 ENCOUNTER — Encounter: Payer: Self-pay | Admitting: Internal Medicine

## 2019-12-20 DIAGNOSIS — T466X5A Adverse effect of antihyperlipidemic and antiarteriosclerotic drugs, initial encounter: Secondary | ICD-10-CM | POA: Insufficient documentation

## 2019-12-20 NOTE — Assessment & Plan Note (Signed)
Her A1c remains suggestive  Of Pre prediabetes .  I recommend she follow a low glycemic index diet and particpate regularly in an aerobic  exercise activity.    Lab Results  Component Value Date   HGBA1C 5.8 12/16/2019

## 2019-12-20 NOTE — Assessment & Plan Note (Signed)
She is in constant pain but apprehensive about surgery.  Discussed the Synvisc injections.

## 2019-12-20 NOTE — Assessment & Plan Note (Signed)
Historically Untreated due to persistent myalgias while taking simvastatin. Her 10 yr risk of events using FRC is 20%  . She is tolerating Zetia   Lab Results  Component Value Date   CHOL 200 12/16/2019   HDL 36.80 (L) 12/16/2019   LDLCALC 160 (H) 05/15/2019   LDLDIRECT 123.0 12/16/2019   TRIG 202.0 (H) 12/16/2019   CHOLHDL 5 12/16/2019   Lab Results  Component Value Date   ALT 12 12/16/2019   AST 15 12/16/2019   ALKPHOS 55 12/16/2019   BILITOT 0.8 12/16/2019

## 2019-12-20 NOTE — Assessment & Plan Note (Signed)
she reports compliance with medication regimen  but has an elevated reading today in office.  She is not using NSAIDs daily.  Discussed goal of   (130/80 for patients over 70)  to preserve renal function.  She has been asked to check her  BP  at home , confirm that she is taking 80 mg telmisartan, and  submit readings for evaluation. Renal function, electrolytes and screen for proteinuria are all normal .  Lab Results  Component Value Date   MICROALBUR 1.4 12/16/2019   MICROALBUR 1.4 12/20/2018     Lab Results  Component Value Date   CREATININE 0.89 12/16/2019   Lab Results  Component Value Date   NA 140 12/16/2019   K 4.2 12/16/2019   CL 106 12/16/2019   CO2 25 12/16/2019

## 2019-12-20 NOTE — Assessment & Plan Note (Addendum)
Has been maintaining sinus rhythm with flecainide and veramapil.  Tolerating bradycardia.   Continue xarelto

## 2019-12-20 NOTE — Assessment & Plan Note (Signed)
Thyroid function is WNL on current dose.  No current changes needed.   Lab Results  Component Value Date   TSH 3.35 12/16/2019

## 2019-12-20 NOTE — Assessment & Plan Note (Signed)
Symptoms are currently  controlled with sertraline. Situational anxiety not meriting treatment change at this time.

## 2020-01-28 ENCOUNTER — Other Ambulatory Visit: Payer: Self-pay

## 2020-01-28 MED ORDER — FLECAINIDE ACETATE 100 MG PO TABS: ORAL_TABLET | ORAL | 3 refills | Status: AC

## 2020-03-10 ENCOUNTER — Telehealth: Payer: Self-pay | Admitting: Internal Medicine

## 2020-03-10 NOTE — Telephone Encounter (Signed)
Pt stated that she is faxing a application for tinted window

## 2020-03-11 NOTE — Telephone Encounter (Signed)
Placed in red folder  

## 2020-03-15 NOTE — Telephone Encounter (Signed)
I know of no medical reason that requires this state of [place] waiver to allow tinted windows on her car! .  She should request It from her eye doctor, not me

## 2020-03-16 NOTE — Telephone Encounter (Signed)
Spoke with pt to let her know that the form will need to be sent to her eye doctor for completion. Pt gave a verbal understanding and stated that she would fax them the form.

## 2020-03-17 ENCOUNTER — Other Ambulatory Visit: Payer: Self-pay | Admitting: Internal Medicine

## 2020-03-17 DIAGNOSIS — E782 Mixed hyperlipidemia: Secondary | ICD-10-CM

## 2020-05-12 ENCOUNTER — Telehealth: Payer: Self-pay | Admitting: Internal Medicine

## 2020-05-12 NOTE — Telephone Encounter (Signed)
-----   Message from Salvatore Marvel sent at 05/11/2020  3:11 PM EDT ----- Regarding: remove pcp Pt moved to [place].  Thanks

## 2020-05-12 NOTE — Telephone Encounter (Signed)
PCP removed. 

## 2020-05-17 ENCOUNTER — Ambulatory Visit: Payer: Medicare Other

## 2020-06-01 ENCOUNTER — Ambulatory Visit: Payer: Medicare Other | Admitting: Internal Medicine

## 2020-06-09 ENCOUNTER — Ambulatory Visit: Payer: Medicare Other | Admitting: Internal Medicine

## 2020-09-27 ENCOUNTER — Telehealth: Payer: Self-pay | Admitting: Internal Medicine

## 2020-09-27 NOTE — Telephone Encounter (Signed)
Patient has moved to [facility] Recall deleted

## 2020-10-10 IMAGING — MG DIGITAL SCREENING BILAT W/ TOMO W/ CAD
8 series · 8 of 24 positions shown · non-contrast
Comparison: Previous exam(s).

CLINICAL DATA: Screening.

EXAM:
DIGITAL SCREENING BILATERAL MAMMOGRAM WITH TOMO AND CAD

[R CC synth-2D]
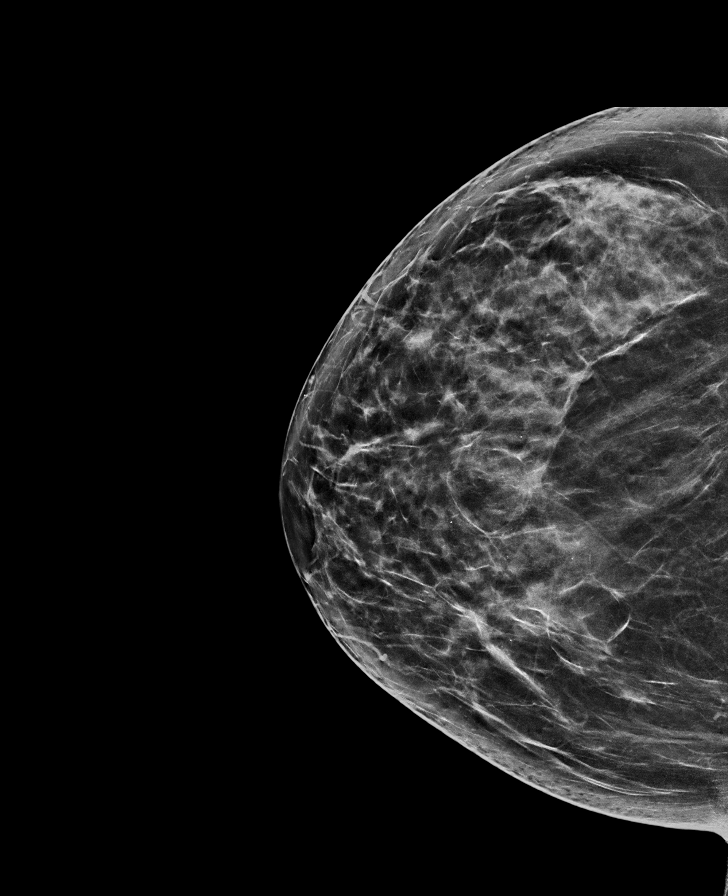

[R MLO synth-2D]
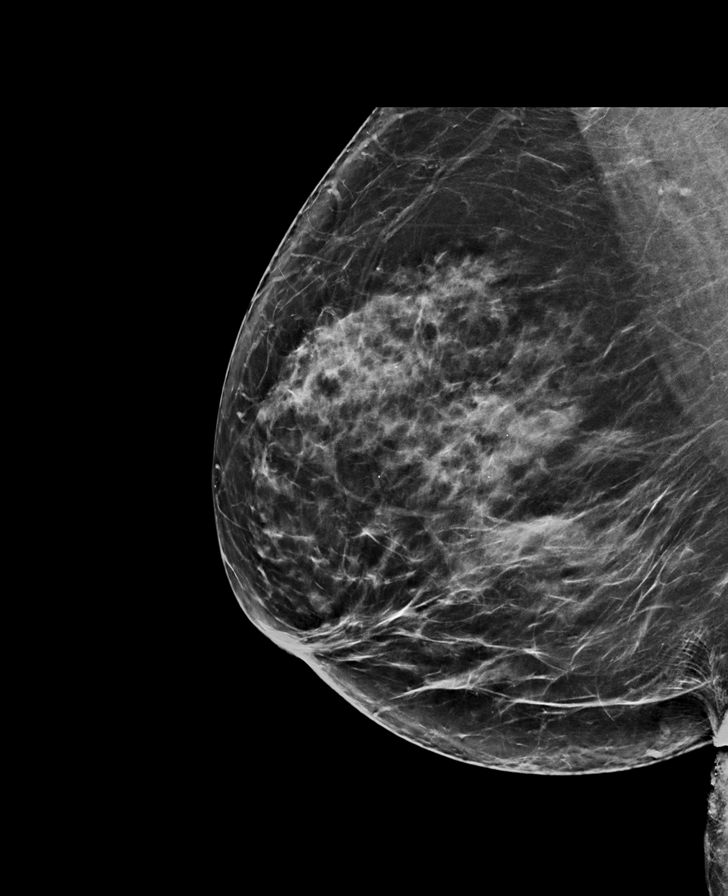

[L CC synth-2D]
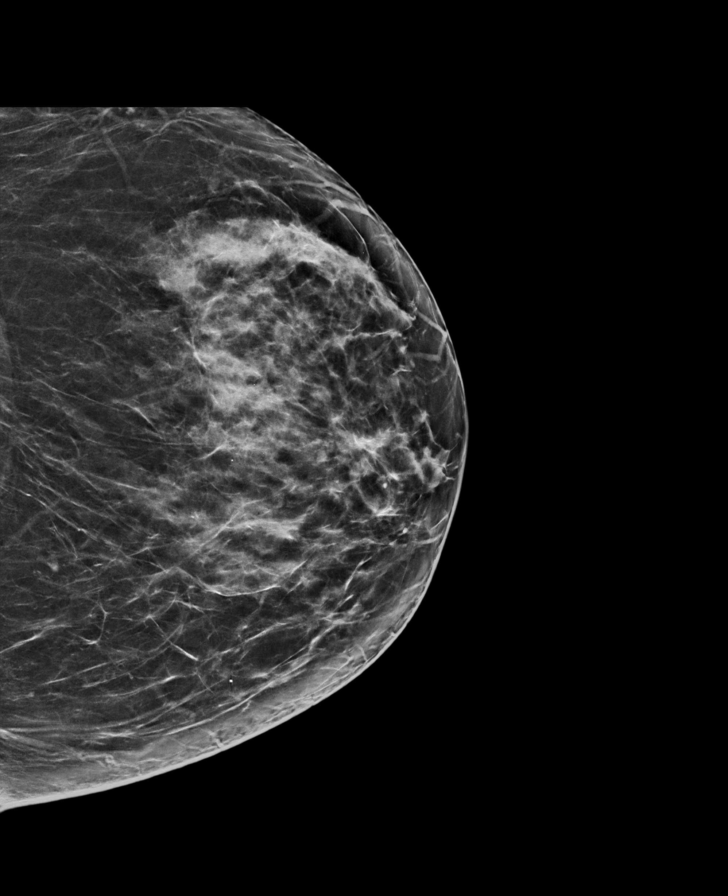

[L MLO synth-2D]
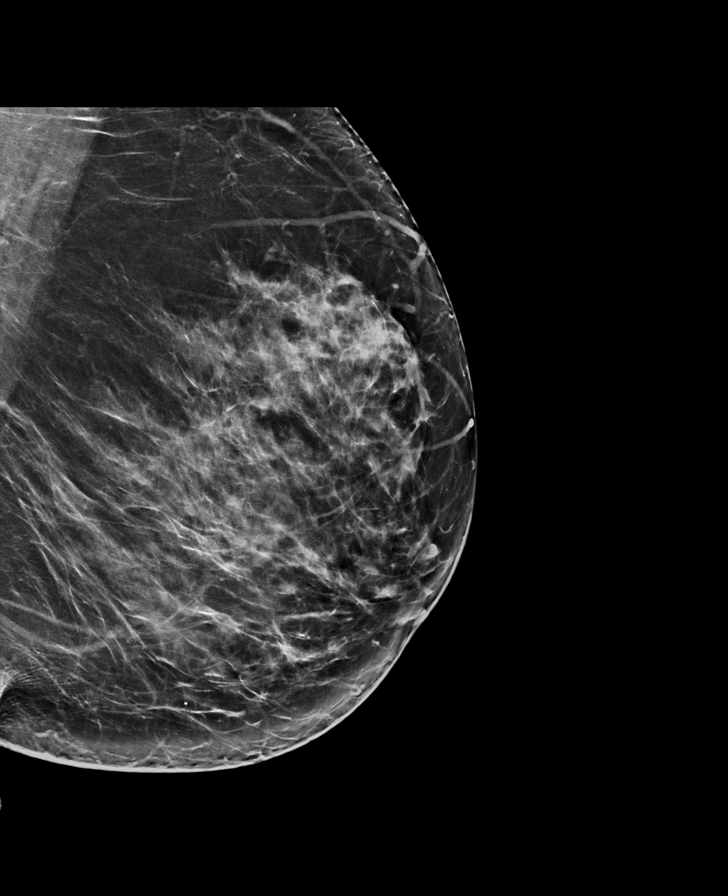

[R CC tomo · tomo slice 41/81.0]
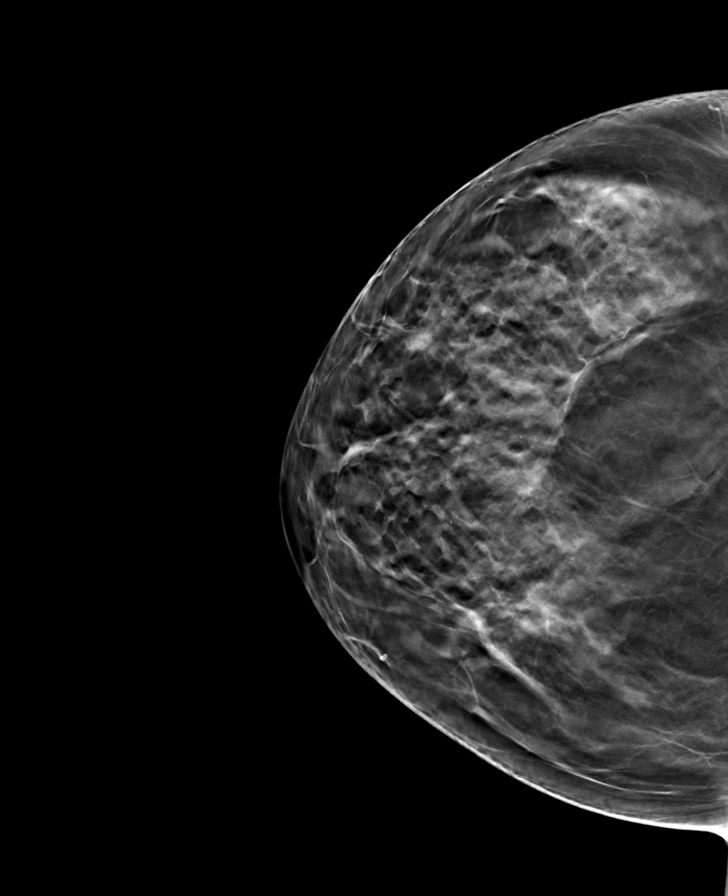

[L CC tomo · tomo slice 40/79.0]
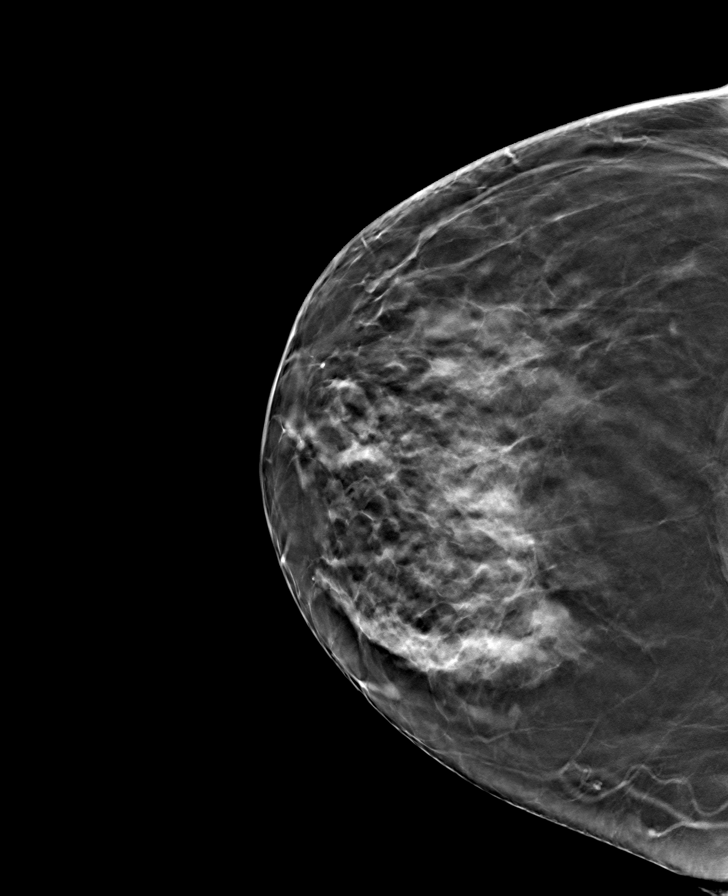

[L MLO tomo · tomo slice 41/82.0]
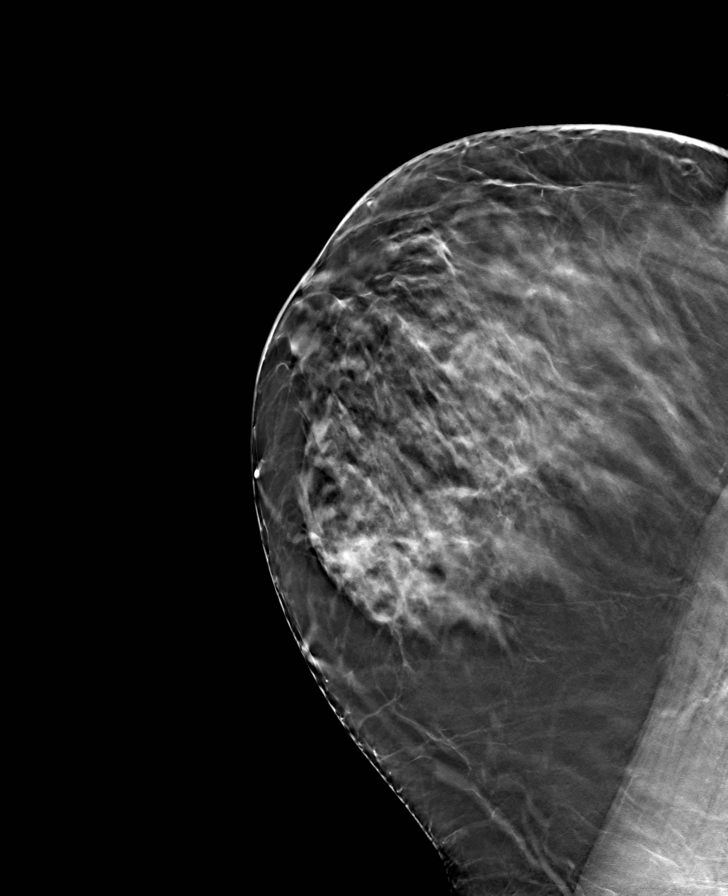

[R MLO tomo · tomo slice 42/83.0]
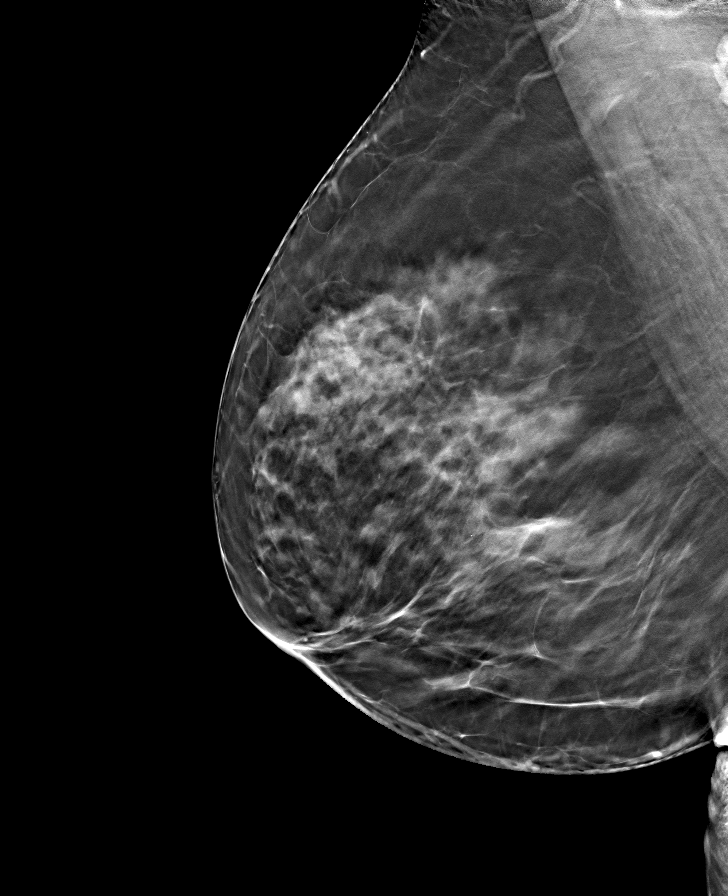

[8 of 24 positions shown; findings below may reference images not displayed]

ACR Breast Density Category c: The breast tissue is heterogeneously
dense, which may obscure small masses.
FINDINGS: There are no findings suspicious for malignancy. Images were
processed with CAD.
IMPRESSION: No mammographic evidence of malignancy. A result letter of this
screening mammogram will be mailed directly to the patient.

RECOMMENDATION:
Screening mammogram in one year. (Code:[id number])

BI-RADS CATEGORY  1: Negative.

## 2022-07-19 NOTE — Telephone Encounter (Signed)
MyChart messgae sent to patient.
